# Patient Record
Sex: Male | Born: 1960
Health system: Southern US, Community
[De-identification: ages and names within clinical notes are randomized; demographics above are authoritative.]

## PROBLEM LIST (undated history)

## (undated) DIAGNOSIS — E119 Type 2 diabetes mellitus without complications: Secondary | ICD-10-CM

## (undated) DIAGNOSIS — N189 Chronic kidney disease, unspecified: Secondary | ICD-10-CM

## (undated) DIAGNOSIS — I429 Cardiomyopathy, unspecified: Secondary | ICD-10-CM

## (undated) DIAGNOSIS — A419 Sepsis, unspecified organism: Secondary | ICD-10-CM

## (undated) DIAGNOSIS — W3400XA Accidental discharge from unspecified firearms or gun, initial encounter: Secondary | ICD-10-CM

## (undated) DIAGNOSIS — D649 Anemia, unspecified: Secondary | ICD-10-CM

## (undated) DIAGNOSIS — L97409 Non-pressure chronic ulcer of unspecified heel and midfoot with unspecified severity: Secondary | ICD-10-CM

## (undated) DIAGNOSIS — I10 Essential (primary) hypertension: Secondary | ICD-10-CM

## (undated) DIAGNOSIS — I739 Peripheral vascular disease, unspecified: Secondary | ICD-10-CM

## (undated) DIAGNOSIS — K8020 Calculus of gallbladder without cholecystitis without obstruction: Secondary | ICD-10-CM

## (undated) DIAGNOSIS — M869 Osteomyelitis, unspecified: Secondary | ICD-10-CM

## (undated) DIAGNOSIS — Z89432 Acquired absence of left foot: Secondary | ICD-10-CM

## (undated) DIAGNOSIS — E785 Hyperlipidemia, unspecified: Secondary | ICD-10-CM

## (undated) HISTORY — DX: Hyperlipidemia, unspecified: E78.5

## (undated) HISTORY — PX: OTHER SURGICAL HISTORY: SHX169

## (undated) HISTORY — DX: Sepsis, unspecified organism: A41.9

## (undated) HISTORY — DX: Calculus of gallbladder without cholecystitis without obstruction: K80.20

## (undated) HISTORY — PX: COLOSTOMY CLOSURE: SHX1381

## (undated) HISTORY — DX: Acquired absence of left foot: Z89.432

## (undated) HISTORY — PX: COLOSTOMY: SHX63

---

## 2000-06-03 ENCOUNTER — Inpatient Hospital Stay (HOSPITAL_COMMUNITY): Admission: EM | Admit: 2000-06-03 | Discharge: 2000-06-06 | Payer: Self-pay

## 2000-06-03 ENCOUNTER — Encounter: Payer: Self-pay | Admitting: Emergency Medicine

## 2000-06-04 ENCOUNTER — Encounter: Payer: Self-pay | Admitting: General Surgery

## 2000-06-15 ENCOUNTER — Ambulatory Visit (HOSPITAL_BASED_OUTPATIENT_CLINIC_OR_DEPARTMENT_OTHER): Admission: RE | Admit: 2000-06-15 | Discharge: 2000-06-15 | Payer: Self-pay | Admitting: Specialist

## 2002-01-12 ENCOUNTER — Emergency Department (HOSPITAL_COMMUNITY): Admission: EM | Admit: 2002-01-12 | Discharge: 2002-01-12 | Payer: Self-pay | Admitting: Emergency Medicine

## 2002-01-17 ENCOUNTER — Emergency Department (HOSPITAL_COMMUNITY): Admission: EM | Admit: 2002-01-17 | Discharge: 2002-01-17 | Payer: Self-pay | Admitting: *Deleted

## 2002-01-22 ENCOUNTER — Emergency Department (HOSPITAL_COMMUNITY): Admission: EM | Admit: 2002-01-22 | Discharge: 2002-01-23 | Payer: Self-pay | Admitting: *Deleted

## 2002-01-22 ENCOUNTER — Encounter: Payer: Self-pay | Admitting: *Deleted

## 2002-05-23 ENCOUNTER — Ambulatory Visit (HOSPITAL_COMMUNITY): Admission: RE | Admit: 2002-05-23 | Discharge: 2002-05-23 | Payer: Self-pay | Admitting: Neurology

## 2006-02-17 ENCOUNTER — Emergency Department (HOSPITAL_COMMUNITY): Admission: EM | Admit: 2006-02-17 | Discharge: 2006-02-17 | Payer: Self-pay | Admitting: Emergency Medicine

## 2006-02-21 ENCOUNTER — Emergency Department (HOSPITAL_COMMUNITY): Admission: EM | Admit: 2006-02-21 | Discharge: 2006-02-21 | Payer: Self-pay | Admitting: Emergency Medicine

## 2006-03-02 ENCOUNTER — Inpatient Hospital Stay (HOSPITAL_COMMUNITY): Admission: EM | Admit: 2006-03-02 | Discharge: 2006-03-12 | Payer: Self-pay | Admitting: Emergency Medicine

## 2006-03-02 ENCOUNTER — Ambulatory Visit: Payer: Self-pay | Admitting: Internal Medicine

## 2006-03-17 ENCOUNTER — Ambulatory Visit: Payer: Self-pay | Admitting: Family Medicine

## 2006-04-22 ENCOUNTER — Ambulatory Visit: Payer: Self-pay | Admitting: Sports Medicine

## 2006-06-01 ENCOUNTER — Ambulatory Visit: Payer: Self-pay | Admitting: Sports Medicine

## 2006-07-13 ENCOUNTER — Ambulatory Visit: Payer: Self-pay | Admitting: Internal Medicine

## 2006-07-27 ENCOUNTER — Ambulatory Visit: Payer: Self-pay | Admitting: Internal Medicine

## 2006-08-23 ENCOUNTER — Ambulatory Visit: Payer: Self-pay | Admitting: Internal Medicine

## 2006-08-27 ENCOUNTER — Ambulatory Visit: Payer: Self-pay | Admitting: Internal Medicine

## 2006-09-07 ENCOUNTER — Ambulatory Visit: Payer: Self-pay | Admitting: Internal Medicine

## 2006-09-23 ENCOUNTER — Ambulatory Visit: Payer: Self-pay | Admitting: Internal Medicine

## 2006-11-10 ENCOUNTER — Inpatient Hospital Stay (HOSPITAL_COMMUNITY): Admission: RE | Admit: 2006-11-10 | Discharge: 2006-11-13 | Payer: Self-pay | Admitting: Surgery

## 2006-11-25 DIAGNOSIS — D509 Iron deficiency anemia, unspecified: Secondary | ICD-10-CM

## 2006-11-25 DIAGNOSIS — E1165 Type 2 diabetes mellitus with hyperglycemia: Secondary | ICD-10-CM

## 2006-11-25 DIAGNOSIS — E118 Type 2 diabetes mellitus with unspecified complications: Secondary | ICD-10-CM

## 2012-10-10 ENCOUNTER — Encounter (HOSPITAL_COMMUNITY): Payer: Self-pay

## 2012-10-10 ENCOUNTER — Emergency Department (HOSPITAL_COMMUNITY)
Admission: EM | Admit: 2012-10-10 | Discharge: 2012-10-10 | Disposition: A | Discharge status: Home / Self Care | Payer: BC Managed Care – PPO | Source: Home / Self Care | Attending: Family Medicine | Admitting: Family Medicine

## 2012-10-10 DIAGNOSIS — L97529 Non-pressure chronic ulcer of other part of left foot with unspecified severity: Secondary | ICD-10-CM

## 2012-10-10 DIAGNOSIS — L97509 Non-pressure chronic ulcer of other part of unspecified foot with unspecified severity: Secondary | ICD-10-CM

## 2012-10-10 HISTORY — DX: Accidental discharge from unspecified firearms or gun, initial encounter: W34.00XA

## 2012-10-10 NOTE — ED Notes (Addendum)
Discussed wound care w patient and spouse; provided w basin and betadine for soaks; patient is also to carefully inspect the left shoe for problems, as this may be the source of his foot problem
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
## 2012-10-14 ENCOUNTER — Emergency Department (HOSPITAL_COMMUNITY): Payer: BC Managed Care – PPO

## 2012-10-14 ENCOUNTER — Encounter (HOSPITAL_COMMUNITY): Payer: Self-pay | Admitting: Emergency Medicine

## 2012-10-14 ENCOUNTER — Inpatient Hospital Stay (HOSPITAL_COMMUNITY)
Admission: EM | Admit: 2012-10-14 | Discharge: 2012-10-16 | DRG: 277 | Disposition: A | Discharge status: Home / Self Care | Payer: BC Managed Care – PPO | Attending: Internal Medicine | Admitting: Internal Medicine

## 2012-10-14 DIAGNOSIS — Z23 Encounter for immunization: Secondary | ICD-10-CM

## 2012-10-14 DIAGNOSIS — Z9119 Patient's noncompliance with other medical treatment and regimen: Secondary | ICD-10-CM

## 2012-10-14 DIAGNOSIS — D509 Iron deficiency anemia, unspecified: Secondary | ICD-10-CM | POA: Diagnosis present

## 2012-10-14 DIAGNOSIS — L02619 Cutaneous abscess of unspecified foot: Principal | ICD-10-CM | POA: Diagnosis present

## 2012-10-14 DIAGNOSIS — E11621 Type 2 diabetes mellitus with foot ulcer: Secondary | ICD-10-CM

## 2012-10-14 DIAGNOSIS — A4901 Methicillin susceptible Staphylococcus aureus infection, unspecified site: Secondary | ICD-10-CM | POA: Diagnosis present

## 2012-10-14 DIAGNOSIS — L03119 Cellulitis of unspecified part of limb: Principal | ICD-10-CM | POA: Diagnosis present

## 2012-10-14 DIAGNOSIS — Z91199 Patient's noncompliance with other medical treatment and regimen due to unspecified reason: Secondary | ICD-10-CM

## 2012-10-14 DIAGNOSIS — D638 Anemia in other chronic diseases classified elsewhere: Secondary | ICD-10-CM | POA: Diagnosis present

## 2012-10-14 DIAGNOSIS — E1169 Type 2 diabetes mellitus with other specified complication: Secondary | ICD-10-CM

## 2012-10-14 DIAGNOSIS — IMO0001 Reserved for inherently not codable concepts without codable children: Secondary | ICD-10-CM | POA: Diagnosis present

## 2012-10-14 DIAGNOSIS — Z79899 Other long term (current) drug therapy: Secondary | ICD-10-CM

## 2012-10-14 DIAGNOSIS — E785 Hyperlipidemia, unspecified: Secondary | ICD-10-CM | POA: Diagnosis present

## 2012-10-14 DIAGNOSIS — E119 Type 2 diabetes mellitus without complications: Secondary | ICD-10-CM

## 2012-10-14 DIAGNOSIS — Z9049 Acquired absence of other specified parts of digestive tract: Secondary | ICD-10-CM

## 2012-10-14 DIAGNOSIS — I10 Essential (primary) hypertension: Secondary | ICD-10-CM | POA: Diagnosis present

## 2012-10-14 DIAGNOSIS — L97409 Non-pressure chronic ulcer of unspecified heel and midfoot with unspecified severity: Secondary | ICD-10-CM

## 2012-10-14 HISTORY — DX: Type 2 diabetes mellitus without complications: E11.9

## 2012-10-14 LAB — CBC WITH DIFFERENTIAL/PLATELET
Eosinophils Relative: 1 % (ref 0–5)
HCT: 31.3 % — ABNORMAL LOW (ref 39.0–52.0)
Hemoglobin: 9.9 g/dL — ABNORMAL LOW (ref 13.0–17.0)
Lymphocytes Relative: 26 % (ref 12–46)
Lymphs Abs: 2.3 10*3/uL (ref 0.7–4.0)
MCV: 75.1 fL — ABNORMAL LOW (ref 78.0–100.0)
Monocytes Relative: 8 % (ref 3–12)
Platelets: 364 10*3/uL (ref 150–400)
RBC: 4.17 MIL/uL — ABNORMAL LOW (ref 4.22–5.81)
WBC: 8.9 10*3/uL (ref 4.0–10.5)

## 2012-10-14 LAB — BASIC METABOLIC PANEL
CO2: 29 mEq/L (ref 19–32)
Calcium: 9.6 mg/dL (ref 8.4–10.5)
Glucose, Bld: 306 mg/dL — ABNORMAL HIGH (ref 70–99)
Sodium: 135 mEq/L (ref 135–145)

## 2012-10-14 LAB — GLUCOSE, CAPILLARY: Glucose-Capillary: 370 mg/dL — ABNORMAL HIGH (ref 70–99)

## 2012-10-14 MED ORDER — SODIUM CHLORIDE 0.9 % IV SOLN
Freq: Once | INTRAVENOUS | Status: AC
  Administered 2012-10-14: via INTRAVENOUS

## 2012-10-14 MED ORDER — VANCOMYCIN HCL IN DEXTROSE 1-5 GM/200ML-% IV SOLN
1000.0000 mg | Freq: Once | INTRAVENOUS | Status: AC
  Administered 2012-10-14: 1000 mg via INTRAVENOUS
  Filled 2012-10-14: qty 200
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
## 2012-10-14 NOTE — ED Notes (Signed)
Pt here with ulceration to left heel that is having increasing purulent discharge; pt sent here for eval and CBG in 300's at PCP office

## 2012-10-14 NOTE — ED Notes (Signed)
Pt seated in chair with family beside her. NAD at this time

## 2012-10-15 LAB — LIPID PANEL
Cholesterol: 184 mg/dL (ref 0–200)
LDL Cholesterol: 114 mg/dL — ABNORMAL HIGH (ref 0–99)
Total CHOL/HDL Ratio: 3.3 RATIO
VLDL: 15 mg/dL (ref 0–40)

## 2012-10-15 LAB — BASIC METABOLIC PANEL
BUN: 9 mg/dL (ref 6–23)
Calcium: 9.1 mg/dL (ref 8.4–10.5)
Creatinine, Ser: 0.55 mg/dL (ref 0.50–1.35)
GFR calc Af Amer: >90 mL/min (ref >90)
GFR calc non Af Amer: >90 mL/min (ref >90)

## 2012-10-15 LAB — RAPID URINE DRUG SCREEN, HOSP PERFORMED
Barbiturates: NOT DETECTED
Benzodiazepines: NOT DETECTED
Cocaine: NOT DETECTED
Opiates: NOT DETECTED

## 2012-10-15 LAB — CBC
HCT: 29.8 % — ABNORMAL LOW (ref 39.0–52.0)
MCHC: 31.5 g/dL (ref 30.0–36.0)
Platelets: 312 10*3/uL (ref 150–400)
RDW: 12.8 % (ref 11.5–15.5)
WBC: 6.9 10*3/uL (ref 4.0–10.5)

## 2012-10-15 LAB — URINE MICROSCOPIC-ADD ON

## 2012-10-15 LAB — GLUCOSE, CAPILLARY
Glucose-Capillary: 213 mg/dL — ABNORMAL HIGH (ref 70–99)
Glucose-Capillary: 225 mg/dL — ABNORMAL HIGH (ref 70–99)
Glucose-Capillary: 380 mg/dL — ABNORMAL HIGH (ref 70–99)

## 2012-10-15 LAB — URINALYSIS, ROUTINE W REFLEX MICROSCOPIC
Glucose, UA: >1000 mg/dL — AB
Hgb urine dipstick: NEGATIVE
Protein, ur: NEGATIVE mg/dL
Specific Gravity, Urine: 1.022 (ref 1.005–1.030)

## 2012-10-15 MED ORDER — ONDANSETRON HCL 8 MG PO TABS: 4.0000 mg | ORAL_TABLET | Freq: Four times a day (QID) | ORAL | Status: DC | PRN

## 2012-10-15 MED ORDER — GLIPIZIDE 10 MG PO TABS
10.0000 mg | ORAL_TABLET | Freq: Two times a day (BID) | ORAL | Status: DC
  Administered 2012-10-15 – 2012-10-16 (×3): 10 mg via ORAL
  Filled 2012-10-15 (×4): qty 1

## 2012-10-15 MED ORDER — HYDROCODONE-ACETAMINOPHEN 5-325 MG PO TABS
1.0000 | ORAL_TABLET | ORAL | Status: DC | PRN
  Administered 2012-10-15: 2 via ORAL
  Filled 2012-10-15: qty 2

## 2012-10-15 MED ORDER — INSULIN ASPART 100 UNIT/ML ~~LOC~~ SOLN
0.0000 [IU] | SUBCUTANEOUS | Status: DC
  Administered 2012-10-15: 9 [IU] via SUBCUTANEOUS
  Administered 2012-10-15: 3 [IU] via SUBCUTANEOUS
  Administered 2012-10-15: 5 [IU] via SUBCUTANEOUS
  Administered 2012-10-15: 3 [IU] via SUBCUTANEOUS
  Administered 2012-10-15: 5 [IU] via SUBCUTANEOUS
  Administered 2012-10-16: 1 [IU] via SUBCUTANEOUS
  Filled 2012-10-15: qty 1

## 2012-10-15 MED ORDER — ONDANSETRON HCL 4 MG/2ML IJ SOLN: 4.0000 mg | Freq: Four times a day (QID) | INTRAMUSCULAR | Status: DC | PRN

## 2012-10-15 MED ORDER — SODIUM CHLORIDE 0.9 % IV SOLN: INTRAVENOUS | Status: AC

## 2012-10-15 MED ORDER — LISINOPRIL 10 MG PO TABS
10.0000 mg | ORAL_TABLET | Freq: Every day | ORAL | Status: DC
  Administered 2012-10-15 – 2012-10-16 (×2): 10 mg via ORAL
  Filled 2012-10-15 (×2): qty 1

## 2012-10-15 MED ORDER — INFLUENZA VIRUS VACC SPLIT PF IM SUSP
0.5000 mL | INTRAMUSCULAR | Status: AC
  Administered 2012-10-16: 0.5 mL via INTRAMUSCULAR
  Filled 2012-10-15: qty 0.5

## 2012-10-15 MED ORDER — ONDANSETRON HCL 4 MG PO TABS
4.0000 mg | ORAL_TABLET | Freq: Four times a day (QID) | ORAL | Status: DC | PRN
  Filled 2012-10-15: qty 1

## 2012-10-15 MED ORDER — MORPHINE SULFATE 2 MG/ML IJ SOLN: 2.0000 mg | INTRAMUSCULAR | Status: DC | PRN

## 2012-10-15 MED ORDER — SODIUM CHLORIDE 0.9 % IV SOLN: INTRAVENOUS | Status: DC

## 2012-10-15 MED ORDER — ENOXAPARIN SODIUM 30 MG/0.3ML ~~LOC~~ SOLN
30.0000 mg | SUBCUTANEOUS | Status: DC
  Administered 2012-10-15 – 2012-10-16 (×2): 30 mg via SUBCUTANEOUS
  Filled 2012-10-15 (×2): qty 0.3

## 2012-10-15 MED ORDER — VANCOMYCIN HCL 10 G IV SOLR
1250.0000 mg | Freq: Two times a day (BID) | INTRAVENOUS | Status: DC
  Administered 2012-10-15 – 2012-10-16 (×3): 1250 mg via INTRAVENOUS
  Filled 2012-10-15 (×4): qty 1250
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
## 2012-10-15 NOTE — Progress Notes (Signed)
Pt needs better follow up for DM 2 reports pt. Previously was on metformin.
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
## 2012-10-16 ENCOUNTER — Inpatient Hospital Stay (HOSPITAL_COMMUNITY): Payer: BC Managed Care – PPO

## 2012-10-16 LAB — BASIC METABOLIC PANEL
CO2: 26 mEq/L (ref 19–32)
Chloride: 99 mEq/L (ref 96–112)
Glucose, Bld: 143 mg/dL — ABNORMAL HIGH (ref 70–99)
Sodium: 134 mEq/L — ABNORMAL LOW (ref 135–145)

## 2012-10-16 LAB — GLUCOSE, CAPILLARY
Glucose-Capillary: 146 mg/dL — ABNORMAL HIGH (ref 70–99)
Glucose-Capillary: 338 mg/dL — ABNORMAL HIGH (ref 70–99)
Glucose-Capillary: 222 mg/dL — ABNORMAL HIGH (ref 70–99)

## 2012-10-16 LAB — CBC
Hemoglobin: 9.2 g/dL — ABNORMAL LOW (ref 13.0–17.0)
MCV: 74.8 fL — ABNORMAL LOW (ref 78.0–100.0)
Platelets: 350 10*3/uL (ref 150–400)
RBC: 3.89 MIL/uL — ABNORMAL LOW (ref 4.22–5.81)
WBC: 7.6 10*3/uL (ref 4.0–10.5)

## 2012-10-16 MED ORDER — INSULIN ASPART 100 UNIT/ML ~~LOC~~ SOLN
0.0000 [IU] | Freq: Three times a day (TID) | SUBCUTANEOUS | Status: DC
  Administered 2012-10-16: 2 [IU] via SUBCUTANEOUS
  Administered 2012-10-16: 5 [IU] via SUBCUTANEOUS

## 2012-10-16 MED ORDER — METFORMIN HCL 500 MG PO TABS: 500.0000 mg | ORAL_TABLET | Freq: Two times a day (BID) | ORAL | Status: DC

## 2012-10-16 MED ORDER — DOXYCYCLINE HYCLATE 100 MG PO TABS
100.0000 mg | ORAL_TABLET | Freq: Two times a day (BID) | ORAL | Status: DC
  Administered 2012-10-16: 100 mg via ORAL
  Filled 2012-10-16 (×2): qty 1

## 2012-10-16 MED ORDER — INSULIN ASPART 100 UNIT/ML ~~LOC~~ SOLN: 0.0000 [IU] | Freq: Every day | SUBCUTANEOUS | Status: DC

## 2012-10-16 MED ORDER — METFORMIN HCL 500 MG PO TABS
500.0000 mg | ORAL_TABLET | Freq: Two times a day (BID) | ORAL | Status: DC
  Administered 2012-10-16: 500 mg via ORAL
  Filled 2012-10-16 (×2): qty 1

## 2012-10-16 MED ORDER — DOXYCYCLINE HYCLATE 100 MG PO TABS: 100.0000 mg | ORAL_TABLET | Freq: Two times a day (BID) | ORAL | Status: DC

## 2012-10-16 MED ORDER — LISINOPRIL 10 MG PO TABS: 10.0000 mg | ORAL_TABLET | Freq: Every day | ORAL | Status: DC

## 2012-10-16 MED ORDER — GLIPIZIDE 10 MG PO TABS: 10.0000 mg | ORAL_TABLET | Freq: Two times a day (BID) | ORAL | Status: DC

## 2012-10-16 MED ORDER — HYDROCODONE-ACETAMINOPHEN 5-325 MG PO TABS: 1.0000 | ORAL_TABLET | ORAL | Status: DC | PRN

## 2012-10-16 MED ORDER — GADOBENATE DIMEGLUMINE 529 MG/ML IV SOLN
15.0000 mL | Freq: Once | INTRAVENOUS | Status: AC | PRN
  Administered 2012-10-16: 15 mL via INTRAVENOUS
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
## 2012-10-17 LAB — WOUND CULTURE: Gram Stain: NONE SEEN

## 2012-10-19 NOTE — Progress Notes (Signed)
Utilization review completed. Helem Reesor, RN, BSN. 

## 2012-10-21 ENCOUNTER — Encounter (HOSPITAL_BASED_OUTPATIENT_CLINIC_OR_DEPARTMENT_OTHER): Payer: BC Managed Care – PPO | Attending: General Surgery

## 2012-10-21 DIAGNOSIS — M729 Fibroblastic disorder, unspecified: Secondary | ICD-10-CM | POA: Insufficient documentation

## 2012-10-21 DIAGNOSIS — L97409 Non-pressure chronic ulcer of unspecified heel and midfoot with unspecified severity: Secondary | ICD-10-CM | POA: Insufficient documentation

## 2012-10-21 DIAGNOSIS — Z79899 Other long term (current) drug therapy: Secondary | ICD-10-CM | POA: Insufficient documentation

## 2012-10-21 DIAGNOSIS — E1169 Type 2 diabetes mellitus with other specified complication: Secondary | ICD-10-CM | POA: Insufficient documentation

## 2012-10-24 ENCOUNTER — Other Ambulatory Visit (HOSPITAL_COMMUNITY): Payer: Self-pay | Admitting: General Surgery

## 2012-10-24 DIAGNOSIS — I739 Peripheral vascular disease, unspecified: Secondary | ICD-10-CM

## 2012-10-24 DIAGNOSIS — T148XXA Other injury of unspecified body region, initial encounter: Secondary | ICD-10-CM

## 2012-10-24 NOTE — Progress Notes (Signed)
[facility]  NAME:  Keith Guzman, Keith Guzman                        ACCOUNT NO.:  MEDICAL RECORD NO.:  [id number]      DATE OF BIRTH:  12/12/1960  PHYSICIAN:  Ardath Sax, M.D.           VISIT DATE:                                  OFFICE VISIT   This is a 52 year old male who comes to [place] because of a diabetic foot ulcer on his left heel that is about 1.5 cm in diameter.  He says it has only been present for couple of weeks.  He has type 2 diabetes, and takes glipizide and metformin.  His blood sugar today was 249.  His temperature is 98.6, pulse 100, respirations 18, blood pressure 150/91. This gentleman had an MRI of his left foot which did not show any evidence of osteomyelitis.  He really does has some fasciitis of the heel. I debrided it and we are treating it with Santyl and Darco boot to keep his weight off of this ulcer.  He may even be a candidate later for a EasyCast and maybe even hyperbaric oxygen.  I am classifying this ulcer as a Wagner III, and after I aggressively debrided it, we put on Santyl and I wrote him a prescription for Santyl. I will see him back here in a week.  DIAGNOSES: 1. Diabetic foot ulcer Wagner III, left heel 1.5 cm in diameter. 2. Type 2 diabetes.     Ardath Sax, M.D.     PP/MEDQ  D:  10/21/2012  T:  10/22/2012  Job:  [id number]

## 2012-11-01 ENCOUNTER — Ambulatory Visit (HOSPITAL_COMMUNITY)
Admission: RE | Admit: 2012-11-01 | Discharge: 2012-11-01 | Disposition: A | Discharge status: Home / Self Care | Payer: BC Managed Care – PPO | Source: Ambulatory Visit | Attending: Cardiovascular Disease | Admitting: Cardiovascular Disease

## 2012-11-01 DIAGNOSIS — T148XXA Other injury of unspecified body region, initial encounter: Secondary | ICD-10-CM

## 2012-11-01 DIAGNOSIS — I739 Peripheral vascular disease, unspecified: Secondary | ICD-10-CM

## 2012-11-01 DIAGNOSIS — T07XXXA Unspecified multiple injuries, initial encounter: Secondary | ICD-10-CM | POA: Insufficient documentation

## 2012-11-01 NOTE — Progress Notes (Signed)
Bilateral lower ext. Arterial duplex completed. Keith Guzman

## 2012-11-02 ENCOUNTER — Ambulatory Visit (HOSPITAL_COMMUNITY)
Admission: RE | Admit: 2012-11-02 | Discharge: 2012-11-02 | Disposition: A | Discharge status: Home / Self Care | Payer: BC Managed Care – PPO | Source: Ambulatory Visit | Attending: Cardiovascular Disease | Admitting: Cardiovascular Disease

## 2012-11-02 DIAGNOSIS — I739 Peripheral vascular disease, unspecified: Secondary | ICD-10-CM | POA: Insufficient documentation

## 2012-11-02 DIAGNOSIS — X58XXXA Exposure to other specified factors, initial encounter: Secondary | ICD-10-CM | POA: Insufficient documentation

## 2012-11-02 DIAGNOSIS — T148XXA Other injury of unspecified body region, initial encounter: Secondary | ICD-10-CM

## 2012-11-02 DIAGNOSIS — T07XXXA Unspecified multiple injuries, initial encounter: Secondary | ICD-10-CM | POA: Insufficient documentation

## 2012-11-02 NOTE — Progress Notes (Signed)
Bilateral lower extremity venous duplex and Doppler complete. Keith Guzman

## 2012-11-04 ENCOUNTER — Encounter (HOSPITAL_BASED_OUTPATIENT_CLINIC_OR_DEPARTMENT_OTHER): Payer: BC Managed Care – PPO | Attending: General Surgery

## 2012-11-04 DIAGNOSIS — L97409 Non-pressure chronic ulcer of unspecified heel and midfoot with unspecified severity: Secondary | ICD-10-CM | POA: Insufficient documentation

## 2012-11-04 DIAGNOSIS — E1069 Type 1 diabetes mellitus with other specified complication: Secondary | ICD-10-CM | POA: Insufficient documentation

## 2012-11-16 ENCOUNTER — Other Ambulatory Visit (HOSPITAL_BASED_OUTPATIENT_CLINIC_OR_DEPARTMENT_OTHER): Payer: Self-pay | Admitting: General Surgery

## 2012-11-16 ENCOUNTER — Ambulatory Visit (HOSPITAL_COMMUNITY)
Admission: RE | Admit: 2012-11-16 | Discharge: 2012-11-16 | Disposition: A | Discharge status: Home / Self Care | Payer: BC Managed Care – PPO | Source: Ambulatory Visit | Attending: General Surgery | Admitting: General Surgery

## 2012-11-16 DIAGNOSIS — Z87891 Personal history of nicotine dependence: Secondary | ICD-10-CM | POA: Insufficient documentation

## 2012-11-16 DIAGNOSIS — L97409 Non-pressure chronic ulcer of unspecified heel and midfoot with unspecified severity: Secondary | ICD-10-CM | POA: Insufficient documentation

## 2012-11-16 DIAGNOSIS — I10 Essential (primary) hypertension: Secondary | ICD-10-CM | POA: Insufficient documentation

## 2012-11-16 DIAGNOSIS — E1169 Type 2 diabetes mellitus with other specified complication: Secondary | ICD-10-CM | POA: Insufficient documentation

## 2012-11-17 NOTE — Progress Notes (Signed)
[facility]  NAMETUCK, DULWORTH NO.:  [id number]  MEDICAL RECORD NO.:  [id number]      DATE OF BIRTH:  1961-06-14  PHYSICIAN:  Ardath Sax, M.D.           VISIT DATE:                                  OFFICE VISIT   Mr. Werber is a very nice, very hard working 52 year old gentleman who has juvenile diabetes.  He has had a great problem with a diabetic foot ulcer on his left heel.  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  I have been taking care of him for about a month and a half, and I have been debriding this ulcer, which is about 2 cm in diameter, and treating it with Santyl and a off-loading boot.  He has been complying extremely well and, I believe, I would like to continue doing debridements and treating it with Santyl, and hopefully pretty soon either with a Dermagraft or an Apligraf.  In the meantime, I would like to apply for hyperbaric oxygen treatments as this is a deep diabetic foot ulcer, and I feel that the combination of the antibiotics, debridement, and later on a Dermagraft or Apligraf would help all along with the stimulus of hyperbaric oxygen treatments, so I would like to apply for permission to use this in his treatment.     Ardath Sax, M.D.     PP/MEDQ  D:  11/16/2012  T:  11/17/2012  Job:  [id number]

## 2012-11-18 ENCOUNTER — Encounter (HOSPITAL_COMMUNITY): Payer: Self-pay | Admitting: Pharmacy Technician

## 2012-11-25 ENCOUNTER — Other Ambulatory Visit: Payer: Self-pay | Admitting: *Deleted

## 2012-11-26 DIAGNOSIS — L97409 Non-pressure chronic ulcer of unspecified heel and midfoot with unspecified severity: Secondary | ICD-10-CM

## 2012-11-26 DIAGNOSIS — I739 Peripheral vascular disease, unspecified: Secondary | ICD-10-CM

## 2012-11-26 HISTORY — DX: Non-pressure chronic ulcer of unspecified heel and midfoot with unspecified severity: L97.409

## 2012-11-26 HISTORY — DX: Peripheral vascular disease, unspecified: I73.9

## 2012-11-28 HISTORY — PX: ANGIOPLASTY / STENTING FEMORAL: SUR30

## 2012-11-29 ENCOUNTER — Encounter (HOSPITAL_COMMUNITY): Payer: Self-pay | Admitting: *Deleted

## 2012-11-29 ENCOUNTER — Ambulatory Visit (HOSPITAL_COMMUNITY)
Admission: RE | Admit: 2012-11-29 | Discharge: 2012-11-30 | Disposition: A | Discharge status: Home / Self Care | Payer: BC Managed Care – PPO | Source: Ambulatory Visit | Attending: Cardiovascular Disease | Admitting: Cardiovascular Disease

## 2012-11-29 ENCOUNTER — Other Ambulatory Visit: Payer: Self-pay | Admitting: Physician Assistant

## 2012-11-29 ENCOUNTER — Encounter (HOSPITAL_COMMUNITY)
Admission: RE | Disposition: A | Discharge status: Home / Self Care | Payer: Self-pay | Source: Ambulatory Visit | Attending: Cardiovascular Disease

## 2012-11-29 DIAGNOSIS — Z87891 Personal history of nicotine dependence: Secondary | ICD-10-CM | POA: Insufficient documentation

## 2012-11-29 DIAGNOSIS — E119 Type 2 diabetes mellitus without complications: Secondary | ICD-10-CM | POA: Insufficient documentation

## 2012-11-29 DIAGNOSIS — I739 Peripheral vascular disease, unspecified: Secondary | ICD-10-CM

## 2012-11-29 DIAGNOSIS — L98499 Non-pressure chronic ulcer of skin of other sites with unspecified severity: Secondary | ICD-10-CM | POA: Insufficient documentation

## 2012-11-29 DIAGNOSIS — L97409 Non-pressure chronic ulcer of unspecified heel and midfoot with unspecified severity: Secondary | ICD-10-CM | POA: Insufficient documentation

## 2012-11-29 DIAGNOSIS — I10 Essential (primary) hypertension: Secondary | ICD-10-CM | POA: Insufficient documentation

## 2012-11-29 DIAGNOSIS — D509 Iron deficiency anemia, unspecified: Secondary | ICD-10-CM | POA: Insufficient documentation

## 2012-11-29 DIAGNOSIS — I498 Other specified cardiac arrhythmias: Secondary | ICD-10-CM | POA: Insufficient documentation

## 2012-11-29 HISTORY — DX: Peripheral vascular disease, unspecified: I73.9

## 2012-11-29 HISTORY — PX: LOWER EXTREMITY ANGIOGRAM: SHX5508

## 2012-11-29 HISTORY — DX: Essential (primary) hypertension: I10

## 2012-11-29 HISTORY — DX: Non-pressure chronic ulcer of unspecified heel and midfoot with unspecified severity: L97.409

## 2012-11-29 HISTORY — PX: ATHERECTOMY: SHX5502

## 2012-11-29 LAB — GLUCOSE, CAPILLARY: Glucose-Capillary: 98 mg/dL (ref 70–99)

## 2012-11-29 SURGERY — ATHERECTOMY
Anesthesia: LOCAL

## 2012-11-29 MED ORDER — SODIUM CHLORIDE 0.9 % IJ SOLN: 3.0000 mL | INTRAMUSCULAR | Status: DC | PRN

## 2012-11-29 MED ORDER — INSULIN ASPART 100 UNIT/ML ~~LOC~~ SOLN
0.0000 [IU] | Freq: Every day | SUBCUTANEOUS | Status: DC
  Administered 2012-11-29: 22:00:00 3 [IU] via SUBCUTANEOUS

## 2012-11-29 MED ORDER — DIAZEPAM 5 MG PO TABS
5.0000 mg | ORAL_TABLET | ORAL | Status: AC
  Administered 2012-11-29: 5 mg via ORAL
  Filled 2012-11-29: qty 1

## 2012-11-29 MED ORDER — HEPARIN SODIUM (PORCINE) 1000 UNIT/ML IJ SOLN
INTRAMUSCULAR | Status: AC
  Filled 2012-11-29: qty 1

## 2012-11-29 MED ORDER — LIDOCAINE HCL (PF) 1 % IJ SOLN
INTRAMUSCULAR | Status: AC
  Filled 2012-11-29: qty 30

## 2012-11-29 MED ORDER — LISINOPRIL 10 MG PO TABS
10.0000 mg | ORAL_TABLET | Freq: Every day | ORAL | Status: DC
  Administered 2012-11-29 – 2012-11-30 (×2): 10 mg via ORAL
  Filled 2012-11-29 (×2): qty 1

## 2012-11-29 MED ORDER — GLIPIZIDE 10 MG PO TABS
10.0000 mg | ORAL_TABLET | Freq: Two times a day (BID) | ORAL | Status: DC
  Administered 2012-11-29 – 2012-11-30 (×2): 10 mg via ORAL
  Filled 2012-11-29 (×4): qty 1

## 2012-11-29 MED ORDER — ASPIRIN EC 81 MG PO TBEC
81.0000 mg | DELAYED_RELEASE_TABLET | Freq: Every day | ORAL | Status: DC
  Filled 2012-11-29: qty 1

## 2012-11-29 MED ORDER — GLIPIZIDE 10 MG PO TABS
10.0000 mg | ORAL_TABLET | Freq: Two times a day (BID) | ORAL | Status: DC
  Filled 2012-11-29: qty 1

## 2012-11-29 MED ORDER — ASPIRIN EC 81 MG PO TBEC: 81.0000 mg | DELAYED_RELEASE_TABLET | Freq: Every day | ORAL | Status: DC

## 2012-11-29 MED ORDER — MIDAZOLAM HCL 2 MG/2ML IJ SOLN
INTRAMUSCULAR | Status: AC
  Filled 2012-11-29: qty 2

## 2012-11-29 MED ORDER — ASPIRIN EC 325 MG PO TBEC: 325.0000 mg | DELAYED_RELEASE_TABLET | Freq: Every day | ORAL | Status: DC

## 2012-11-29 MED ORDER — ASPIRIN 81 MG PO CHEW
324.0000 mg | CHEWABLE_TABLET | Freq: Once | ORAL | Status: AC
  Administered 2012-11-29: 324 mg via ORAL

## 2012-11-29 MED ORDER — ACETAMINOPHEN 325 MG PO TABS: 650.0000 mg | ORAL_TABLET | ORAL | Status: DC | PRN

## 2012-11-29 MED ORDER — ASPIRIN 81 MG PO CHEW
CHEWABLE_TABLET | ORAL | Status: AC
  Filled 2012-11-29: qty 4

## 2012-11-29 MED ORDER — ONDANSETRON HCL 4 MG/2ML IJ SOLN: 4.0000 mg | Freq: Four times a day (QID) | INTRAMUSCULAR | Status: DC | PRN

## 2012-11-29 MED ORDER — MORPHINE SULFATE 2 MG/ML IJ SOLN: 2.0000 mg | INTRAMUSCULAR | Status: DC | PRN

## 2012-11-29 MED ORDER — HYDRALAZINE HCL 20 MG/ML IJ SOLN
10.0000 mg | Freq: Once | INTRAMUSCULAR | Status: AC
  Administered 2012-11-29: 10 mg via INTRAVENOUS

## 2012-11-29 MED ORDER — HYDRALAZINE HCL 20 MG/ML IJ SOLN
INTRAMUSCULAR | Status: AC
  Filled 2012-11-29: qty 1

## 2012-11-29 MED ORDER — SODIUM CHLORIDE 0.9 % IV SOLN
INTRAVENOUS | Status: AC
  Administered 2012-11-29: 18:00:00 via INTRAVENOUS

## 2012-11-29 MED ORDER — HYDROCODONE-ACETAMINOPHEN 5-325 MG PO TABS
1.0000 | ORAL_TABLET | ORAL | Status: DC | PRN
  Administered 2012-11-30: 1 via ORAL
  Filled 2012-11-29: qty 1

## 2012-11-29 MED ORDER — FENTANYL CITRATE 0.05 MG/ML IJ SOLN
INTRAMUSCULAR | Status: AC
  Filled 2012-11-29: qty 2

## 2012-11-29 MED ORDER — SODIUM CHLORIDE 0.9 % IV SOLN
INTRAVENOUS | Status: DC
  Administered 2012-11-29: 12:00:00 via INTRAVENOUS

## 2012-11-29 MED ORDER — METOPROLOL TARTRATE 25 MG PO TABS
25.0000 mg | ORAL_TABLET | Freq: Once | ORAL | Status: AC
  Administered 2012-11-29: 21:00:00 25 mg via ORAL
  Filled 2012-11-29: qty 1

## 2012-11-29 MED ORDER — METOPROLOL TARTRATE 1 MG/ML IV SOLN
2.5000 mg | Freq: Once | INTRAVENOUS | Status: AC
  Administered 2012-11-29: 19:00:00 2.5 mg via INTRAVENOUS
  Filled 2012-11-29: qty 5

## 2012-11-29 MED ORDER — INSULIN ASPART 100 UNIT/ML ~~LOC~~ SOLN
0.0000 [IU] | Freq: Three times a day (TID) | SUBCUTANEOUS | Status: DC
  Administered 2012-11-30: 09:00:00 3 [IU] via SUBCUTANEOUS

## 2012-11-29 MED ORDER — DOXYCYCLINE HYCLATE 100 MG PO TABS
100.0000 mg | ORAL_TABLET | Freq: Two times a day (BID) | ORAL | Status: DC
  Administered 2012-11-29 – 2012-11-30 (×2): 100 mg via ORAL
  Filled 2012-11-29 (×3): qty 1

## 2012-11-29 NOTE — Progress Notes (Signed)
  Left lower extremity arterial duplex US order for OP procedure entered.  HAGER, BRYAN 4:17 PM

## 2012-11-29 NOTE — H&P (Signed)
  H & P will be scanned in.  Pt was reexamined and existing H & P reviewed. No changes found.  Runell Gess, MD [facility] 11/29/2012 1:53 PM
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
## 2012-11-30 ENCOUNTER — Encounter (HOSPITAL_COMMUNITY): Payer: Self-pay | Admitting: Cardiology

## 2012-11-30 DIAGNOSIS — R000 Tachycardia, unspecified: Secondary | ICD-10-CM | POA: Diagnosis present

## 2012-11-30 DIAGNOSIS — L97521 Non-pressure chronic ulcer of other part of left foot limited to breakdown of skin: Secondary | ICD-10-CM | POA: Diagnosis present

## 2012-11-30 DIAGNOSIS — I739 Peripheral vascular disease, unspecified: Secondary | ICD-10-CM | POA: Diagnosis present

## 2012-11-30 DIAGNOSIS — Z87891 Personal history of nicotine dependence: Secondary | ICD-10-CM

## 2012-11-30 DIAGNOSIS — I10 Essential (primary) hypertension: Secondary | ICD-10-CM | POA: Diagnosis present

## 2012-11-30 LAB — TSH: TSH: 1.871 u[IU]/mL (ref 0.350–4.500)

## 2012-11-30 LAB — CBC
HCT: 25.7 % — ABNORMAL LOW (ref 39.0–52.0)
MCH: 23.7 pg — ABNORMAL LOW (ref 26.0–34.0)
MCV: 71.6 fL — ABNORMAL LOW (ref 78.0–100.0)
Platelets: 368 10*3/uL (ref 150–400)
RDW: 12.9 % (ref 11.5–15.5)

## 2012-11-30 LAB — IRON AND TIBC
Iron: 24 ug/dL — ABNORMAL LOW (ref 42–135)
Saturation Ratios: 9 % — ABNORMAL LOW (ref 20–55)
TIBC: 260 ug/dL (ref 215–435)
UIBC: 236 ug/dL (ref 125–400)

## 2012-11-30 LAB — FERRITIN: Ferritin: 200 ng/mL (ref 22–322)

## 2012-11-30 LAB — BASIC METABOLIC PANEL
BUN: 11 mg/dL (ref 6–23)
CO2: 27 mEq/L (ref 19–32)
Calcium: 9.2 mg/dL (ref 8.4–10.5)
Chloride: 101 mEq/L (ref 96–112)
Creatinine, Ser: 0.65 mg/dL (ref 0.50–1.35)
Glucose, Bld: 188 mg/dL — ABNORMAL HIGH (ref 70–99)

## 2012-11-30 MED ORDER — ASPIRIN 162 MG PO TBEC: 162.0000 mg | DELAYED_RELEASE_TABLET | Freq: Every day | ORAL | Status: DC

## 2012-11-30 MED ORDER — SIMVASTATIN 20 MG PO TABS: 20.0000 mg | ORAL_TABLET | Freq: Every day | ORAL | Status: DC

## 2012-11-30 MED ORDER — SIMVASTATIN 20 MG PO TABS
20.0000 mg | ORAL_TABLET | Freq: Every day | ORAL | Status: DC
  Filled 2012-11-30: qty 1

## 2012-11-30 MED ORDER — PANTOPRAZOLE SODIUM 40 MG PO TBEC: 40.0000 mg | DELAYED_RELEASE_TABLET | Freq: Every day | ORAL | Status: DC

## 2012-11-30 MED ORDER — METOPROLOL TARTRATE 25 MG PO TABS
25.0000 mg | ORAL_TABLET | Freq: Two times a day (BID) | ORAL | Status: DC
  Administered 2012-11-30: 25 mg via ORAL
  Filled 2012-11-30: qty 1

## 2012-11-30 MED ORDER — METOPROLOL TARTRATE 25 MG PO TABS: 25.0000 mg | ORAL_TABLET | Freq: Two times a day (BID) | ORAL | Status: DC

## 2012-11-30 MED ORDER — ASPIRIN EC 81 MG PO TBEC: 162.0000 mg | DELAYED_RELEASE_TABLET | Freq: Every day | ORAL | Status: DC

## 2012-11-30 MED ORDER — LISINOPRIL 10 MG PO TABS: 20.0000 mg | ORAL_TABLET | Freq: Every day | ORAL | Status: DC

## 2012-11-30 MED ORDER — METFORMIN HCL 500 MG PO TABS: 500.0000 mg | ORAL_TABLET | Freq: Two times a day (BID) | ORAL | Status: DC

## 2012-11-30 MED ORDER — PANTOPRAZOLE SODIUM 40 MG PO TBEC
40.0000 mg | DELAYED_RELEASE_TABLET | Freq: Every day | ORAL | Status: DC
  Administered 2012-11-30: 40 mg via ORAL
  Filled 2012-11-30: qty 1

## 2012-11-30 MED ORDER — LISINOPRIL 20 MG PO TABS: 20.0000 mg | ORAL_TABLET | Freq: Every day | ORAL | Status: DC

## 2012-11-30 NOTE — Consult Note (Signed)
WOC consult Note Reason for Consult:Consult requested for left foot wounds.  Pt is followed by the outpatient wound care center prior to admission and has just received revascularization therapy to promote healing to wounds.  Wound care center has ordered BID Santyl to left foot wounds to provide chemical debridement.  Left posterior great toe with full thickness wound, .5X2X.3cm, bone palpable with swab,100% slough, mod tan drainage, some strong odor.  Wound type: Full thickness to left heel Measurement:2X1X.5cm Wound bed: 50% red, 50% slough Drainage (amount, consistency, odor) Mod tan drainage, some strong odor Periwound: Callous surrounding wound, pt states this is debrided at the outpatient wound care center. Dressing procedure/placement/frequency:  Pt states he will be discharged soon and will apply Santyl as ordered once he is at home, so another tube is not ordered at this time.  Concerned that wounds are becoming too dry since dressing has not been changed since yesterday.  Removed previous dressing, applied hydrogel to provide moisture, and re-applied dressings until pt can change at home. He will resume previous plan of care and follow-up with the outpatient wound care center after discharge. Will not plan to follow further unless re-consulted.  [address], RN, MSN, [employer]  [phone]
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
## 2012-11-30 NOTE — Progress Notes (Signed)
Subjective:  No complaints  Objective:  Vital Signs in the last 24 hours: Temp:  [97.7 F (36.5 C)-99.8 F (37.7 C)] 98.6 F (37 C) (03/05 0755) Pulse Rate:  [105-132] 110 (03/05 0536) Resp:  [20] 20 (03/04 1109) BP: (115-176)/(69-92) 115/69 mmHg (03/05 0536) SpO2:  [98 %-100 %] 100 % (03/05 0536) Weight:  [72.6 kg (160 lb 0.9 oz)-74.844 kg (165 lb)] 72.6 kg (160 lb 0.9 oz) (03/05 0028)  Intake/Output from previous day:  Intake/Output Summary (Last 24 hours) at 11/30/12 0956 Last data filed at 11/30/12 0600  Gross per 24 hour  Intake   1300 ml  Output    300 ml  Net   1000 ml    Physical Exam: General appearance: alert, cooperative and no distress Lungs: clear to auscultation bilaterally Heart: RRR, increased rate RT groin a little echymosis, no hematoma   Rate: 115  Rhythm: sinus tachycardia  Lab Results:  Recent Labs  11/30/12 0455  WBC 7.3  HGB 8.5*  PLT 368    Recent Labs  11/30/12 0455  NA 136  K 3.9  CL 101  CO2 27  GLUCOSE 188*  BUN 11  CREATININE 0.65   No results found for this basename: TROPONINI, CK, MB,  in the last 72 hours Hepatic Function Panel No results found for this basename: PROT, ALBUMIN, AST, ALT, ALKPHOS, BILITOT, BILIDIR, IBILI,  in the last 72 hours No results found for this basename: CHOL,  in the last 72 hours No results found for this basename: INR,  in the last 72 hours  Imaging: Imaging results have been reviewed  Cardiac Studies:  Assessment/Plan:   Principal Problem:   Non-healing ulcer of foot  Active Problems:   PVD, LSFA PTA 11/29/12   Diabetes, Type 2 NIDDM, Hg A1c was 11 in Jan 2014   HTN (hypertension), poor control   Anemia, microcytic. This appears to be chronic but no prior work up. This was noted in Jan 20014. Pt refused rectal exam today. No history of melana or PUD.   History of smoking. quit 2006   Sinus tachycardia, ?  Plan- OP dopplers ordered. Check TSH with what appears to be chronic  tachycardia. Add statin with DM and PVD, (LDL 114 in Jan). Check Iron studies, pt says he will follow up with his primary MD.  The pt was started on Vibra Tabs on admission for an unknown reason, will stop as there is no signs of any infection, Add Beta blocker, increase ACE, follow up as an OP. He will need a stress test at some point.    Corine Shelter PA-C 11/30/2012, 9:56 AM

## 2012-11-30 NOTE — Progress Notes (Signed)
Pt. Seen and examined. Agree with the NP/PA-C note as written.  52 yo male referred to Dr. Allyson Sabal for critical limb salvage with non-healing heal and toe ulcers. He is noted to be tachycardic, anemic - multiple cardiac risk factors without CAD work-up. Agree with aspirin post procedure and starting low dose b-blocker - uptitrate ACE-I.  Will need outpatient stress test and work-up for anemia per his PCP.  Stable for discharge home today, but will need early close follow-up in 5-7 days in our office with MLP and ultimately with Dr. Allyson Sabal.  Chrystie Nose, MD, [facility] Attending Cardiologist [facility]

## 2012-12-07 ENCOUNTER — Inpatient Hospital Stay (HOSPITAL_COMMUNITY): Payer: BC Managed Care – PPO

## 2012-12-07 ENCOUNTER — Emergency Department (HOSPITAL_COMMUNITY): Payer: BC Managed Care – PPO

## 2012-12-07 ENCOUNTER — Encounter (HOSPITAL_BASED_OUTPATIENT_CLINIC_OR_DEPARTMENT_OTHER): Payer: BC Managed Care – PPO | Attending: General Surgery

## 2012-12-07 ENCOUNTER — Inpatient Hospital Stay (HOSPITAL_COMMUNITY)
Admission: EM | Admit: 2012-12-07 | Discharge: 2012-12-18 | DRG: 415 | Disposition: A | Discharge status: Home Health | Payer: BC Managed Care – PPO | Attending: Internal Medicine | Admitting: Internal Medicine

## 2012-12-07 ENCOUNTER — Encounter (HOSPITAL_COMMUNITY): Payer: Self-pay | Admitting: *Deleted

## 2012-12-07 DIAGNOSIS — Z79899 Other long term (current) drug therapy: Secondary | ICD-10-CM

## 2012-12-07 DIAGNOSIS — L03039 Cellulitis of unspecified toe: Secondary | ICD-10-CM | POA: Diagnosis present

## 2012-12-07 DIAGNOSIS — M9080 Osteopathy in diseases classified elsewhere, unspecified site: Secondary | ICD-10-CM | POA: Diagnosis present

## 2012-12-07 DIAGNOSIS — T45515A Adverse effect of anticoagulants, initial encounter: Secondary | ICD-10-CM | POA: Diagnosis not present

## 2012-12-07 DIAGNOSIS — E860 Dehydration: Secondary | ICD-10-CM

## 2012-12-07 DIAGNOSIS — L97521 Non-pressure chronic ulcer of other part of left foot limited to breakdown of skin: Secondary | ICD-10-CM | POA: Diagnosis present

## 2012-12-07 DIAGNOSIS — M869 Osteomyelitis, unspecified: Secondary | ICD-10-CM | POA: Diagnosis present

## 2012-12-07 DIAGNOSIS — S98919A Complete traumatic amputation of unspecified foot, level unspecified, initial encounter: Secondary | ICD-10-CM | POA: Insufficient documentation

## 2012-12-07 DIAGNOSIS — K5900 Constipation, unspecified: Secondary | ICD-10-CM | POA: Diagnosis not present

## 2012-12-07 DIAGNOSIS — R791 Abnormal coagulation profile: Secondary | ICD-10-CM | POA: Diagnosis not present

## 2012-12-07 DIAGNOSIS — Z7982 Long term (current) use of aspirin: Secondary | ICD-10-CM

## 2012-12-07 DIAGNOSIS — I70269 Atherosclerosis of native arteries of extremities with gangrene, unspecified extremity: Secondary | ICD-10-CM | POA: Diagnosis present

## 2012-12-07 DIAGNOSIS — L97509 Non-pressure chronic ulcer of other part of unspecified foot with unspecified severity: Secondary | ICD-10-CM | POA: Insufficient documentation

## 2012-12-07 DIAGNOSIS — E1149 Type 2 diabetes mellitus with other diabetic neurological complication: Secondary | ICD-10-CM | POA: Diagnosis present

## 2012-12-07 DIAGNOSIS — Z9049 Acquired absence of other specified parts of digestive tract: Secondary | ICD-10-CM

## 2012-12-07 DIAGNOSIS — R5381 Other malaise: Secondary | ICD-10-CM | POA: Diagnosis present

## 2012-12-07 DIAGNOSIS — L0390 Cellulitis, unspecified: Secondary | ICD-10-CM

## 2012-12-07 DIAGNOSIS — E11621 Type 2 diabetes mellitus with foot ulcer: Secondary | ICD-10-CM

## 2012-12-07 DIAGNOSIS — R1110 Vomiting, unspecified: Secondary | ICD-10-CM

## 2012-12-07 DIAGNOSIS — A419 Sepsis, unspecified organism: Secondary | ICD-10-CM

## 2012-12-07 DIAGNOSIS — I2510 Atherosclerotic heart disease of native coronary artery without angina pectoris: Secondary | ICD-10-CM | POA: Diagnosis present

## 2012-12-07 DIAGNOSIS — D509 Iron deficiency anemia, unspecified: Secondary | ICD-10-CM | POA: Diagnosis present

## 2012-12-07 DIAGNOSIS — Z87891 Personal history of nicotine dependence: Secondary | ICD-10-CM

## 2012-12-07 DIAGNOSIS — A4189 Other specified sepsis: Principal | ICD-10-CM | POA: Diagnosis present

## 2012-12-07 DIAGNOSIS — L02619 Cutaneous abscess of unspecified foot: Secondary | ICD-10-CM | POA: Diagnosis present

## 2012-12-07 DIAGNOSIS — L97409 Non-pressure chronic ulcer of unspecified heel and midfoot with unspecified severity: Secondary | ICD-10-CM | POA: Insufficient documentation

## 2012-12-07 DIAGNOSIS — L97929 Non-pressure chronic ulcer of unspecified part of left lower leg with unspecified severity: Secondary | ICD-10-CM | POA: Diagnosis present

## 2012-12-07 DIAGNOSIS — I739 Peripheral vascular disease, unspecified: Secondary | ICD-10-CM | POA: Diagnosis present

## 2012-12-07 DIAGNOSIS — E876 Hypokalemia: Secondary | ICD-10-CM | POA: Diagnosis present

## 2012-12-07 DIAGNOSIS — E1159 Type 2 diabetes mellitus with other circulatory complications: Secondary | ICD-10-CM | POA: Diagnosis present

## 2012-12-07 DIAGNOSIS — IMO0002 Reserved for concepts with insufficient information to code with codable children: Secondary | ICD-10-CM | POA: Diagnosis present

## 2012-12-07 DIAGNOSIS — L97522 Non-pressure chronic ulcer of other part of left foot with fat layer exposed: Secondary | ICD-10-CM

## 2012-12-07 DIAGNOSIS — F17200 Nicotine dependence, unspecified, uncomplicated: Secondary | ICD-10-CM | POA: Diagnosis present

## 2012-12-07 DIAGNOSIS — L97519 Non-pressure chronic ulcer of other part of right foot with unspecified severity: Secondary | ICD-10-CM

## 2012-12-07 DIAGNOSIS — I10 Essential (primary) hypertension: Secondary | ICD-10-CM | POA: Diagnosis present

## 2012-12-07 DIAGNOSIS — E1169 Type 2 diabetes mellitus with other specified complication: Secondary | ICD-10-CM | POA: Insufficient documentation

## 2012-12-07 DIAGNOSIS — K3184 Gastroparesis: Secondary | ICD-10-CM | POA: Diagnosis present

## 2012-12-07 DIAGNOSIS — R000 Tachycardia, unspecified: Secondary | ICD-10-CM | POA: Diagnosis present

## 2012-12-07 HISTORY — DX: Osteomyelitis, unspecified: M86.9

## 2012-12-07 HISTORY — DX: Sepsis, unspecified organism: A41.9

## 2012-12-07 LAB — CBC WITH DIFFERENTIAL/PLATELET
Basophils Absolute: 0 10*3/uL (ref 0.0–0.1)
Eosinophils Absolute: 0 10*3/uL (ref 0.0–0.7)
HCT: 23.9 % — ABNORMAL LOW (ref 39.0–52.0)
Hemoglobin: 7.7 g/dL — ABNORMAL LOW (ref 13.0–17.0)
Lymphocytes Relative: 7 % — ABNORMAL LOW (ref 12–46)
Lymphs Abs: 1.2 10*3/uL (ref 0.7–4.0)
Monocytes Absolute: 1.4 10*3/uL — ABNORMAL HIGH (ref 0.1–1.0)
Monocytes Relative: 8 % (ref 3–12)
RBC: 3.31 MIL/uL — ABNORMAL LOW (ref 4.22–5.81)
WBC: 17.6 10*3/uL — ABNORMAL HIGH (ref 4.0–10.5)

## 2012-12-07 LAB — URINALYSIS, ROUTINE W REFLEX MICROSCOPIC
Glucose, UA: 250 mg/dL — AB
Hgb urine dipstick: NEGATIVE
Ketones, ur: >80 mg/dL — AB
Protein, ur: 100 mg/dL — AB

## 2012-12-07 LAB — URINE MICROSCOPIC-ADD ON

## 2012-12-07 LAB — COMPREHENSIVE METABOLIC PANEL
ALT: 14 U/L (ref 0–53)
Calcium: 9 mg/dL (ref 8.4–10.5)
GFR calc Af Amer: >90 mL/min (ref >90)
Glucose, Bld: 226 mg/dL — ABNORMAL HIGH (ref 70–99)
Sodium: 133 mEq/L — ABNORMAL LOW (ref 135–145)
Total Protein: 7.7 g/dL (ref 6.0–8.3)

## 2012-12-07 LAB — LIPASE, BLOOD: Lipase: 13 U/L (ref 11–59)

## 2012-12-07 LAB — GLUCOSE, CAPILLARY
Glucose-Capillary: 184 mg/dL — ABNORMAL HIGH (ref 70–99)
Glucose-Capillary: 155 mg/dL — ABNORMAL HIGH (ref 70–99)

## 2012-12-07 MED ORDER — HYDROCODONE-ACETAMINOPHEN 5-325 MG PO TABS
1.0000 | ORAL_TABLET | ORAL | Status: DC | PRN
  Administered 2012-12-07 – 2012-12-13 (×22): 2 via ORAL
  Filled 2012-12-07 (×5): qty 2
  Filled 2012-12-07: qty 1
  Filled 2012-12-07 (×17): qty 2

## 2012-12-07 MED ORDER — LISINOPRIL 20 MG PO TABS
20.0000 mg | ORAL_TABLET | Freq: Every day | ORAL | Status: DC
  Administered 2012-12-07 – 2012-12-18 (×12): 20 mg via ORAL
  Filled 2012-12-07 (×13): qty 1

## 2012-12-07 MED ORDER — GADOBENATE DIMEGLUMINE 529 MG/ML IV SOLN
15.0000 mL | Freq: Once | INTRAVENOUS | Status: AC | PRN
  Administered 2012-12-07: 15 mL via INTRAVENOUS

## 2012-12-07 MED ORDER — PIPERACILLIN-TAZOBACTAM 3.375 G IVPB
3.3750 g | Freq: Three times a day (TID) | INTRAVENOUS | Status: DC
  Administered 2012-12-07 – 2012-12-12 (×14): 3.375 g via INTRAVENOUS
  Filled 2012-12-07 (×17): qty 50

## 2012-12-07 MED ORDER — ACETAMINOPHEN 650 MG RE SUPP: 650.0000 mg | Freq: Four times a day (QID) | RECTAL | Status: DC | PRN

## 2012-12-07 MED ORDER — PANTOPRAZOLE SODIUM 40 MG IV SOLR
40.0000 mg | Freq: Two times a day (BID) | INTRAVENOUS | Status: DC
  Administered 2012-12-07 – 2012-12-09 (×4): 40 mg via INTRAVENOUS
  Filled 2012-12-07 (×5): qty 40

## 2012-12-07 MED ORDER — DOCUSATE SODIUM 100 MG PO CAPS
100.0000 mg | ORAL_CAPSULE | Freq: Two times a day (BID) | ORAL | Status: DC
  Administered 2012-12-07 – 2012-12-18 (×14): 100 mg via ORAL
  Filled 2012-12-07 (×18): qty 1

## 2012-12-07 MED ORDER — SODIUM CHLORIDE 0.9 % IV BOLUS (SEPSIS)
1250.0000 mL | Freq: Once | INTRAVENOUS | Status: AC
  Administered 2012-12-07: 1250 mL via INTRAVENOUS

## 2012-12-07 MED ORDER — ADULT MULTIVITAMIN W/MINERALS CH
1.0000 | ORAL_TABLET | Freq: Every day | ORAL | Status: DC
  Administered 2012-12-07 – 2012-12-18 (×10): 1 via ORAL
  Filled 2012-12-07 (×12): qty 1

## 2012-12-07 MED ORDER — VANCOMYCIN HCL 10 G IV SOLR
1250.0000 mg | Freq: Two times a day (BID) | INTRAVENOUS | Status: DC
  Administered 2012-12-08 – 2012-12-09 (×4): 1250 mg via INTRAVENOUS
  Filled 2012-12-07 (×6): qty 1250

## 2012-12-07 MED ORDER — SODIUM CHLORIDE 0.9 % IV SOLN
INTRAVENOUS | Status: DC
  Administered 2012-12-07 – 2012-12-12 (×4): via INTRAVENOUS
  Administered 2012-12-13: 1000 mL via INTRAVENOUS
  Administered 2012-12-13 – 2012-12-17 (×3): via INTRAVENOUS

## 2012-12-07 MED ORDER — PIPERACILLIN-TAZOBACTAM 3.375 G IVPB: 3.3750 g | Freq: Three times a day (TID) | INTRAVENOUS | Status: DC

## 2012-12-07 MED ORDER — ENOXAPARIN SODIUM 40 MG/0.4ML ~~LOC~~ SOLN
40.0000 mg | SUBCUTANEOUS | Status: DC
  Administered 2012-12-07 – 2012-12-17 (×10): 40 mg via SUBCUTANEOUS
  Filled 2012-12-07 (×12): qty 0.4

## 2012-12-07 MED ORDER — SODIUM CHLORIDE 0.9 % IV SOLN: Freq: Once | INTRAVENOUS | Status: DC

## 2012-12-07 MED ORDER — SIMVASTATIN 20 MG PO TABS
20.0000 mg | ORAL_TABLET | Freq: Every day | ORAL | Status: DC
  Administered 2012-12-07 – 2012-12-17 (×7): 20 mg via ORAL
  Filled 2012-12-07 (×12): qty 1

## 2012-12-07 MED ORDER — INSULIN ASPART 100 UNIT/ML ~~LOC~~ SOLN
0.0000 [IU] | Freq: Three times a day (TID) | SUBCUTANEOUS | Status: DC
  Administered 2012-12-07 – 2012-12-08 (×4): 3 [IU] via SUBCUTANEOUS
  Administered 2012-12-09: 2 [IU] via SUBCUTANEOUS
  Administered 2012-12-09: 3 [IU] via SUBCUTANEOUS
  Administered 2012-12-09: 8 [IU] via SUBCUTANEOUS
  Administered 2012-12-10 (×2): 3 [IU] via SUBCUTANEOUS
  Administered 2012-12-10: 2 [IU] via SUBCUTANEOUS
  Administered 2012-12-11: 3 [IU] via SUBCUTANEOUS
  Administered 2012-12-11 – 2012-12-12 (×3): 2 [IU] via SUBCUTANEOUS
  Administered 2012-12-12 – 2012-12-13 (×2): 3 [IU] via SUBCUTANEOUS
  Administered 2012-12-13: 2 [IU] via SUBCUTANEOUS
  Administered 2012-12-14 – 2012-12-15 (×5): 3 [IU] via SUBCUTANEOUS
  Administered 2012-12-16: 2 [IU] via SUBCUTANEOUS
  Administered 2012-12-17: 3 [IU] via SUBCUTANEOUS
  Administered 2012-12-17 (×2): 2 [IU] via SUBCUTANEOUS
  Administered 2012-12-18: 5 [IU] via SUBCUTANEOUS

## 2012-12-07 MED ORDER — COLLAGENASE 250 UNIT/GM EX OINT
1.0000 "application " | TOPICAL_OINTMENT | Freq: Two times a day (BID) | CUTANEOUS | Status: DC
  Administered 2012-12-07 – 2012-12-08 (×2): 1 via TOPICAL
  Filled 2012-12-07: qty 30

## 2012-12-07 MED ORDER — SODIUM CHLORIDE 0.9 % IV SOLN
1250.0000 mg | Freq: Once | INTRAVENOUS | Status: AC
  Administered 2012-12-07: 1250 mg via INTRAVENOUS
  Filled 2012-12-07: qty 1250

## 2012-12-07 MED ORDER — PIPERACILLIN-TAZOBACTAM 3.375 G IVPB 30 MIN
3.3750 g | INTRAVENOUS | Status: AC
  Administered 2012-12-07: 3.375 g via INTRAVENOUS
  Filled 2012-12-07: qty 50

## 2012-12-07 MED ORDER — METOPROLOL TARTRATE 25 MG PO TABS
25.0000 mg | ORAL_TABLET | Freq: Two times a day (BID) | ORAL | Status: DC
  Administered 2012-12-07 – 2012-12-13 (×11): 25 mg via ORAL
  Filled 2012-12-07 (×16): qty 1

## 2012-12-07 MED ORDER — ONDANSETRON HCL 4 MG/2ML IJ SOLN
4.0000 mg | INTRAMUSCULAR | Status: AC
  Administered 2012-12-07: 4 mg via INTRAVENOUS
  Filled 2012-12-07: qty 2

## 2012-12-07 MED ORDER — ONDANSETRON HCL 4 MG/2ML IJ SOLN
4.0000 mg | Freq: Four times a day (QID) | INTRAMUSCULAR | Status: DC | PRN
  Administered 2012-12-10 – 2012-12-12 (×3): 4 mg via INTRAVENOUS
  Filled 2012-12-07 (×3): qty 2

## 2012-12-07 MED ORDER — ASPIRIN EC 81 MG PO TBEC
162.0000 mg | DELAYED_RELEASE_TABLET | Freq: Every day | ORAL | Status: DC
  Administered 2012-12-07 – 2012-12-18 (×11): 162 mg via ORAL
  Filled 2012-12-07 (×12): qty 2

## 2012-12-07 MED ORDER — ACETAMINOPHEN 325 MG PO TABS
650.0000 mg | ORAL_TABLET | Freq: Four times a day (QID) | ORAL | Status: DC | PRN
  Filled 2012-12-07: qty 2

## 2012-12-07 MED ORDER — SODIUM CHLORIDE 0.9 % IJ SOLN
3.0000 mL | Freq: Two times a day (BID) | INTRAMUSCULAR | Status: DC
  Administered 2012-12-07 – 2012-12-12 (×4): 3 mL via INTRAVENOUS
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
## 2012-12-07 NOTE — ED Notes (Signed)
Patient transported to MRI 
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
## 2012-12-07 NOTE — ED Notes (Signed)
Pt sent over from wound center by Dr. Jimmey Ralph. Had a popliteal stent placed last week. On Monday began having fever, increasing pain, abd pain, nausea. Pt sent for eval for debridement, IV abx and possible amputation.
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
## 2012-12-08 LAB — CBC WITH DIFFERENTIAL/PLATELET
Basophils Absolute: 0 10*3/uL (ref 0.0–0.1)
Basophils Relative: 0 % (ref 0–1)
Eosinophils Absolute: 0 10*3/uL (ref 0.0–0.7)
HCT: 20.8 % — ABNORMAL LOW (ref 39.0–52.0)
Hemoglobin: 6.7 g/dL — CL (ref 13.0–17.0)
Lymphocytes Relative: 8 % — ABNORMAL LOW (ref 12–46)
MCHC: 32.2 g/dL (ref 30.0–36.0)
Monocytes Relative: 10 % (ref 3–12)
Neutrophils Relative %: 82 % — ABNORMAL HIGH (ref 43–77)
WBC: 16.2 10*3/uL — ABNORMAL HIGH (ref 4.0–10.5)

## 2012-12-08 LAB — URINE CULTURE
Colony Count: NO GROWTH
Culture: NO GROWTH

## 2012-12-08 LAB — BASIC METABOLIC PANEL
CO2: 25 mEq/L (ref 19–32)
Calcium: 7.8 mg/dL — ABNORMAL LOW (ref 8.4–10.5)
GFR calc non Af Amer: >90 mL/min (ref >90)
Potassium: 3.6 mEq/L (ref 3.5–5.1)
Sodium: 131 mEq/L — ABNORMAL LOW (ref 135–145)

## 2012-12-08 LAB — RETICULOCYTES
RBC.: 3.1 MIL/uL — ABNORMAL LOW (ref 4.22–5.81)
Retic Count, Absolute: 37.2 10*3/uL (ref 19.0–186.0)
Retic Ct Pct: 1.2 % (ref 0.4–3.1)

## 2012-12-08 LAB — GLUCOSE, CAPILLARY
Glucose-Capillary: 153 mg/dL — ABNORMAL HIGH (ref 70–99)
Glucose-Capillary: 178 mg/dL — ABNORMAL HIGH (ref 70–99)

## 2012-12-08 LAB — VITAMIN B12: Vitamin B-12: 1846 pg/mL — ABNORMAL HIGH (ref 211–911)

## 2012-12-08 LAB — FOLATE: Folate: 14.7 ng/mL

## 2012-12-08 LAB — IRON AND TIBC: Saturation Ratios: 7 % — ABNORMAL LOW (ref 20–55)

## 2012-12-08 LAB — PREPARE RBC (CROSSMATCH)

## 2012-12-08 LAB — MRSA PCR SCREENING: MRSA by PCR: NEGATIVE

## 2012-12-08 MED ORDER — JUVEN PO PACK
1.0000 | PACK | Freq: Two times a day (BID) | ORAL | Status: DC
  Administered 2012-12-09 – 2012-12-12 (×5): 1 via ORAL
  Filled 2012-12-08 (×16): qty 1

## 2012-12-08 MED ORDER — FUROSEMIDE 10 MG/ML IJ SOLN
40.0000 mg | Freq: Once | INTRAMUSCULAR | Status: AC
  Administered 2012-12-08: 40 mg via INTRAVENOUS
  Filled 2012-12-08: qty 4

## 2012-12-08 MED ORDER — GLUCERNA SHAKE PO LIQD
237.0000 mL | Freq: Two times a day (BID) | ORAL | Status: DC
  Administered 2012-12-09 – 2012-12-12 (×5): 237 mL via ORAL
  Filled 2012-12-08 (×16): qty 237

## 2012-12-08 MED ORDER — COLLAGENASE 250 UNIT/GM EX OINT
1.0000 "application " | TOPICAL_OINTMENT | Freq: Every morning | CUTANEOUS | Status: DC
  Administered 2012-12-08 – 2012-12-16 (×8): 1 via TOPICAL
  Filled 2012-12-08: qty 30

## 2012-12-08 NOTE — Progress Notes (Signed)
PT Cancellation Note  Patient Details Name: Keith Guzman MRN: [id number] DOB: [birth year]   Cancelled Treatment:    Reason Eval/Treat Not Completed: Medical issues which prohibited therapy;Other (comment) incr. HR, vascular evaluation in progress. Check back 3/14   Rada Hay 12/08/2012, 12:29 PM

## 2012-12-08 NOTE — Progress Notes (Addendum)
[facility] PROGRESS NOTE  Keith Guzman UXL:[id number] DOB: 1961-05-26 DOA: 12/07/2012 PCP: Laurell Josephs, MD  Assessment/Plan: Sepsis : resolved - Source is likely necrotic ulcer at base of great toe, +/_ Osteomyelitis - continue IV vancomycin and Zosyn in the ED.  - FU Blood cultures  - MRI without foot abscess but some lateral calcaneal tuberosity osteomyelitis  L great toe: ischemia with necrotic ulcer: PVD, LSFA PTA 11/29/12  - continue IV Abx as above, CArds Dr.Berry to see today - Ortho following  Nausea and vomiting  Possibly related to underlying sepsis. Resolved, tolerating regular diet   Uncontrolled Type 2 diabetes mellitus  Hemoglobin A1c close to 12,  2 months back..  Holding oral hypoglycemics. Continue sliding scale insulin.  diabetic coordinator consult.   Iron deficiency anemia  Recent iron panel suggest time deficiency.. No workup done in the past. Baseline hemoglobin around 9. Now with 6.7 with hemodilution today, transfuse 2 units PRBC FU anemia panel,  Needs GI workup as outpatient   Hypertension  Currently stable. Continue metoprolol and lisinopril   TX to floor  Code Status: Full  Family Communication: none at bedside Disposition Plan: home when improved   Consultants:  Dr.Thompson, Ortho  Dr.Berry, [facility]    Antibiotics:  Vanc 3/12  Zosyn 3/12  HPI/Subjective: Feels better, no n/v  Objective: Filed Vitals:   12/07/12 2000 12/07/12 2336 12/08/12 0000 12/08/12 0400  BP: 141/74 133/72 150/85 154/73  Pulse: 108 112 112 121  Temp: 100.1 F (37.8 C)  99.7 F (37.6 C) 101.6 F (38.7 C)  TempSrc: Oral  Oral Oral  Resp: 23  12 16   Height:      Weight:    68.1 kg (150 lb 2.1 oz)  SpO2: 98%  95% 92%    Intake/Output Summary (Last 24 hours) at 12/08/12 0759 Last data filed at 12/08/12 0510  Gross per 24 hour  Intake    803 ml  Output    150 ml  Net    653 ml   Filed Weights   12/07/12 1538 12/07/12 1713 12/08/12 0400   Weight: 72.576 kg (160 lb) 67.5 kg (148 lb 13 oz) 68.1 kg (150 lb 2.1 oz)    Exam: GEn: AAOx3, no distress HEENT: PERRLA, Mouth: no erythema or exudates, dry oral mucosa.  Cardiovascular: S1/S2/RRR, no m/r/g Pulmonary/Chest: CTAB, no wheezes, rales, or rhonchi  Abdominal: Soft. NT, non-distended, bowel sounds are normal, no masses, organomegaly, or guarding present.  GU: no CVA tenderness Musculoskeletal: No joint deformities, erythema, or stiffness, ROM full and no nontender Ext:  4x 3 cm ulcer over left heel, with scaly skin at margins .  Great toe is dark and cold,  With full thickness ulcer at base, foul-smelling discharge noted.  Data Reviewed: Basic Metabolic Panel:  Recent Labs Lab 12/07/12 1025  NA 133*  K 4.0  CL 92*  CO2 22  GLUCOSE 226*  BUN 13  CREATININE 0.65  CALCIUM 9.0   Liver Function Tests:  Recent Labs Lab 12/07/12 1025  AST 16  ALT 14  ALKPHOS 83  BILITOT 0.7  PROT 7.7  ALBUMIN 2.6*    Recent Labs Lab 12/07/12 1025  LIPASE 13   No results found for this basename: AMMONIA,  in the last 168 hours CBC:  Recent Labs Lab 12/07/12 1050  WBC 17.6*  NEUTROABS 15.0*  HGB 7.7*  HCT 23.9*  MCV 72.2*  PLT 456*   Cardiac Enzymes: No results found for this basename: CKTOTAL, CKMB,  CKMBINDEX, TROPONINI,  in the last 168 hours BNP (last 3 results) No results found for this basename: PROBNP,  in the last 8760 hours CBG:  Recent Labs Lab 12/07/12 1725 12/07/12 1924 12/08/12 0728  GLUCAP 184* 155* 153*    Recent Results (from the past 240 hour(s))  MRSA PCR SCREENING     Status: None   Collection Time    12/08/12  4:33 AM      Result Value Range Status   MRSA by PCR NEGATIVE  NEGATIVE Final   Comment:            The GeneXpert MRSA Assay (FDA     approved for NASAL specimens     only), is one component of a     comprehensive MRSA colonization     surveillance program. It is not     intended to diagnose MRSA     infection nor to  guide or     monitor treatment for     MRSA infections.     Studies: Dg Chest 2 View  12/07/2012  *RADIOLOGY REPORT*  Clinical Data: Vomiting, nausea and abdominal pain.  CHEST - 2 VIEW  Comparison: 11/16/2012.  Findings: Trachea is midline.  Heart size is accentuated by somewhat low lung volumes and semi upright technique.  Probable scarring in the left suprahilar region, superimposed with osseous bridging between the left first and second anterior ribs.  Lungs are otherwise clear.  No pleural fluid.  IMPRESSION: No acute findings.   Original Report Authenticated By: Leanna Battles, M.D.    Dg Abd 1 View  12/07/2012  *RADIOLOGY REPORT*  Clinical Data: Mid abdominal pain.  Constipation.  ABDOMEN - 1 VIEW  Comparison: None.  Findings: No free intraperitoneal air is identified.  Bowel gas pattern is normal.  No abnormal abdominal calcification or focal bony abnormality.  There is some right hip degenerative disease.  IMPRESSION: No acute finding.   Original Report Authenticated By: Holley Dexter, M.D.    Mr Foot Left W Wo Contrast  12/07/2012  *RADIOLOGY REPORT*  Clinical Data: Osteomyelitis.  Fever.  Sepsis.  Diabetic foot. Foot ulceration.  MRI OF THE LEFT FOREFOOT WITHOUT AND WITH CONTRAST,MRI OF THE LEFT ANKLE WITHOUT AND WITH CONTRAST  Technique:  Multiplanar, multisequence MR imaging was performed both before and after administration of intravenous contrast.  Contrast: 15mL MULTIHANCE GADOBENATE DIMEGLUMINE 529 MG/ML IV SOLN  Comparison: None.  Findings: Motion artifact is present on the examination.  Diabetic myositis is present in the plantar foot musculature.  Diffuse subcutaneous edema and enhancement compatible with cellulitis.  There is no soft tissue abscess identified.  There is a dorsal forefoot ulceration overlying the proximal phalanx of the great toe however this is not extend to the bony surface.  There is a blister along the lateral aspect of the great toe.  No soft tissue abscess  is present.  Small effusion of the first MTP joint.  Marginal erosions are present in the first MTP, suggesting gout arthritis over pressure erosion.  Correlation with serum uric acid is recommended.  IMPRESSION: Negative for osteomyelitis of the forefoot.  Dorsal forefoot ulceration overlying the proximal phalanx of the great toe and blistering along the lateral aspect of the great toe.  No soft tissue abscess.  MRI LEFT ANKLE WITH AND WITHOUT CONTRAST  Technique:  Multiplanar, multisequence MR imaging of the left ankle was performed before and after the administration of intravenous contrast.  Contrast: 15mL MULTIHANCE GADOBENATE DIMEGLUMINE 529 MG/ML IV SOLN  Comparison:  10/16/2012.  Findings: Diffuse subcutaneous edema is present which may represent dependent edema or cellulitis in the appropriate clinical setting.  Diabetic myopathy is present with some fatty atrophy of the plantar foot musculature.  There is a new ulcer along the lateral aspects of the left calcaneal tuberosity with phlegmon tracking to the cortical surface and osteomyelitis of the lateral calcaneal tuberosity (image number 20 series 17).  These marrow changes are new compared to the prior exam of 10/16/2012 and the ulcer appears new as well.  Midfoot appears within normal limits without evidence of erosions.  The flexor and extensor tendons appear within normal limits.  Talus appears normal.  Medial and lateral malleolus and the tibial plafond are within normal limits.  Small ankle and subtalar effusions are likely reactive.  IMPRESSION: New small area of osteomyelitis of the lateral calcaneal tuberosity with posterior lateral heel ulcer tracking to the focus of osteomyelitis.  No abscess.   Original Report Authenticated By: Andreas Newport, M.D.    Mr Ankle Left W Wo Contrast  12/07/2012  *RADIOLOGY REPORT*  Clinical Data: Osteomyelitis.  Fever.  Sepsis.  Diabetic foot. Foot ulceration.  MRI OF THE LEFT FOREFOOT WITHOUT AND WITH  CONTRAST,MRI OF THE LEFT ANKLE WITHOUT AND WITH CONTRAST  Technique:  Multiplanar, multisequence MR imaging was performed both before and after administration of intravenous contrast.  Contrast: 15mL MULTIHANCE GADOBENATE DIMEGLUMINE 529 MG/ML IV SOLN  Comparison: None.  Findings: Motion artifact is present on the examination.  Diabetic myositis is present in the plantar foot musculature.  Diffuse subcutaneous edema and enhancement compatible with cellulitis.  There is no soft tissue abscess identified.  There is a dorsal forefoot ulceration overlying the proximal phalanx of the great toe however this is not extend to the bony surface.  There is a blister along the lateral aspect of the great toe.  No soft tissue abscess is present.  Small effusion of the first MTP joint.  Marginal erosions are present in the first MTP, suggesting gout arthritis over pressure erosion.  Correlation with serum uric acid is recommended.  IMPRESSION: Negative for osteomyelitis of the forefoot.  Dorsal forefoot ulceration overlying the proximal phalanx of the great toe and blistering along the lateral aspect of the great toe.  No soft tissue abscess.  MRI LEFT ANKLE WITH AND WITHOUT CONTRAST  Technique:  Multiplanar, multisequence MR imaging of the left ankle was performed before and after the administration of intravenous contrast.  Contrast: 15mL MULTIHANCE GADOBENATE DIMEGLUMINE 529 MG/ML IV SOLN  Comparison:  10/16/2012.  Findings: Diffuse subcutaneous edema is present which may represent dependent edema or cellulitis in the appropriate clinical setting.  Diabetic myopathy is present with some fatty atrophy of the plantar foot musculature.  There is a new ulcer along the lateral aspects of the left calcaneal tuberosity with phlegmon tracking to the cortical surface and osteomyelitis of the lateral calcaneal tuberosity (image number 20 series 17).  These marrow changes are new compared to the prior exam of 10/16/2012 and the ulcer  appears new as well.  Midfoot appears within normal limits without evidence of erosions.  The flexor and extensor tendons appear within normal limits.  Talus appears normal.  Medial and lateral malleolus and the tibial plafond are within normal limits.  Small ankle and subtalar effusions are likely reactive.  IMPRESSION: New small area of osteomyelitis of the lateral calcaneal tuberosity with posterior lateral heel ulcer tracking to the focus of osteomyelitis.  No abscess.   Original Report  Authenticated By: Andreas Newport, M.D.     Scheduled Meds: . sodium chloride   Intravenous Once  . aspirin EC  162 mg Oral Daily  . collagenase  1 application Topical BID  . docusate sodium  100 mg Oral BID  . enoxaparin (LOVENOX) injection  40 mg Subcutaneous Q24H  . insulin aspart  0-15 Units Subcutaneous TID WC  . lisinopril  20 mg Oral Daily  . metoprolol tartrate  25 mg Oral BID  . multivitamin with minerals  1 tablet Oral Daily  . pantoprazole (PROTONIX) IV  40 mg Intravenous Q12H  . piperacillin-tazobactam (ZOSYN)  IV  3.375 g Intravenous Q8H  . simvastatin  20 mg Oral q1800  . sodium chloride  3 mL Intravenous Q12H  . vancomycin  1,250 mg Intravenous Q12H   Continuous Infusions: . sodium chloride 125 mL/hr at 12/07/12 2059    Principal Problem:   Sepsis Active Problems:   DIABETES MELLITUS II, UNCOMPLICATED   ANEMIA, IRON DEFICIENCY, UNSPEC.   HYPERTENSION, BENIGN SYSTEMIC   PVD, LSFA PTA 11/29/12   Non-healing ulcer of foot   History of smoking. quit 2005   Sinus tachycardia   Skin ulcer of left lower leg   Osteomyelitis of ankle, left foot and ankle    Time spent:2min    [facility]  [facility] Pager [phone]. If 7PM-7AM, please contact night-coverage at [email or web address], password [facility] 12/08/2012, 7:59 AM  LOS: 1 day

## 2012-12-08 NOTE — Progress Notes (Signed)
[id number]/VHQION [name], RN, BSN, CCM:  CHART REVIEWED AND UPDATED.  Next chart review due on [id number]. NO DISCHARGE NEEDS PRESENT AT THIS TIME. CASE MANAGEMENT [phone]

## 2012-12-08 NOTE — Consult Note (Signed)
WOC consult Note Reason for Consult:L heel and circumferential wound at base of Left great toe.  Patient is followed at the outpatient wound care center Wound type:arterial insufficiency (likely), vs. neuropathy vs venous insufficiency Pressure Ulcer POA: No Measurement:left great toe:  7cm x 1cm x undetermined depth due to the presence of slough in the wound bed.  Left heel measures 2.5cm x 2c, x .2cm Wound JXB:JYNW of toe wound as described above.  Left heel is clean, pink. Drainage (amount, consistency, odor) yellow exudate from wound at vbase of toe, scant serous drainage from heel Periwound: Dressing procedure/placement/frequency:I will continue collagenase/Santyl to heel and base of toe wounds for the time being.  ABI's are being conducted this am and vascular team (VVS) may be needed to assist in management.  A Profore boot is provided to keep foot elevated/heel floated. I will not follow, but will remain available as needed.  Please re-consult if needed. Thanks, Ladona Mow, MSN, RN, [facility], CWOCN [phone])

## 2012-12-08 NOTE — Progress Notes (Signed)
VASCULAR LAB PRELIMINARY  ARTERIAL  ABI completed:    RIGHT    LEFT    PRESSURE WAVEFORM  PRESSURE WAVEFORM  BRACHIAL 157 Triphasic BRACHIAL 166 Triphasic  DP 144 Biphasic DP 125  Monophasic to Biphasic         PT 143 Biphasic PT 121 Bi[hasic                  RIGHT LEFT  ABI 0.87 0.75   ABIs indicate a mild reduction in arterial flow bilaterally with the right being greater than the left bilaterally at rest.  SLAUGHTER, [place], RVS 12/08/2012, 9:02 AM

## 2012-12-08 NOTE — Progress Notes (Signed)
Inpatient Diabetes Program Recommendations  AACE/ADA: New Consensus Statement on Inpatient Glycemic Control (2013)  Target Ranges:  Prepandial:   less than 140 mg/dL      Peak postprandial:   less than 180 mg/dL (1-2 hours)      Critically ill patients:  140 - 180 mg/dL   Reason for Visit: Elevated HgbA1C 2 months ago  Pt states he stopped taking his metformin and glipizide in past couple of mo when he lost his insurance.  Checks his blood sugars once/day at home.  Restarted OHAs 2 mo ago. Good control here in hospital.   Inpatient Diabetes Program Recommendations HgbA1C: Please order HgbA1C to assess glycemic control for past 2 -3 months (Last one 11.9% on 10/15/2012)  Note: May benefit from OP Diabetes Education if HgbA1C continues to be elevated.  Will follow. Thank you. Ailene Ards, RD, LDN, CDE Inpatient Diabetes Coordinator [phone]
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
## 2012-12-09 ENCOUNTER — Encounter (HOSPITAL_BASED_OUTPATIENT_CLINIC_OR_DEPARTMENT_OTHER): Payer: BC Managed Care – PPO

## 2012-12-09 DIAGNOSIS — L97509 Non-pressure chronic ulcer of other part of unspecified foot with unspecified severity: Secondary | ICD-10-CM

## 2012-12-09 DIAGNOSIS — L98499 Non-pressure chronic ulcer of skin of other sites with unspecified severity: Secondary | ICD-10-CM

## 2012-12-09 DIAGNOSIS — I739 Peripheral vascular disease, unspecified: Secondary | ICD-10-CM

## 2012-12-09 DIAGNOSIS — E1169 Type 2 diabetes mellitus with other specified complication: Secondary | ICD-10-CM

## 2012-12-09 LAB — TYPE AND SCREEN
ABO/RH(D): O POS
Antibody Screen: NEGATIVE
Unit division: 0

## 2012-12-09 LAB — CBC
HCT: 26.1 % — ABNORMAL LOW (ref 39.0–52.0)
Hemoglobin: 8.5 g/dL — ABNORMAL LOW (ref 13.0–17.0)
MCH: 24.1 pg — ABNORMAL LOW (ref 26.0–34.0)
MCHC: 32.6 g/dL (ref 30.0–36.0)
MCV: 73.9 fL — ABNORMAL LOW (ref 78.0–100.0)
RBC: 3.53 MIL/uL — ABNORMAL LOW (ref 4.22–5.81)

## 2012-12-09 LAB — BASIC METABOLIC PANEL
BUN: 8 mg/dL (ref 6–23)
CO2: 28 mEq/L (ref 19–32)
Calcium: 7.6 mg/dL — ABNORMAL LOW (ref 8.4–10.5)
GFR calc non Af Amer: >90 mL/min (ref >90)
Glucose, Bld: 192 mg/dL — ABNORMAL HIGH (ref 70–99)
Sodium: 133 mEq/L — ABNORMAL LOW (ref 135–145)

## 2012-12-09 LAB — LIPASE, BLOOD: Lipase: 13 U/L (ref 11–59)

## 2012-12-09 LAB — AMYLASE: Amylase: 30 U/L (ref 0–105)

## 2012-12-09 LAB — VANCOMYCIN, TROUGH: Vancomycin Tr: 10.4 ug/mL (ref 10.0–20.0)

## 2012-12-09 LAB — GLUCOSE, CAPILLARY: Glucose-Capillary: 255 mg/dL — ABNORMAL HIGH (ref 70–99)

## 2012-12-09 MED ORDER — PANTOPRAZOLE SODIUM 40 MG PO TBEC
40.0000 mg | DELAYED_RELEASE_TABLET | Freq: Two times a day (BID) | ORAL | Status: DC
  Administered 2012-12-10 (×2): 40 mg via ORAL
  Filled 2012-12-09 (×3): qty 1

## 2012-12-09 MED ORDER — POLYETHYLENE GLYCOL 3350 17 G PO PACK
17.0000 g | PACK | Freq: Every day | ORAL | Status: DC | PRN
  Administered 2012-12-09: 17 g via ORAL

## 2012-12-09 MED ORDER — VANCOMYCIN HCL 1000 MG IV SOLR
750.0000 mg | Freq: Three times a day (TID) | INTRAVENOUS | Status: DC
  Administered 2012-12-09 – 2012-12-11 (×6): 750 mg via INTRAVENOUS
  Filled 2012-12-09 (×8): qty 750

## 2012-12-09 MED ORDER — GLIPIZIDE 10 MG PO TABS
10.0000 mg | ORAL_TABLET | Freq: Two times a day (BID) | ORAL | Status: DC
  Administered 2012-12-09 – 2012-12-14 (×9): 10 mg via ORAL
  Filled 2012-12-09 (×12): qty 1

## 2012-12-09 NOTE — Progress Notes (Signed)
Subjective:  No chest pain. Still febrile  Objective:  Vital Signs in the last 24 hours: Temp:  [100 F (37.8 C)-102.6 F (39.2 C)] 102.3 F (39.1 C) (03/14 0531) Pulse Rate:  [108-128] 112 (03/14 1153) Resp:  [11-18] 14 (03/14 0531) BP: (108-157)/(64-88) 157/88 mmHg (03/14 1153) SpO2:  [85 %-97 %] 94 % (03/14 0531)  Intake/Output from previous day:  Intake/Output Summary (Last 24 hours) at 12/09/12 1215 Last data filed at 12/09/12 0736  Gross per 24 hour  Intake 3322.08 ml  Output    825 ml  Net 2497.08 ml    Physical Exam: General appearance: alert, cooperative, cachectic and no distress Lungs: clear to auscultation bilaterally Heart: regular rate and rhythm   Rate: 110  Rhythm: sinus tachycardia  Lab Results:  Recent Labs  12/08/12 0750 12/09/12 0418  WBC 16.2* 21.7*  HGB 6.7* 8.5*  PLT 415* 445*    Recent Labs  12/08/12 0750 12/09/12 0418  NA 131* 133*  K 3.6 3.3*  CL 94* 96  CO2 25 28  GLUCOSE 161* 192*  BUN 8 8  CREATININE 0.75 0.87   No results found for this basename: TROPONINI, CK, MB,  in the last 72 hours Hepatic Function Panel  Recent Labs  12/07/12 1025  PROT 7.7  ALBUMIN 2.6*  AST 16  ALT 14  ALKPHOS 83  BILITOT 0.7   No results found for this basename: CHOL,  in the last 72 hours No results found for this basename: INR,  in the last 72 hours  Imaging: Imaging results have been reviewed  Cardiac Studies:  Assessment/Plan:   Principal Problem:   Sepsis Active Problems:   PVD, LSFA PTA 11/29/12   Type II or unspecified type diabetes mellitus with unspecified complication, uncontrolled   ANEMIA, microcytic- Hgb down to 6.7 12/07/12- transfused   Non-healing ulcer of foot   HTN (hypertension), poor control   Skin ulcer of left lower leg   Osteomyelitis of ankle, left foot and ankle- MRI 12/07/12   History of smoking. quit 2005   Sinus tachycardia  Plan- He remains febrile. WBC up to 21K.  His Hgb is up to 8.5 from 6.7  after transfusion. We have no cardiac test on this pt- he has never had an echo or Myoview. Consider an echo for LVF and Myoview at a later date. MD to see- we will be available this weekend for any PV or CV issues.    Corine Shelter PA-C 12/09/2012, 12:15 PM   Agree with note written by Corine Shelter PAC  Remains on IV ATBX. WBC still elevated. ID seeing. No change in therapy. His foot doesn't hurt. The jury is still out regarding amputation. Will see again Monday unless there are problems over the weekend. Needs a picc line.   Runell Gess 12/09/2012 6:02 PM
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
## 2012-12-09 NOTE — Progress Notes (Signed)
Dr. Hazle Coca input/recommendations noted.  No current active orthopaedic surgical indications.  Will not continue to follow.   Please have vascular colleagues re-consult ortho if any orthopaedic issues arise.

## 2012-12-09 NOTE — Progress Notes (Signed)
  Pharmacy Note: IV --> PO Conversion  This patient is receiving IV Protonix. Based on criteria approved by the Pharmacy and Therapeutics Committee, this medication is being converted to the equivalent oral dose form. These criteria include:   . The patient is eating (either orally or per tube) and/or has been taking other orally administered medications for at least 24 hours.  . This patient has no evidence of active gastrointestinal bleeding or impaired GI absorption (gastrectomy, short bowel, patient on TNA or NPO).   If you have questions about this conversion, please contact the pharmacy department.  Currently on BID protonix. Does this still need to be given BID, or can it be decreased to once daily (as per home regimen)?  Darrol Angel, PharmD Pager: [phone] 12/09/2012 12:58 PM

## 2012-12-09 NOTE — Progress Notes (Signed)
[facility] PROGRESS NOTE  Keith Guzman ZOX:[id number] DOB: [birth year] DOA: 12/07/2012 PCP: Laurell Josephs, MD  Assessment/Plan: Sepsis : resolved - Source is likely necrotic ulcer at base of great toe, +/_ Osteomyelitis - continue IV vancomycin and Zosyn  - FU Blood cultures -NGTD - MRI without foot abscess but some lateral calcaneal tuberosity osteomyelitis  L great toe: ischemia with necrotic ulcer: PVD, and possible osteomyelitis in lateral calcaneous tuberosity - LSFA PTA 11/29/12 per Dr.Berry, followed by 2 vessel run off - continue IV Abx as above, d/w Dr.Berry, he recommends abx therapy at this point - Ortho Dr.Thompson signed off - will request ID consult  Nausea and vomiting  Possibly related to underlying sepsis. Resolved, tolerating regular diet   Uncontrolled Type 2 diabetes mellitus  Hemoglobin A1c close to 12,  2 months back..  Resume glipizide diabetic coordinator consult.   Iron deficiency anemia  Anemia panel suggestive of iron deficiency.. No workup done in the past. Baseline hemoglobin around 9., s/p 2 units PRBC 3/13 Needs GI workup as outpatient, start Fe at DC   Hypertension  Currently stable. Continue metoprolol and lisinopril   Possible CAD: stress test per cards as outpatient  TX to floor  Code Status: Full  Family Communication: none at bedside Disposition Plan: home when improved   Consultants:  Dr.Thompson, Ortho  Dr.Berry, [facility]    Antibiotics:  Vanc 3/12  Zosyn 3/12  HPI/Subjective: Feels better, some nausea earlier today, febrile early this am  Objective: Filed Vitals:   12/08/12 2050 12/08/12 2145 12/09/12 0531 12/09/12 1153  BP: 135/88 149/83 146/74 157/88  Pulse: 112 118 115 112  Temp: 100 F (37.8 C) 101.7 F (38.7 C) 102.3 F (39.1 C)   TempSrc:  Oral Oral   Resp: 14 16 14    Height:      Weight:      SpO2: 97%  94%     Intake/Output Summary (Last 24 hours) at 12/09/12 1228 Last data filed at 12/09/12  0736  Gross per 24 hour  Intake 3322.08 ml  Output    825 ml  Net 2497.08 ml   Filed Weights   12/07/12 1538 12/07/12 1713 12/08/12 0400  Weight: 72.576 kg (160 lb) 67.5 kg (148 lb 13 oz) 68.1 kg (150 lb 2.1 oz)    Exam: GEn: AAOx3, no distress HEENT: PERRLA, Mouth: no erythema or exudates, dry oral mucosa.  Cardiovascular: S1/S2/RRR, no m/r/g Pulmonary/Chest: CTAB, no wheezes, rales, or rhonchi  Abdominal: Soft. NT, non-distended, bowel sounds are normal, no masses, organomegaly, or guarding present.  GU: no CVA tenderness Musculoskeletal: No joint deformities, erythema, or stiffness, ROM full and no nontender Ext:  4x 3 cm ulcer over left heel, with scaly skin at margins .  Great toe is dark and cold,  With full thickness ulcer at base, foul-smelling discharge noted.  Data Reviewed: Basic Metabolic Panel:  Recent Labs Lab 12/07/12 1025 12/08/12 0750 12/09/12 0418  NA 133* 131* 133*  K 4.0 3.6 3.3*  CL 92* 94* 96  CO2 22 25 28   GLUCOSE 226* 161* 192*  BUN 13 8 8   CREATININE 0.65 0.75 0.87  CALCIUM 9.0 7.8* 7.6*   Liver Function Tests:  Recent Labs Lab 12/07/12 1025  AST 16  ALT 14  ALKPHOS 83  BILITOT 0.7  PROT 7.7  ALBUMIN 2.6*    Recent Labs Lab 12/07/12 1025  LIPASE 13   No results found for this basename: AMMONIA,  in the last 168 hours CBC:  Recent Labs Lab 12/07/12 1050 12/08/12 0750 12/09/12 0418  WBC 17.6* 16.2* 21.7*  NEUTROABS 15.0* 13.3*  --   HGB 7.7* 6.7* 8.5*  HCT 23.9* 20.8* 26.1*  MCV 72.2* 72.7* 73.9*  PLT 456* 415* 445*   Cardiac Enzymes: No results found for this basename: CKTOTAL, CKMB, CKMBINDEX, TROPONINI,  in the last 168 hours BNP (last 3 results) No results found for this basename: PROBNP,  in the last 8760 hours CBG:  Recent Labs Lab 12/08/12 0728 12/08/12 1136 12/08/12 1530 12/08/12 2157 12/09/12 0718  GLUCAP 153* 178* 124* 169* 255*    Recent Results (from the past 240 hour(s))  CULTURE, BLOOD  (ROUTINE X 2)     Status: None   Collection Time    12/07/12 10:00 AM      Result Value Range Status   Specimen Description BLOOD LEFT FOREARM   Final   Special Requests BOTTLES DRAWN AEROBIC ONLY   Final   Culture  Setup Time 12/07/2012 13:45   Final   Culture     Final   Value:        BLOOD CULTURE RECEIVED NO GROWTH TO DATE CULTURE WILL BE HELD FOR 5 DAYS BEFORE ISSUING A FINAL NEGATIVE REPORT   Report Status PENDING   Incomplete  URINE CULTURE     Status: None   Collection Time    12/07/12 10:40 AM      Result Value Range Status   Specimen Description URINE, CLEAN CATCH   Final   Special Requests NONE   Final   Culture  Setup Time 12/07/2012 13:48   Final   Colony Count NO GROWTH   Final   Culture NO GROWTH   Final   Report Status 12/08/2012 FINAL   Final  CULTURE, BLOOD (ROUTINE X 2)     Status: None   Collection Time    12/07/12 10:50 AM      Result Value Range Status   Specimen Description BLOOD EFT FOREARM   Final   Special Requests BOTTLES DRAWN AEROBIC ONLY 5CC   Final   Culture  Setup Time 12/07/2012 13:45   Final   Culture     Final   Value:        BLOOD CULTURE RECEIVED NO GROWTH TO DATE CULTURE WILL BE HELD FOR 5 DAYS BEFORE ISSUING A FINAL NEGATIVE REPORT   Report Status PENDING   Incomplete  MRSA PCR SCREENING     Status: None   Collection Time    12/08/12  4:33 AM      Result Value Range Status   MRSA by PCR NEGATIVE  NEGATIVE Final   Comment:            The GeneXpert MRSA Assay (FDA     approved for NASAL specimens     only), is one component of a     comprehensive MRSA colonization     surveillance program. It is not     intended to diagnose MRSA     infection nor to guide or     monitor treatment for     MRSA infections.     Studies: Dg Chest 2 View  12/07/2012  *RADIOLOGY REPORT*  Clinical Data: Vomiting, nausea and abdominal pain.  CHEST - 2 VIEW  Comparison: 11/16/2012.  Findings: Trachea is midline.  Heart size is accentuated by somewhat  low lung volumes and semi upright technique.  Probable scarring in the left suprahilar region, superimposed with osseous bridging between the  left first and second anterior ribs.  Lungs are otherwise clear.  No pleural fluid.  IMPRESSION: No acute findings.   Original Report Authenticated By: Leanna Battles, M.D.    Mr Foot Left W Wo Contrast  12/07/2012  *RADIOLOGY REPORT*  Clinical Data: Osteomyelitis.  Fever.  Sepsis.  Diabetic foot. Foot ulceration.  MRI OF THE LEFT FOREFOOT WITHOUT AND WITH CONTRAST,MRI OF THE LEFT ANKLE WITHOUT AND WITH CONTRAST  Technique:  Multiplanar, multisequence MR imaging was performed both before and after administration of intravenous contrast.  Contrast: 15mL MULTIHANCE GADOBENATE DIMEGLUMINE 529 MG/ML IV SOLN  Comparison: None.  Findings: Motion artifact is present on the examination.  Diabetic myositis is present in the plantar foot musculature.  Diffuse subcutaneous edema and enhancement compatible with cellulitis.  There is no soft tissue abscess identified.  There is a dorsal forefoot ulceration overlying the proximal phalanx of the great toe however this is not extend to the bony surface.  There is a blister along the lateral aspect of the great toe.  No soft tissue abscess is present.  Small effusion of the first MTP joint.  Marginal erosions are present in the first MTP, suggesting gout arthritis over pressure erosion.  Correlation with serum uric acid is recommended.  IMPRESSION: Negative for osteomyelitis of the forefoot.  Dorsal forefoot ulceration overlying the proximal phalanx of the great toe and blistering along the lateral aspect of the great toe.  No soft tissue abscess.  MRI LEFT ANKLE WITH AND WITHOUT CONTRAST  Technique:  Multiplanar, multisequence MR imaging of the left ankle was performed before and after the administration of intravenous contrast.  Contrast: 15mL MULTIHANCE GADOBENATE DIMEGLUMINE 529 MG/ML IV SOLN  Comparison:  10/16/2012.  Findings: Diffuse  subcutaneous edema is present which may represent dependent edema or cellulitis in the appropriate clinical setting.  Diabetic myopathy is present with some fatty atrophy of the plantar foot musculature.  There is a new ulcer along the lateral aspects of the left calcaneal tuberosity with phlegmon tracking to the cortical surface and osteomyelitis of the lateral calcaneal tuberosity (image number 20 series 17).  These marrow changes are new compared to the prior exam of 10/16/2012 and the ulcer appears new as well.  Midfoot appears within normal limits without evidence of erosions.  The flexor and extensor tendons appear within normal limits.  Talus appears normal.  Medial and lateral malleolus and the tibial plafond are within normal limits.  Small ankle and subtalar effusions are likely reactive.  IMPRESSION: New small area of osteomyelitis of the lateral calcaneal tuberosity with posterior lateral heel ulcer tracking to the focus of osteomyelitis.  No abscess.   Original Report Authenticated By: Andreas Newport, M.D.    Mr Ankle Left W Wo Contrast  12/07/2012  *RADIOLOGY REPORT*  Clinical Data: Osteomyelitis.  Fever.  Sepsis.  Diabetic foot. Foot ulceration.  MRI OF THE LEFT FOREFOOT WITHOUT AND WITH CONTRAST,MRI OF THE LEFT ANKLE WITHOUT AND WITH CONTRAST  Technique:  Multiplanar, multisequence MR imaging was performed both before and after administration of intravenous contrast.  Contrast: 15mL MULTIHANCE GADOBENATE DIMEGLUMINE 529 MG/ML IV SOLN  Comparison: None.  Findings: Motion artifact is present on the examination.  Diabetic myositis is present in the plantar foot musculature.  Diffuse subcutaneous edema and enhancement compatible with cellulitis.  There is no soft tissue abscess identified.  There is a dorsal forefoot ulceration overlying the proximal phalanx of the great toe however this is not extend to the bony surface.  There is  a blister along the lateral aspect of the great toe.  No soft tissue  abscess is present.  Small effusion of the first MTP joint.  Marginal erosions are present in the first MTP, suggesting gout arthritis over pressure erosion.  Correlation with serum uric acid is recommended.  IMPRESSION: Negative for osteomyelitis of the forefoot.  Dorsal forefoot ulceration overlying the proximal phalanx of the great toe and blistering along the lateral aspect of the great toe.  No soft tissue abscess.  MRI LEFT ANKLE WITH AND WITHOUT CONTRAST  Technique:  Multiplanar, multisequence MR imaging of the left ankle was performed before and after the administration of intravenous contrast.  Contrast: 15mL MULTIHANCE GADOBENATE DIMEGLUMINE 529 MG/ML IV SOLN  Comparison:  10/16/2012.  Findings: Diffuse subcutaneous edema is present which may represent dependent edema or cellulitis in the appropriate clinical setting.  Diabetic myopathy is present with some fatty atrophy of the plantar foot musculature.  There is a new ulcer along the lateral aspects of the left calcaneal tuberosity with phlegmon tracking to the cortical surface and osteomyelitis of the lateral calcaneal tuberosity (image number 20 series 17).  These marrow changes are new compared to the prior exam of 10/16/2012 and the ulcer appears new as well.  Midfoot appears within normal limits without evidence of erosions.  The flexor and extensor tendons appear within normal limits.  Talus appears normal.  Medial and lateral malleolus and the tibial plafond are within normal limits.  Small ankle and subtalar effusions are likely reactive.  IMPRESSION: New small area of osteomyelitis of the lateral calcaneal tuberosity with posterior lateral heel ulcer tracking to the focus of osteomyelitis.  No abscess.   Original Report Authenticated By: Andreas Newport, M.D.     Scheduled Meds: . aspirin EC  162 mg Oral Daily  . collagenase  1 application Topical q morning - 10a  . docusate sodium  100 mg Oral BID  . enoxaparin (LOVENOX) injection  40 mg  Subcutaneous Q24H  . feeding supplement  237 mL Oral BID BM  . insulin aspart  0-15 Units Subcutaneous TID WC  . lisinopril  20 mg Oral Daily  . metoprolol tartrate  25 mg Oral BID  . multivitamin with minerals  1 tablet Oral Daily  . nutrition supplement  1 packet Oral BID BM  . pantoprazole (PROTONIX) IV  40 mg Intravenous Q12H  . piperacillin-tazobactam (ZOSYN)  IV  3.375 g Intravenous Q8H  . simvastatin  20 mg Oral q1800  . sodium chloride  3 mL Intravenous Q12H  . vancomycin  1,250 mg Intravenous Q12H   Continuous Infusions: . sodium chloride 125 mL/hr at 12/09/12 0443    Principal Problem:   Sepsis Active Problems:   Type II or unspecified type diabetes mellitus with unspecified complication, uncontrolled   ANEMIA, microcytic- Hgb down to 6.7 12/07/12- transfused   PVD, LSFA PTA 11/29/12   Non-healing ulcer of foot   HTN (hypertension), poor control   History of smoking. quit 2005   Sinus tachycardia   Skin ulcer of left lower leg   Osteomyelitis of ankle, left foot and ankle- MRI 12/07/12    Time spent:29min    [facility]  [facility] Pager [phone]. If 7PM-7AM, please contact night-coverage at [email or web address], password [facility] 12/09/2012, 12:28 PM  LOS: 2 days
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
## 2012-12-09 NOTE — Evaluation (Signed)
Physical Therapy Evaluation Patient Details Name: Keith Guzman MRN: [id number] DOB: 18-Aug-1961 Today's Date: 12/09/2012 Time: 9811-9147 PT Time Calculation (min): 11 min  PT Assessment / Plan / Recommendation Clinical Impression  52 yo male admitted with sepsis. Has hx of DM, osteomyelitis of L foot/ankle. Pt c/o moderate-severe abdominal pain. On eval, pt required Min-Min guard assist for intermittent instability. Recommend HHPT vs no follow-up , depending on progress.     PT Assessment  Patient needs continued PT services    Follow Up Recommendations  Home health PT;No PT follow up (depending on progress)    Does the patient have the potential to tolerate intense rehabilitation      Barriers to Discharge        Equipment Recommendations   (to be further determined)    Recommendations for Other Services     Frequency Min 3X/week    Precautions / Restrictions Precautions Precautions: Fall Required Braces or Orthoses: Other Brace/Splint Other Brace/Splint: orthopedic shoe L foot for ambulation Restrictions Weight Bearing Restrictions: No   Pertinent Vitals/Pain 9/10 abdominal pain      Mobility  Bed Mobility Bed Mobility: Supine to Sit Supine to Sit: HOB elevated;With rails;6: Modified independent (Device/Increase time) Details for Bed Mobility Assistance: Increased time.  Transfers Transfers: Sit to Stand;Stand to Sit Sit to Stand: 4: Min guard;From bed Stand to Sit: 4: Min guard;To chair/3-in-1 Details for Transfer Assistance: VCs safety, technique, hand placement.  Ambulation/Gait Ambulation Distance (Feet): 60 Feet Assistive device: Rolling walker Ambulation/Gait Assistance Details: VCs safety. Used RW for stability-pt c/o lightheadedness.  Gait Pattern: Step-through pattern;Decreased stride length    Exercises     PT Diagnosis: Generalized weakness;Acute pain;Difficulty walking  PT Problem List: Pain;Decreased activity tolerance;Decreased  mobility;Decreased balance PT Treatment Interventions: DME instruction;Gait training;Functional mobility training;Therapeutic activities;Therapeutic exercise;Patient/family education   PT Goals Acute Rehab PT Goals PT Goal Formulation: With patient/family Time For Goal Achievement: 12/16/12 Potential to Achieve Goals: Good Pt will go Sit to Stand: with supervision PT Goal: Sit to Stand - Progress: Goal set today Pt will Ambulate: 51 - 150 feet;with supervision;with least restrictive assistive device PT Goal: Ambulate - Progress: Goal set today  Visit Information  Last PT Received On: 12/09/12 Assistance Needed: +1    Subjective Data  Subjective: I would be okay if my stomach didnt hurt Patient Stated Goal: home. less pain   Prior Functioning  Home Living Lives With: Spouse Available Help at Discharge: Family Type of Home: House Home Access: Level entry Home Layout: One level Bathroom Shower/Tub: Network engineer: None Prior Function Level of Independence: Independent Able to Take Stairs?: No Communication Communication: No difficulties    Cognition  Cognition Overall Cognitive Status: Appears within functional limits for tasks assessed/performed Arousal/Alertness: Awake/alert Orientation Level: Appears intact for tasks assessed Behavior During Session: [facility] for tasks performed    Extremity/Trunk Assessment Right Lower Extremity Assessment RLE ROM/Strength/Tone: Deficits RLE ROM/Strength/Tone Deficits: strength at least 3+/5 with functional activity Left Lower Extremity Assessment LLE ROM/Strength/Tone: Deficits LLE ROM/Strength/Tone Deficits: strength at least 3+/5 with functional activity   Balance    End of Session PT - End of Session Equipment Utilized During Treatment: Gait belt Activity Tolerance: Patient limited by fatigue;Patient limited by pain Patient left: in chair;with call bell/phone within reach  GP      Rebeca Alert, MPT Pager: [phone]

## 2012-12-10 DIAGNOSIS — E1169 Type 2 diabetes mellitus with other specified complication: Secondary | ICD-10-CM

## 2012-12-10 DIAGNOSIS — L97509 Non-pressure chronic ulcer of other part of unspecified foot with unspecified severity: Secondary | ICD-10-CM

## 2012-12-10 DIAGNOSIS — M869 Osteomyelitis, unspecified: Secondary | ICD-10-CM

## 2012-12-10 DIAGNOSIS — M9080 Osteopathy in diseases classified elsewhere, unspecified site: Secondary | ICD-10-CM

## 2012-12-10 LAB — CBC
HCT: 26.4 % — ABNORMAL LOW (ref 39.0–52.0)
MCHC: 31.8 g/dL (ref 30.0–36.0)
MCV: 74.6 fL — ABNORMAL LOW (ref 78.0–100.0)
RDW: 14.3 % (ref 11.5–15.5)

## 2012-12-10 LAB — BASIC METABOLIC PANEL
BUN: 14 mg/dL (ref 6–23)
Creatinine, Ser: 1.24 mg/dL (ref 0.50–1.35)
GFR calc Af Amer: 75 mL/min — ABNORMAL LOW (ref >90)
GFR calc non Af Amer: 65 mL/min — ABNORMAL LOW (ref >90)

## 2012-12-10 LAB — GLUCOSE, CAPILLARY
Glucose-Capillary: 175 mg/dL — ABNORMAL HIGH (ref 70–99)
Glucose-Capillary: 147 mg/dL — ABNORMAL HIGH (ref 70–99)
Glucose-Capillary: 141 mg/dL — ABNORMAL HIGH (ref 70–99)

## 2012-12-10 LAB — HIV ANTIBODY (ROUTINE TESTING W REFLEX): HIV: NONREACTIVE

## 2012-12-10 LAB — HEMOGLOBIN A1C: Mean Plasma Glucose: 148 mg/dL — ABNORMAL HIGH (ref <117)

## 2012-12-10 MED ORDER — BISACODYL 5 MG PO TBEC
10.0000 mg | DELAYED_RELEASE_TABLET | Freq: Two times a day (BID) | ORAL | Status: DC
  Administered 2012-12-10 – 2012-12-13 (×6): 10 mg via ORAL
  Filled 2012-12-10 (×2): qty 2
  Filled 2012-12-10: qty 1
  Filled 2012-12-10 (×4): qty 2
  Filled 2012-12-10: qty 1
  Filled 2012-12-10: qty 2

## 2012-12-10 MED ORDER — PANTOPRAZOLE SODIUM 40 MG PO TBEC
40.0000 mg | DELAYED_RELEASE_TABLET | Freq: Every day | ORAL | Status: DC
  Administered 2012-12-11 – 2012-12-18 (×6): 40 mg via ORAL
  Filled 2012-12-10 (×8): qty 1

## 2012-12-10 MED ORDER — POLYETHYLENE GLYCOL 3350 17 G PO PACK
17.0000 g | PACK | Freq: Two times a day (BID) | ORAL | Status: DC
  Administered 2012-12-10 – 2012-12-16 (×6): 17 g via ORAL
  Filled 2012-12-10 (×15): qty 1

## 2012-12-10 NOTE — Progress Notes (Signed)
Patient ID: Keith Guzman, male   DOB: 1961/05/28, 52 y.o.   MRN: [id number]         [facility]    Date of Admission:  12/07/2012           Day 4 vancomycin        Day 4 piperacillin tazobactam  Principal Problem:   Sepsis Active Problems:   Type II or unspecified type diabetes mellitus with unspecified complication, uncontrolled   ANEMIA, microcytic- Hgb down to 6.7 12/07/12- transfused   PVD, LSFA PTA 11/29/12   Non-healing ulcer of foot   HTN (hypertension), poor control   History of smoking. quit 2005   Sinus tachycardia   Skin ulcer of left lower leg   Osteomyelitis of ankle, left foot and ankle- MRI 12/07/12   . aspirin EC  162 mg Oral Daily  . bisacodyl  10 mg Oral q12n4p  . collagenase  1 application Topical q morning - 10a  . docusate sodium  100 mg Oral BID  . enoxaparin (LOVENOX) injection  40 mg Subcutaneous Q24H  . feeding supplement  237 mL Oral BID BM  . glipiZIDE  10 mg Oral BID AC  . insulin aspart  0-15 Units Subcutaneous TID WC  . lisinopril  20 mg Oral Daily  . metoprolol tartrate  25 mg Oral BID  . multivitamin with minerals  1 tablet Oral Daily  . nutrition supplement  1 packet Oral BID BM  . pantoprazole  40 mg Oral BID  . piperacillin-tazobactam (ZOSYN)  IV  3.375 g Intravenous Q8H  . polyethylene glycol  17 g Oral BID  . simvastatin  20 mg Oral q1800  . sodium chloride  3 mL Intravenous Q12H  . vancomycin  750 mg Intravenous Q8H   Objective: Temp:  [99.4 F (37.4 C)-100.9 F (38.3 C)] 99.4 F (37.4 C) (03/15 0547) Pulse Rate:  [104-111] 105 (03/15 1038) Resp:  [14-16] 14 (03/15 0547) BP: (126-146)/(71-85) 146/85 mmHg (03/15 1038) SpO2:  [96 %-97 %] 97 % (03/15 0547)  Lab Results Lab Results  Component Value Date   WBC 24.2* 12/10/2012   HGB 8.4* 12/10/2012   HCT 26.4* 12/10/2012   MCV 74.6* 12/10/2012   PLT 483* 12/10/2012    Lab Results  Component Value Date   CREATININE 1.24 12/10/2012   BUN 14 12/10/2012   NA  131* 12/10/2012   K 3.6 12/10/2012   CL 94* 12/10/2012   CO2 27 12/10/2012    Lab Results  Component Value Date   ALT 14 12/07/2012   AST 16 12/07/2012   ALKPHOS 83 12/07/2012   BILITOT 0.7 12/07/2012      Microbiology: Recent Results (from the past 240 hour(s))  CULTURE, BLOOD (ROUTINE X 2)     Status: None   Collection Time    12/07/12 10:00 AM      Result Value Range Status   Specimen Description BLOOD LEFT FOREARM   Final   Special Requests BOTTLES DRAWN AEROBIC ONLY   Final   Culture  Setup Time 12/07/2012 13:45   Final   Culture     Final   Value:        BLOOD CULTURE RECEIVED NO GROWTH TO DATE CULTURE WILL BE HELD FOR 5 DAYS BEFORE ISSUING A FINAL NEGATIVE REPORT   Report Status PENDING   Incomplete  URINE CULTURE     Status: None   Collection Time    12/07/12 10:40 AM  Result Value Range Status   Specimen Description URINE, CLEAN CATCH   Final   Special Requests NONE   Final   Culture  Setup Time 12/07/2012 13:48   Final   Colony Count NO GROWTH   Final   Culture NO GROWTH   Final   Report Status 12/08/2012 FINAL   Final  CULTURE, BLOOD (ROUTINE X 2)     Status: None   Collection Time    12/07/12 10:50 AM      Result Value Range Status   Specimen Description BLOOD EFT FOREARM   Final   Special Requests BOTTLES DRAWN AEROBIC ONLY 5CC   Final   Culture  Setup Time 12/07/2012 13:45   Final   Culture     Final   Value:        BLOOD CULTURE RECEIVED NO GROWTH TO DATE CULTURE WILL BE HELD FOR 5 DAYS BEFORE ISSUING A FINAL NEGATIVE REPORT   Report Status PENDING   Incomplete  MRSA PCR SCREENING     Status: None   Collection Time    12/08/12  4:33 AM      Result Value Range Status   MRSA by PCR NEGATIVE  NEGATIVE Final   Comment:            The GeneXpert MRSA Assay (FDA     approved for NASAL specimens     only), is one component of a     comprehensive MRSA colonization     surveillance program. It is not     intended to diagnose MRSA     infection nor to  guide or     monitor treatment for     MRSA infections.  WOUND CULTURE     Status: None   Collection Time    12/09/12  4:31 PM      Result Value Range Status   Specimen Description FOOT   Final   Special Requests Normal   Final   Gram Stain     Final   Value: NO WBC SEEN     RARE SQUAMOUS EPITHELIAL CELLS PRESENT     RARE GRAM POSITIVE COCCI IN PAIRS   Culture NO GROWTH   Final   Report Status PENDING   Incomplete   Assessment: His foot wound Gram stain showed gram-positive cocci in pairs blood cultures are negative at 24 hours. I will continue his current broad empiric antibiotic therapy.  Plan: 1. Continue current antibiotic therapy pending final culture results  Cliffton Asters, MD [facility] for Infectious Disease [facility] [phone] pager   [phone] cell 12/10/2012, 3:27 PM

## 2012-12-10 NOTE — Progress Notes (Signed)
Came in insert PICC for home antibiotics.   Expressed concern whether their insurance would cover PICC and cost of antibiotic at home.   Pt. Requested to wait until Monday when social worker is suppose to let them know something about what insurance will and will not cover in regards to this.  I explained the PICC procedure to them and they are agreeable to having the PICC if things work out with insurance.   Assisted them with watching the educational video regarding PICC insertion.  Answered their questions and informed them we would check back with them on Monday.     Primary RN also made aware.

## 2012-12-10 NOTE — Progress Notes (Signed)
[facility] PROGRESS NOTE  MAYJOR AGER HQI:[id number] DOB: 1961-01-08 DOA: 12/07/2012 PCP: Laurell Josephs, MD  Assessment/Plan: Sepsis : resolved - Source is likely necrotic ulcer at base of great toe, +/_ Osteomyelitis - continue IV vancomycin and Zosyn  - FU Blood cultures -NGTD - MRI without foot abscess but some lateral calcaneal tuberosity osteomyelitis - fever curve down, but WBC still UP - ID Dr.Hatcher following  L great toe: ischemia with necrotic ulcer: PVD, and possible osteomyelitis in lateral calcaneous tuberosity - LSFA PTA 11/29/12 per Dr.Berry, followed by 2 vessel run off - continue IV Abx as above, d/w Dr.Berry, he recommends abx therapy at this point - Ortho Dr.Thompson signed off - appreciate ID consult - PICC to be placed for long term IV abx  Nausea and vomiting  Possibly related to underlying sepsis. improved, tolerating regular diet   Constipation: worsened by narcotics -  increase miralax to BID, add dulcolax and senokot  Uncontrolled Type 2 diabetes mellitus  Hemoglobin A1c close to 12,  2 months back, repeat HBaic  Resumed glipizide, CBGs somewhat better diabetic coordinator consult.   Iron deficiency anemia  Anemia panel suggestive of iron deficiency.. No workup done in the past. Baseline hemoglobin around 9., s/p 2 units PRBC 3/13 Needs GI workup as outpatient, start Fe at DC   Hypertension  Currently stable. Continue metoprolol and lisinopril   Possible CAD: stress test per cards as outpatient  TX to floor  Code Status: Full  Family Communication: none at bedside Disposition Plan: home when improved   Consultants:  Dr.Thompson, Ortho  Dr.Berry, [facility]  ID: Dr.Hatcher    Antibiotics:  Vanc 3/12  Zosyn 3/12  HPI/Subjective: C/o constipation and nausea, fevers down, no abdominal pain  Objective: Filed Vitals:   12/09/12 1400 12/09/12 2123 12/10/12 0547 12/10/12 1038  BP: 116/64 126/71 144/78 146/85  Pulse: 100 111  104 105  Temp: 99.8 F (37.7 C) 100.9 F (38.3 C) 99.4 F (37.4 C)   TempSrc: Oral Oral Oral   Resp: 14 16 14    Height:      Weight:      SpO2: 95% 96% 97%     Intake/Output Summary (Last 24 hours) at 12/10/12 1131 Last data filed at 12/10/12 1111  Gross per 24 hour  Intake 2642.5 ml  Output   1625 ml  Net 1017.5 ml   Filed Weights   12/07/12 1538 12/07/12 1713 12/08/12 0400  Weight: 72.576 kg (160 lb) 67.5 kg (148 lb 13 oz) 68.1 kg (150 lb 2.1 oz)    Exam: GEn: AAOx3, no distress HEENT: PERRLA, Mouth: no erythema or exudates, dry oral mucosa.  Cardiovascular: S1/S2/RRR, no m/r/g Pulmonary/Chest: CTAB, no wheezes, rales, or rhonchi  Abdominal: Soft. NT, distended, bowel sounds are normal, no masses, organomegaly, or guarding present.  GU: no CVA tenderness Musculoskeletal: No joint deformities, erythema, or stiffness, ROM full and no nontender Ext:  4x 3 cm ulcer over left heel, with scaly skin at margins .  Great toe is dark and cold,  With full thickness ulcer at base, foul-smelling discharge noted.  Data Reviewed: Basic Metabolic Panel:  Recent Labs Lab 12/07/12 1025 12/08/12 0750 12/09/12 0418 12/10/12 0530  NA 133* 131* 133* 131*  K 4.0 3.6 3.3* 3.6  CL 92* 94* 96 94*  CO2 22 25 28 27   GLUCOSE 226* 161* 192* 159*  BUN 13 8 8 14   CREATININE 0.65 0.75 0.87 1.24  CALCIUM 9.0 7.8* 7.6* 7.8*   Liver Function  Tests:  Recent Labs Lab 12/07/12 1025  AST 16  ALT 14  ALKPHOS 83  BILITOT 0.7  PROT 7.7  ALBUMIN 2.6*    Recent Labs Lab 12/07/12 1025 12/09/12 1711  LIPASE 13 13  AMYLASE  --  30   No results found for this basename: AMMONIA,  in the last 168 hours CBC:  Recent Labs Lab 12/07/12 1050 12/08/12 0750 12/09/12 0418 12/10/12 0530  WBC 17.6* 16.2* 21.7* 24.2*  NEUTROABS 15.0* 13.3*  --   --   HGB 7.7* 6.7* 8.5* 8.4*  HCT 23.9* 20.8* 26.1* 26.4*  MCV 72.2* 72.7* 73.9* 74.6*  PLT 456* 415* 445* 483*   Cardiac Enzymes: No results  found for this basename: CKTOTAL, CKMB, CKMBINDEX, TROPONINI,  in the last 168 hours BNP (last 3 results) No results found for this basename: PROBNP,  in the last 8760 hours CBG:  Recent Labs Lab 12/09/12 0718 12/09/12 1233 12/09/12 1707 12/09/12 2127 12/10/12 0726  GLUCAP 255* 126* 180* 168* 151*    Recent Results (from the past 240 hour(s))  CULTURE, BLOOD (ROUTINE X 2)     Status: None   Collection Time    12/07/12 10:00 AM      Result Value Range Status   Specimen Description BLOOD LEFT FOREARM   Final   Special Requests BOTTLES DRAWN AEROBIC ONLY   Final   Culture  Setup Time 12/07/2012 13:45   Final   Culture     Final   Value:        BLOOD CULTURE RECEIVED NO GROWTH TO DATE CULTURE WILL BE HELD FOR 5 DAYS BEFORE ISSUING A FINAL NEGATIVE REPORT   Report Status PENDING   Incomplete  URINE CULTURE     Status: None   Collection Time    12/07/12 10:40 AM      Result Value Range Status   Specimen Description URINE, CLEAN CATCH   Final   Special Requests NONE   Final   Culture  Setup Time 12/07/2012 13:48   Final   Colony Count NO GROWTH   Final   Culture NO GROWTH   Final   Report Status 12/08/2012 FINAL   Final  CULTURE, BLOOD (ROUTINE X 2)     Status: None   Collection Time    12/07/12 10:50 AM      Result Value Range Status   Specimen Description BLOOD EFT FOREARM   Final   Special Requests BOTTLES DRAWN AEROBIC ONLY 5CC   Final   Culture  Setup Time 12/07/2012 13:45   Final   Culture     Final   Value:        BLOOD CULTURE RECEIVED NO GROWTH TO DATE CULTURE WILL BE HELD FOR 5 DAYS BEFORE ISSUING A FINAL NEGATIVE REPORT   Report Status PENDING   Incomplete  MRSA PCR SCREENING     Status: None   Collection Time    12/08/12  4:33 AM      Result Value Range Status   MRSA by PCR NEGATIVE  NEGATIVE Final   Comment:            The GeneXpert MRSA Assay (FDA     approved for NASAL specimens     only), is one component of a     comprehensive MRSA colonization      surveillance program. It is not     intended to diagnose MRSA     infection nor to guide or  monitor treatment for     MRSA infections.  WOUND CULTURE     Status: None   Collection Time    12/09/12  4:31 PM      Result Value Range Status   Specimen Description FOOT   Final   Special Requests Normal   Final   Gram Stain     Final   Value: NO WBC SEEN     RARE SQUAMOUS EPITHELIAL CELLS PRESENT     RARE GRAM POSITIVE COCCI IN PAIRS   Culture NO GROWTH   Final   Report Status PENDING   Incomplete     Studies: No results found.  Scheduled Meds: . aspirin EC  162 mg Oral Daily  . collagenase  1 application Topical q morning - 10a  . docusate sodium  100 mg Oral BID  . enoxaparin (LOVENOX) injection  40 mg Subcutaneous Q24H  . feeding supplement  237 mL Oral BID BM  . glipiZIDE  10 mg Oral BID AC  . insulin aspart  0-15 Units Subcutaneous TID WC  . lisinopril  20 mg Oral Daily  . metoprolol tartrate  25 mg Oral BID  . multivitamin with minerals  1 tablet Oral Daily  . nutrition supplement  1 packet Oral BID BM  . pantoprazole  40 mg Oral BID  . piperacillin-tazobactam (ZOSYN)  IV  3.375 g Intravenous Q8H  . simvastatin  20 mg Oral q1800  . sodium chloride  3 mL Intravenous Q12H  . vancomycin  750 mg Intravenous Q8H   Continuous Infusions: . sodium chloride 50 mL/hr at 12/09/12 1500    Principal Problem:   Sepsis Active Problems:   Type II or unspecified type diabetes mellitus with unspecified complication, uncontrolled   ANEMIA, microcytic- Hgb down to 6.7 12/07/12- transfused   PVD, LSFA PTA 11/29/12   Non-healing ulcer of foot   HTN (hypertension), poor control   History of smoking. quit 2005   Sinus tachycardia   Skin ulcer of left lower leg   Osteomyelitis of ankle, left foot and ankle- MRI 12/07/12    Time spent:75min    [name]  [facility] Pager [phone]. If 7PM-7AM, please contact night-coverage at [email or web address], password  [facility] 12/10/2012, 11:31 AM  LOS: 3 days

## 2012-12-10 NOTE — Plan of Care (Signed)
Problem: Phase II Progression Outcomes Goal: IV changed to normal saline lock Outcome: Not Applicable Date Met:  12/10/12 Pt for PICC placement for home IV antibiotics.

## 2012-12-10 NOTE — Care Management (Signed)
Cm spoke with patient with spouse Corrie Dandy present at bedside. Per MD CM consult pt will require HHRN for home IV ABX. PT eval suggest HHPT upon discharge. Pt offered list for [facility]. No choice made at this time. Benefits check submitted for choice of HH agencies within patient's insurance network. Unit CM to follow up during the week. Pt request RW as suggested by PT eval.   [name] [phone]

## 2012-12-11 ENCOUNTER — Inpatient Hospital Stay (HOSPITAL_COMMUNITY): Payer: BC Managed Care – PPO

## 2012-12-11 LAB — GLUCOSE, CAPILLARY
Glucose-Capillary: 184 mg/dL — ABNORMAL HIGH (ref 70–99)
Glucose-Capillary: 151 mg/dL — ABNORMAL HIGH (ref 70–99)

## 2012-12-11 LAB — CBC
Platelets: 479 10*3/uL — ABNORMAL HIGH (ref 150–400)
RBC: 3.42 MIL/uL — ABNORMAL LOW (ref 4.22–5.81)
WBC: 17.1 10*3/uL — ABNORMAL HIGH (ref 4.0–10.5)

## 2012-12-11 LAB — VANCOMYCIN, TROUGH: Vancomycin Tr: 25.7 ug/mL (ref 10.0–20.0)

## 2012-12-11 LAB — BASIC METABOLIC PANEL
CO2: 30 mEq/L (ref 19–32)
Calcium: 7.9 mg/dL — ABNORMAL LOW (ref 8.4–10.5)
Chloride: 96 mEq/L (ref 96–112)
Sodium: 133 mEq/L — ABNORMAL LOW (ref 135–145)

## 2012-12-11 MED ORDER — FLEET ENEMA 7-19 GM/118ML RE ENEM: 1.0000 | ENEMA | Freq: Once | RECTAL | Status: DC

## 2012-12-11 MED ORDER — VANCOMYCIN HCL 1000 MG IV SOLR
750.0000 mg | Freq: Two times a day (BID) | INTRAVENOUS | Status: DC
  Administered 2012-12-12: 750 mg via INTRAVENOUS
  Filled 2012-12-11 (×2): qty 750

## 2012-12-11 NOTE — Progress Notes (Signed)
Paged Dr. Jomarie Longs that patient had emesis episode x1 this am after meds and was given zofran, also KUB results available.

## 2012-12-11 NOTE — Progress Notes (Signed)
Pt reported to this nurse that he was waiting to see if "PICC" would be covered by insurance before he has it inserted????No sign of this in the chart. Pt clearly misunderstands and needs clarification.

## 2012-12-11 NOTE — Progress Notes (Signed)
Vancomycin and Zosyn Consult  Please see progress notes by Perlie Gold earlier today for more details.  Vancomycin trough returns as 25.7 (supratherapeutic) on Vancomycin 750 mg IV q8h.  Vancomycin trough was low at 10.4 on 3/14 when patient was on Vancomycin 1250 mg IV q12h.  Scr increased from 0.87 on 3/14 to 1.24 on 3/15.    Plan:  Hold vancomycin for now.  Resume Vancomycin tomorrow at 0600 with 750 mg IV q12h.  Please monitor vancomycin trough outpatient in ~3 days and check Scr weekly.    Thanks  Geoffry Paradise, PharmD, BCPS Pager: [phone] 8:20 PM Pharmacy #: [phone]

## 2012-12-11 NOTE — Progress Notes (Signed)
ANTIBIOTIC CONSULT NOTE - FOLLOW UP  Pharmacy Consult for Vancomycin, Zosyn Indication: LLE infection with osteomyelitis  No Known Allergies  Patient Measurements: Height: 5' 11.5" (181.6 cm) Weight: 150 lb 2.1 oz (68.1 kg) IBW/kg (Calculated) : 76.45  Vital Signs: Temp: 98.2 F (36.8 C) (03/16 0600) Temp src: Oral (03/16 0600) BP: 152/96 mmHg (03/16 1027) Pulse Rate: 105 (03/16 1027) Intake/Output from previous day: 03/15 0701 - 03/16 0700 In: 1369.2 [P.O.:240; I.V.:579.2; IV Piggyback:550] Out: 850 [Urine:850] Intake/Output from this shift: Total I/O In: 1073.3 [P.O.:240; I.V.:833.3] Out: 600 [Urine:600]  Labs:  Recent Labs  12/09/12 0418 12/10/12 0530 12/11/12 0416  WBC 21.7* 24.2* 17.1*  HGB 8.5* 8.4* 8.0*  PLT 445* 483* 479*  CREATININE 0.87 1.24 1.21   Estimated Creatinine Clearance: 68 ml/min (by C-G formula based on Cr of 1.21).  Recent Labs  12/09/12 1136  VANCOTROUGH 10.4     Microbiology: Recent Results (from the past 720 hour(s))  CULTURE, BLOOD (ROUTINE X 2)     Status: None   Collection Time    12/07/12 10:00 AM      Result Value Range Status   Specimen Description BLOOD LEFT FOREARM   Final   Special Requests BOTTLES DRAWN AEROBIC ONLY   Final   Culture  Setup Time 12/07/2012 13:45   Final   Culture     Final   Value:        BLOOD CULTURE RECEIVED NO GROWTH TO DATE CULTURE WILL BE HELD FOR 5 DAYS BEFORE ISSUING A FINAL NEGATIVE REPORT   Report Status PENDING   Incomplete  URINE CULTURE     Status: None   Collection Time    12/07/12 10:40 AM      Result Value Range Status   Specimen Description URINE, CLEAN CATCH   Final   Special Requests NONE   Final   Culture  Setup Time 12/07/2012 13:48   Final   Colony Count NO GROWTH   Final   Culture NO GROWTH   Final   Report Status 12/08/2012 FINAL   Final  CULTURE, BLOOD (ROUTINE X 2)     Status: None   Collection Time    12/07/12 10:50 AM      Result Value Range Status   Specimen  Description BLOOD EFT FOREARM   Final   Special Requests BOTTLES DRAWN AEROBIC ONLY 5CC   Final   Culture  Setup Time 12/07/2012 13:45   Final   Culture     Final   Value:        BLOOD CULTURE RECEIVED NO GROWTH TO DATE CULTURE WILL BE HELD FOR 5 DAYS BEFORE ISSUING A FINAL NEGATIVE REPORT   Report Status PENDING   Incomplete  MRSA PCR SCREENING     Status: None   Collection Time    12/08/12  4:33 AM      Result Value Range Status   MRSA by PCR NEGATIVE  NEGATIVE Final   Comment:            The GeneXpert MRSA Assay (FDA     approved for NASAL specimens     only), is one component of a     comprehensive MRSA colonization     surveillance program. It is not     intended to diagnose MRSA     infection nor to guide or     monitor treatment for     MRSA infections.  WOUND CULTURE     Status: None  Collection Time    12/09/12  4:31 PM      Result Value Range Status   Specimen Description FOOT   Final   Special Requests Normal   Final   Gram Stain     Final   Value: NO WBC SEEN     RARE SQUAMOUS EPITHELIAL CELLS PRESENT     RARE GRAM POSITIVE COCCI IN PAIRS   Culture NO GROWTH   Final   Report Status PENDING   Incomplete    Anti-infectives   Start     Dose/Rate Route Frequency Ordered Stop   12/09/12 2000  vancomycin (VANCOCIN) 750 mg in sodium chloride 0.9 % 150 mL IVPB     750 mg 150 mL/hr over 60 Minutes Intravenous Every 8 hours 12/09/12 1252     12/08/12 0000  vancomycin (VANCOCIN) 1,250 mg in sodium chloride 0.9 % 250 mL IVPB  Status:  Discontinued     1,250 mg 166.7 mL/hr over 90 Minutes Intravenous Every 12 hours 12/07/12 1027 12/09/12 1251   12/07/12 2000  piperacillin-tazobactam (ZOSYN) IVPB 3.375 g     3.375 g 12.5 mL/hr over 240 Minutes Intravenous 3 times per day 12/07/12 1027     12/07/12 1800  piperacillin-tazobactam (ZOSYN) IVPB 3.375 g  Status:  Discontinued     3.375 g 12.5 mL/hr over 240 Minutes Intravenous 3 times per day 12/07/12 1602 12/07/12 1615    12/07/12 1130  vancomycin (VANCOCIN) 1,250 mg in sodium chloride 0.9 % 250 mL IVPB     1,250 mg 166.7 mL/hr over 90 Minutes Intravenous  Once 12/07/12 1026 12/07/12 1354   12/07/12 1100  piperacillin-tazobactam (ZOSYN) IVPB 3.375 g     3.375 g 100 mL/hr over 30 Minutes Intravenous STAT 12/07/12 1026 12/07/12 1224     Assessment: 51 yoM with L heel ulcer admitted from wound center 3/12 for wound debridement, IV abx, vascular evaluation. Angiography 3/4 with atherectomy of distal L SFA, pt d/c home 3/5. Vancomycin and Zosyn per Rx starting 3/12.  Osteomyelitis per MRI, no abscess  Day 4 Vancomycin, dose increased to 750mg  q8h after subtherapeutic trough 3/14  PICC was to be placed 3/15, postponed to 3/17; plan home IV abx  Goal of Therapy:  Vancomycin trough level 15-20 mcg/ml  Plan:  No change Vancomycin 750mg  q8 No change Zosyn Vancomycin trough tonight @ 1900; assess level prior to discharge to guide dosing. (Avoiding a 3am level 3/17 am)  Otho Bellows PharmD Pager [phone] 12/11/2012, 11:45 AM

## 2012-12-11 NOTE — Progress Notes (Signed)
[facility] PROGRESS NOTE  Keith Guzman:[id number] DOB: [birth year] DOA: 12/07/2012 PCP: Laurell Josephs, MD  Assessment/Plan: Sepsis : resolved - Source is likely necrotic ulcer at base of great toe, +/_ Osteomyelitis - continue IV vancomycin and Zosyn  - FU Blood cultures -NGTD, wound Cx :few GPC in pairs - MRI without foot abscess but some lateral calcaneal tuberosity osteomyelitis - fever curve down, WBC finally starting to trend down - ID Dr.Hatcher following  L great toe: ischemia with necrotic ulcer: PVD, and possible osteomyelitis in lateral calcaneous tuberosity - LSFA PTA 11/29/12 per Dr.Berry, followed by 2 vessel run off - continue IV Abx as above, d/w Dr.Berry, he recommends abx therapy at this point - Ortho Dr.Thompson signed off - appreciate ID consult - PICC to be placed for long term IV abx  Nausea and vomiting  -Possibly related to underlying sepsis. -improved, then worsened due to constipation , see below  Constipation: worsened by narcotics -  increase miralax to BID, continue dulcolax and senokot - check KUB today  Uncontrolled Type 2 diabetes mellitus  -Hemoglobin A1c close to 12,  2 months back, repeat HBaic 6.8 -Resumed glipizide, CBGs somewhat better -diabetic coordinator consulted  Iron deficiency anemia  -Anemia panel suggestive of iron deficiency.. No workup done in the past. -Baseline hemoglobin around 9., s/p 2 units PRBC 3/13 -Needs GI workup as outpatient, start Fe at DC   Hypertension  -Currently stable. Continue metoprolol and lisinopril   Possible CAD:  -stress test per cards as outpatient  TX to floor  Code Status: Full  Family Communication: d/w pt and wife at bedside Disposition Plan: home when improved   Consultants:  Dr.Thompson, Kandyce Rud, [facility]  ID: Dr.Hatcher    Antibiotics:  Vanc 3/12  Zosyn 3/12  HPI/Subjective: Still constipated despite miralax and dulcolax,  abdominal pain No further  fevers  Objective: Filed Vitals:   12/10/12 1603 12/10/12 2115 12/11/12 0600 12/11/12 1027  BP: 147/88 148/67 147/85 152/96  Pulse: 103 112 102 105  Temp: 99.6 F (37.6 C) 100.5 F (38.1 C) 98.2 F (36.8 C)   TempSrc: Oral Oral Oral   Resp: 14 20 12    Height:      Weight:      SpO2: 96% 98% 98%     Intake/Output Summary (Last 24 hours) at 12/11/12 1328 Last data filed at 12/11/12 1054  Gross per 24 hour  Intake 1974.17 ml  Output   1250 ml  Net 724.17 ml   Filed Weights   12/07/12 1538 12/07/12 1713 12/08/12 0400  Weight: 72.576 kg (160 lb) 67.5 kg (148 lb 13 oz) 68.1 kg (150 lb 2.1 oz)    Exam: GEn: AAOx3, no distress HEENT: PERRLA, Mouth: no erythema or exudates, dry oral mucosa.  Cardiovascular: S1/S2/RRR, no m/r/g Pulmonary/Chest: CTAB, no wheezes, rales, or rhonchi  Abdominal: Soft. NT, distended, bowel sounds are normal, no masses, organomegaly, or guarding present.  GU: no CVA tenderness Musculoskeletal: No joint deformities, erythema, or stiffness, ROM full and no nontender Ext:  4x 3 cm ulcer over left heel, with scaly skin at margins .  Great toe is dark and cold,  With full thickness ulcer at base, foul-smelling discharge noted.  Data Reviewed: Basic Metabolic Panel:  Recent Labs Lab 12/07/12 1025 12/08/12 0750 12/09/12 0418 12/10/12 0530 12/11/12 0416  NA 133* 131* 133* 131* 133*  K 4.0 3.6 3.3* 3.6 3.4*  CL 92* 94* 96 94* 96  CO2 22 25 28 27  30  GLUCOSE 226* 161* 192* 159* 194*  BUN 13 8 8 14 14   CREATININE 0.65 0.75 0.87 1.24 1.21  CALCIUM 9.0 7.8* 7.6* 7.8* 7.9*   Liver Function Tests:  Recent Labs Lab 12/07/12 1025  AST 16  ALT 14  ALKPHOS 83  BILITOT 0.7  PROT 7.7  ALBUMIN 2.6*    Recent Labs Lab 12/07/12 1025 12/09/12 1711  LIPASE 13 13  AMYLASE  --  30   No results found for this basename: AMMONIA,  in the last 168 hours CBC:  Recent Labs Lab 12/07/12 1050 12/08/12 0750 12/09/12 0418 12/10/12 0530  12/11/12 0416  WBC 17.6* 16.2* 21.7* 24.2* 17.1*  NEUTROABS 15.0* 13.3*  --   --   --   HGB 7.7* 6.7* 8.5* 8.4* 8.0*  HCT 23.9* 20.8* 26.1* 26.4* 25.4*  MCV 72.2* 72.7* 73.9* 74.6* 74.3*  PLT 456* 415* 445* 483* 479*   Cardiac Enzymes: No results found for this basename: CKTOTAL, CKMB, CKMBINDEX, TROPONINI,  in the last 168 hours BNP (last 3 results) No results found for this basename: PROBNP,  in the last 8760 hours CBG:  Recent Labs Lab 12/10/12 1214 12/10/12 1633 12/10/12 2158 12/11/12 0737 12/11/12 1208  GLUCAP 175* 147* 141* 184* 145*    Recent Results (from the past 240 hour(s))  CULTURE, BLOOD (ROUTINE X 2)     Status: None   Collection Time    12/07/12 10:00 AM      Result Value Range Status   Specimen Description BLOOD LEFT FOREARM   Final   Special Requests BOTTLES DRAWN AEROBIC ONLY   Final   Culture  Setup Time 12/07/2012 13:45   Final   Culture     Final   Value:        BLOOD CULTURE RECEIVED NO GROWTH TO DATE CULTURE WILL BE HELD FOR 5 DAYS BEFORE ISSUING A FINAL NEGATIVE REPORT   Report Status PENDING   Incomplete  URINE CULTURE     Status: None   Collection Time    12/07/12 10:40 AM      Result Value Range Status   Specimen Description URINE, CLEAN CATCH   Final   Special Requests NONE   Final   Culture  Setup Time 12/07/2012 13:48   Final   Colony Count NO GROWTH   Final   Culture NO GROWTH   Final   Report Status 12/08/2012 FINAL   Final  CULTURE, BLOOD (ROUTINE X 2)     Status: None   Collection Time    12/07/12 10:50 AM      Result Value Range Status   Specimen Description BLOOD EFT FOREARM   Final   Special Requests BOTTLES DRAWN AEROBIC ONLY 5CC   Final   Culture  Setup Time 12/07/2012 13:45   Final   Culture     Final   Value:        BLOOD CULTURE RECEIVED NO GROWTH TO DATE CULTURE WILL BE HELD FOR 5 DAYS BEFORE ISSUING A FINAL NEGATIVE REPORT   Report Status PENDING   Incomplete  MRSA PCR SCREENING     Status: None   Collection Time     12/08/12  4:33 AM      Result Value Range Status   MRSA by PCR NEGATIVE  NEGATIVE Final   Comment:            The GeneXpert MRSA Assay (FDA     approved for NASAL specimens     only), is  one component of a     comprehensive MRSA colonization     surveillance program. It is not     intended to diagnose MRSA     infection nor to guide or     monitor treatment for     MRSA infections.  WOUND CULTURE     Status: None   Collection Time    12/09/12  4:31 PM      Result Value Range Status   Specimen Description FOOT   Final   Special Requests Normal   Final   Gram Stain     Final   Value: NO WBC SEEN     RARE SQUAMOUS EPITHELIAL CELLS PRESENT     RARE GRAM POSITIVE COCCI IN PAIRS   Culture NO GROWTH   Final   Report Status PENDING   Incomplete     Studies: Dg Abd 1 View  12/11/2012  *RADIOLOGY REPORT*  Clinical Data: Abdominal pain.  Abdominal distention. Constipation.  ABDOMEN - 1 VIEW  Comparison: 12/07/2012  Findings: Gas pattern is normal without evidence of ileus, obstruction or apparent free air.  There is a normal amount of fecal matter in the colon.  No abnormal calcifications or acute bony findings.  IMPRESSION: Gas pattern and fecal burden within normal limits.   Original Report Authenticated By: Paulina Fusi, M.D.     Scheduled Meds: . aspirin EC  162 mg Oral Daily  . bisacodyl  10 mg Oral q12n4p  . collagenase  1 application Topical q morning - 10a  . docusate sodium  100 mg Oral BID  . enoxaparin (LOVENOX) injection  40 mg Subcutaneous Q24H  . feeding supplement  237 mL Oral BID BM  . glipiZIDE  10 mg Oral BID AC  . insulin aspart  0-15 Units Subcutaneous TID WC  . lisinopril  20 mg Oral Daily  . metoprolol tartrate  25 mg Oral BID  . multivitamin with minerals  1 tablet Oral Daily  . nutrition supplement  1 packet Oral BID BM  . pantoprazole  40 mg Oral Daily  . piperacillin-tazobactam (ZOSYN)  IV  3.375 g Intravenous Q8H  . polyethylene glycol  17 g Oral BID   . simvastatin  20 mg Oral q1800  . sodium chloride  3 mL Intravenous Q12H  . sodium phosphate  1 enema Rectal Once  . vancomycin  750 mg Intravenous Q8H   Continuous Infusions: . sodium chloride 50 mL/hr at 12/10/12 2051    Principal Problem:   Sepsis Active Problems:   Type II or unspecified type diabetes mellitus with unspecified complication, uncontrolled   ANEMIA, microcytic- Hgb down to 6.7 12/07/12- transfused   PVD, LSFA PTA 11/29/12   Non-healing ulcer of foot   HTN (hypertension), poor control   History of smoking. quit 2005   Sinus tachycardia   Skin ulcer of left lower leg   Osteomyelitis of ankle, left foot and ankle- MRI 12/07/12    Time spent:35min    [facility]  [facility] Pager [phone]. If 7PM-7AM, please contact night-coverage at [email or web address], password [facility] 12/11/2012, 1:28 PM  LOS: 4 days

## 2012-12-11 NOTE — Progress Notes (Signed)
Patient ID: Keith Guzman, male   DOB: September 15, 1961, 52 y.o.   MRN: [id number]         [facility]    Date of Admission:  12/07/2012           Day 5 vancomycin        Day 5 piperacillin tazobactam Microbiology: Recent Results (from the past 240 hour(s))  CULTURE, BLOOD (ROUTINE X 2)     Status: None   Collection Time    12/07/12 10:00 AM      Result Value Range Status   Specimen Description BLOOD LEFT FOREARM   Final   Special Requests BOTTLES DRAWN AEROBIC ONLY   Final   Culture  Setup Time 12/07/2012 13:45   Final   Culture     Final   Value:        BLOOD CULTURE RECEIVED NO GROWTH TO DATE CULTURE WILL BE HELD FOR 5 DAYS BEFORE ISSUING A FINAL NEGATIVE REPORT   Report Status PENDING   Incomplete  URINE CULTURE     Status: None   Collection Time    12/07/12 10:40 AM      Result Value Range Status   Specimen Description URINE, CLEAN CATCH   Final   Special Requests NONE   Final   Culture  Setup Time 12/07/2012 13:48   Final   Colony Count NO GROWTH   Final   Culture NO GROWTH   Final   Report Status 12/08/2012 FINAL   Final  CULTURE, BLOOD (ROUTINE X 2)     Status: None   Collection Time    12/07/12 10:50 AM      Result Value Range Status   Specimen Description BLOOD EFT FOREARM   Final   Special Requests BOTTLES DRAWN AEROBIC ONLY 5CC   Final   Culture  Setup Time 12/07/2012 13:45   Final   Culture     Final   Value:        BLOOD CULTURE RECEIVED NO GROWTH TO DATE CULTURE WILL BE HELD FOR 5 DAYS BEFORE ISSUING A FINAL NEGATIVE REPORT   Report Status PENDING   Incomplete  MRSA PCR SCREENING     Status: None   Collection Time    12/08/12  4:33 AM      Result Value Range Status   MRSA by PCR NEGATIVE  NEGATIVE Final   Comment:            The GeneXpert MRSA Assay (FDA     approved for NASAL specimens     only), is one component of a     comprehensive MRSA colonization     surveillance program. It is not     intended to diagnose MRSA   infection nor to guide or     monitor treatment for     MRSA infections.  WOUND CULTURE     Status: None   Collection Time    12/09/12  4:31 PM      Result Value Range Status   Specimen Description FOOT   Final   Special Requests Normal   Final   Gram Stain     Final   Value: NO WBC SEEN     RARE SQUAMOUS EPITHELIAL CELLS PRESENT     RARE GRAM POSITIVE COCCI IN PAIRS   Culture NO GROWTH   Final   Report Status PENDING   Incomplete   Plan: 1. Continue current broad empiric antibiotic therapy pending final wound  culture  Cliffton Asters, MD [facility] [phone] pager   [phone] cell 12/11/2012, 11:28 AM

## 2012-12-12 LAB — GLUCOSE, CAPILLARY
Glucose-Capillary: 148 mg/dL — ABNORMAL HIGH (ref 70–99)
Glucose-Capillary: 115 mg/dL — ABNORMAL HIGH (ref 70–99)

## 2012-12-12 LAB — WOUND CULTURE: Special Requests: NORMAL

## 2012-12-12 LAB — BASIC METABOLIC PANEL
BUN: 11 mg/dL (ref 6–23)
CO2: 28 mEq/L (ref 19–32)
Calcium: 8.2 mg/dL — ABNORMAL LOW (ref 8.4–10.5)
Creatinine, Ser: 1.17 mg/dL (ref 0.50–1.35)
Glucose, Bld: 145 mg/dL — ABNORMAL HIGH (ref 70–99)

## 2012-12-12 LAB — CBC
MCH: 24.2 pg — ABNORMAL LOW (ref 26.0–34.0)
MCV: 75 fL — ABNORMAL LOW (ref 78.0–100.0)
Platelets: 570 10*3/uL — ABNORMAL HIGH (ref 150–400)
RBC: 3.56 MIL/uL — ABNORMAL LOW (ref 4.22–5.81)

## 2012-12-12 MED ORDER — MINERAL OIL RE ENEM
1.0000 | ENEMA | Freq: Once | RECTAL | Status: AC
  Administered 2012-12-12: 1 via RECTAL
  Filled 2012-12-12: qty 1

## 2012-12-12 MED ORDER — SULFAMETHOXAZOLE-TRIMETHOPRIM 400-80 MG/5ML IV SOLN
320.0000 mg | Freq: Two times a day (BID) | INTRAVENOUS | Status: DC
  Administered 2012-12-12 – 2012-12-18 (×12): 320 mg via INTRAVENOUS
  Filled 2012-12-12 (×18): qty 20

## 2012-12-12 NOTE — Progress Notes (Addendum)
[facility] PROGRESS NOTE  Keith Guzman ZOX:[id number] DOB: July 29, 1961 DOA: 12/07/2012 PCP: Laurell Josephs, MD  Assessment/Plan: Sepsis :  - Ischemic necrotic ulcer at base of great toe, + Osteomyelitis of calcaneous - continue IV vancomycin and Zosyn, Day 5 today - Blood cultures -NGTD, wound Cx :stenotrophomonas maltophilia - MRI without foot abscess but some lateral calcaneal tuberosity osteomyelitis - fever curve down, WBC finally starting to trend down - ID Dr.Hatcher following  L great toe: ischemia with necrotic ulcer: PVD, and possible osteomyelitis in lateral calcaneous tuberosity - LSFA PTA 11/29/12 per Dr.Berry, followed by 2 vessel run off - continue IV Abx as above, d/w Dr.Berry recommended abx therapy - given progression of the necrotic ulcer on IV abx, it appears that amputation may be inevitable, will d/w Dr.Berry again - Ortho Dr.Thompson signed off - appreciate ID consult - PICC to be placed for long term IV abx  Nausea and vomiting  -Possibly related to underlying sepsis. -improved, then worsened due to constipation , see below  Constipation: worsened by narcotics -  increase miralax to BID, continue dulcolax and senokot -  KUB unremarkable, repeat enema today - clears for now  Uncontrolled Type 2 diabetes mellitus  -Hemoglobin A1c close to 12,  2 months back, repeat HBaic 6.8 -Resumed glipizide, CBGs somewhat better -diabetic coordinator consulted  Iron deficiency anemia  -Anemia panel suggestive of iron deficiency.. No workup done in the past. -Baseline hemoglobin around 9., s/p 2 units PRBC 3/13 -Needs GI workup as outpatient, start Fe at DC   Hypertension  -Currently stable. Continue metoprolol and lisinopril   Possible CAD:  -stress test per cards as outpatient  Code Status: Full  Family Communication: d/w pt and wife at bedside Disposition Plan: home when improved   Consultants:  Dr.Thompson, Kandyce Rud, [facility]  ID:  Dr.Hatcher    Antibiotics:  Vanc 3/12  Zosyn 3/12  HPI/Subjective: Still constipated despite miralax and dulcolax,  abdominal pain No further fevers R toe ulcer extending to foot  Objective: Filed Vitals:   12/11/12 1434 12/11/12 1502 12/11/12 2115 12/12/12 0530  BP: 141/110 153/84 134/76 147/77  Pulse: 117  105 97  Temp: 99.5 F (37.5 C)  98.5 F (36.9 C) 99.2 F (37.3 C)  TempSrc: Oral  Oral Oral  Resp: 16  20 18   Height:      Weight:      SpO2: 97%  95% 97%    Intake/Output Summary (Last 24 hours) at 12/12/12 1013 Last data filed at 12/12/12 0700  Gross per 24 hour  Intake 1749.17 ml  Output    900 ml  Net 849.17 ml   Filed Weights   12/07/12 1538 12/07/12 1713 12/08/12 0400  Weight: 72.576 kg (160 lb) 67.5 kg (148 lb 13 oz) 68.1 kg (150 lb 2.1 oz)    Exam: GEn: AAOx3, no distress HEENT: PERRLA, Mouth: no erythema or exudates, dry oral mucosa.  Cardiovascular: S1/S2/RRR, no m/r/g Pulmonary/Chest: CTAB, no wheezes, rales, or rhonchi  Abdominal: Soft. NT, distended, bowel sounds are normal, no masses, organomegaly, or guarding present.  GU: no CVA tenderness Musculoskeletal: No joint deformities, erythema, or stiffness, ROM full and no nontender Ext:  4x 3 cm ulcer over left heel, with scaly skin at margins .  Great toe is dark and cold,  With full thickness ulcer at base, extending to forefoot.  Data Reviewed: Basic Metabolic Panel:  Recent Labs Lab 12/08/12 0750 12/09/12 0418 12/10/12 0530 12/11/12 0416 12/12/12 0408  NA  131* 133* 131* 133* 136  K 3.6 3.3* 3.6 3.4* 3.2*  CL 94* 96 94* 96 97  CO2 25 28 27 30 28   GLUCOSE 161* 192* 159* 194* 145*  BUN 8 8 14 14 11   CREATININE 0.75 0.87 1.24 1.21 1.17  CALCIUM 7.8* 7.6* 7.8* 7.9* 8.2*   Liver Function Tests:  Recent Labs Lab 12/07/12 1025  AST 16  ALT 14  ALKPHOS 83  BILITOT 0.7  PROT 7.7  ALBUMIN 2.6*    Recent Labs Lab 12/07/12 1025 12/09/12 1711  LIPASE 13 13  AMYLASE  --   30   No results found for this basename: AMMONIA,  in the last 168 hours CBC:  Recent Labs Lab 12/07/12 1050 12/08/12 0750 12/09/12 0418 12/10/12 0530 12/11/12 0416 12/12/12 0408  WBC 17.6* 16.2* 21.7* 24.2* 17.1* 16.3*  NEUTROABS 15.0* 13.3*  --   --   --   --   HGB 7.7* 6.7* 8.5* 8.4* 8.0* 8.6*  HCT 23.9* 20.8* 26.1* 26.4* 25.4* 26.7*  MCV 72.2* 72.7* 73.9* 74.6* 74.3* 75.0*  PLT 456* 415* 445* 483* 479* 570*   Cardiac Enzymes: No results found for this basename: CKTOTAL, CKMB, CKMBINDEX, TROPONINI,  in the last 168 hours BNP (last 3 results) No results found for this basename: PROBNP,  in the last 8760 hours CBG:  Recent Labs Lab 12/11/12 0737 12/11/12 1208 12/11/12 1634 12/11/12 2117 12/12/12 0729  GLUCAP 184* 145* 143* 151* 166*    Recent Results (from the past 240 hour(s))  CULTURE, BLOOD (ROUTINE X 2)     Status: None   Collection Time    12/07/12 10:00 AM      Result Value Range Status   Specimen Description BLOOD LEFT FOREARM   Final   Special Requests BOTTLES DRAWN AEROBIC ONLY   Final   Culture  Setup Time 12/07/2012 13:45   Final   Culture     Final   Value:        BLOOD CULTURE RECEIVED NO GROWTH TO DATE CULTURE WILL BE HELD FOR 5 DAYS BEFORE ISSUING A FINAL NEGATIVE REPORT   Report Status PENDING   Incomplete  URINE CULTURE     Status: None   Collection Time    12/07/12 10:40 AM      Result Value Range Status   Specimen Description URINE, CLEAN CATCH   Final   Special Requests NONE   Final   Culture  Setup Time 12/07/2012 13:48   Final   Colony Count NO GROWTH   Final   Culture NO GROWTH   Final   Report Status 12/08/2012 FINAL   Final  CULTURE, BLOOD (ROUTINE X 2)     Status: None   Collection Time    12/07/12 10:50 AM      Result Value Range Status   Specimen Description BLOOD EFT FOREARM   Final   Special Requests BOTTLES DRAWN AEROBIC ONLY 5CC   Final   Culture  Setup Time 12/07/2012 13:45   Final   Culture     Final   Value:         BLOOD CULTURE RECEIVED NO GROWTH TO DATE CULTURE WILL BE HELD FOR 5 DAYS BEFORE ISSUING A FINAL NEGATIVE REPORT   Report Status PENDING   Incomplete  MRSA PCR SCREENING     Status: None   Collection Time    12/08/12  4:33 AM      Result Value Range Status   MRSA by PCR  NEGATIVE  NEGATIVE Final   Comment:            The GeneXpert MRSA Assay (FDA     approved for NASAL specimens     only), is one component of a     comprehensive MRSA colonization     surveillance program. It is not     intended to diagnose MRSA     infection nor to guide or     monitor treatment for     MRSA infections.  WOUND CULTURE     Status: None   Collection Time    12/09/12  4:31 PM      Result Value Range Status   Specimen Description FOOT   Final   Special Requests Normal   Final   Gram Stain     Final   Value: NO WBC SEEN     RARE SQUAMOUS EPITHELIAL CELLS PRESENT     RARE GRAM POSITIVE COCCI IN PAIRS   Culture MODERATE STENOTROPHOMONAS MALTOPHILIA   Final   Report Status 12/12/2012 FINAL   Final   Organism ID, Bacteria STENOTROPHOMONAS MALTOPHILIA   Final     Studies: Dg Abd 1 View  12/11/2012  *RADIOLOGY REPORT*  Clinical Data: Abdominal pain.  Abdominal distention. Constipation.  ABDOMEN - 1 VIEW  Comparison: 12/07/2012  Findings: Gas pattern is normal without evidence of ileus, obstruction or apparent free air.  There is a normal amount of fecal matter in the colon.  No abnormal calcifications or acute bony findings.  IMPRESSION: Gas pattern and fecal burden within normal limits.   Original Report Authenticated By: Paulina Fusi, M.D.     Scheduled Meds: . aspirin EC  162 mg Oral Daily  . bisacodyl  10 mg Oral q12n4p  . collagenase  1 application Topical q morning - 10a  . docusate sodium  100 mg Oral BID  . enoxaparin (LOVENOX) injection  40 mg Subcutaneous Q24H  . feeding supplement  237 mL Oral BID BM  . glipiZIDE  10 mg Oral BID AC  . insulin aspart  0-15 Units Subcutaneous TID WC  .  lisinopril  20 mg Oral Daily  . metoprolol tartrate  25 mg Oral BID  . multivitamin with minerals  1 tablet Oral Daily  . nutrition supplement  1 packet Oral BID BM  . pantoprazole  40 mg Oral Daily  . piperacillin-tazobactam (ZOSYN)  IV  3.375 g Intravenous Q8H  . polyethylene glycol  17 g Oral BID  . simvastatin  20 mg Oral q1800  . sodium chloride  3 mL Intravenous Q12H  . sodium phosphate  1 enema Rectal Once  . vancomycin  750 mg Intravenous Q12H   Continuous Infusions: . sodium chloride 50 mL/hr at 12/12/12 9604    Principal Problem:   Sepsis Active Problems:   Type II or unspecified type diabetes mellitus with unspecified complication, uncontrolled   ANEMIA, microcytic- Hgb down to 6.7 12/07/12- transfused   PVD, LSFA PTA 11/29/12   Non-healing ulcer of foot   HTN (hypertension), poor control   History of smoking. quit 2005   Sinus tachycardia   Skin ulcer of left lower leg   Osteomyelitis of ankle, left foot and ankle- MRI 12/07/12   Constipation    Time spent:44min    [facility]  [facility] Pager [phone]. If 7PM-7AM, please contact night-coverage at [email or web address], password [facility] 12/12/2012, 10:13 AM  LOS: 5 days

## 2012-12-12 NOTE — Care Management (Signed)
[facility] has accepted this pt . Mr Pola antibiodics will be arranged with [facility]. Awaiting script-will need  to be faxed to [facility] @877 [phone]. Phone # [phone].   Tish Frederickson will provide nursing services- I will be on vacation beginning 3/18- Ayesha Rumpf will be covering- [phone],  Thank You- Venia Minks [phone].
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
## 2012-12-12 NOTE — Progress Notes (Signed)
PT Cancellation Note  Patient Details Name: Keith Guzman MRN: [id number] DOB: [birth year]   Cancelled Treatment:    Reason Eval/Treat Not Completed: Medical issues which prohibited therapy;Pain limiting ability to participate   Rada Hay 12/12/2012, 3:29 PM [phone]

## 2012-12-12 NOTE — Progress Notes (Addendum)
INFECTIOUS DISEASE PROGRESS NOTE  ID: Keith Guzman is a 52 y.o. male with   Principal Problem:   Sepsis Active Problems:   Type II or unspecified type diabetes mellitus with unspecified complication, uncontrolled   ANEMIA, microcytic- Hgb down to 6.7 12/07/12- transfused   PVD, LSFA PTA 11/29/12   Non-healing ulcer of foot   HTN (hypertension), poor control   History of smoking. quit 2005   Sinus tachycardia   Skin ulcer of left lower leg   Osteomyelitis of ankle, left foot and ankle- MRI 12/07/12   Constipation  Subjective: Without complaints  Abtx:  Anti-infectives   Start     Dose/Rate Route Frequency Ordered Stop   12/12/12 0600  vancomycin (VANCOCIN) 750 mg in sodium chloride 0.9 % 150 mL IVPB     750 mg 150 mL/hr over 60 Minutes Intravenous Every 12 hours 12/11/12 2021     12/09/12 2000  vancomycin (VANCOCIN) 750 mg in sodium chloride 0.9 % 150 mL IVPB  Status:  Discontinued     750 mg 150 mL/hr over 60 Minutes Intravenous Every 8 hours 12/09/12 1252 12/11/12 2011   12/08/12 0000  vancomycin (VANCOCIN) 1,250 mg in sodium chloride 0.9 % 250 mL IVPB  Status:  Discontinued     1,250 mg 166.7 mL/hr over 90 Minutes Intravenous Every 12 hours 12/07/12 1027 12/09/12 1251   12/07/12 2000  piperacillin-tazobactam (ZOSYN) IVPB 3.375 g     3.375 g 12.5 mL/hr over 240 Minutes Intravenous 3 times per day 12/07/12 1027     12/07/12 1800  piperacillin-tazobactam (ZOSYN) IVPB 3.375 g  Status:  Discontinued     3.375 g 12.5 mL/hr over 240 Minutes Intravenous 3 times per day 12/07/12 1602 12/07/12 1615   12/07/12 1130  vancomycin (VANCOCIN) 1,250 mg in sodium chloride 0.9 % 250 mL IVPB     1,250 mg 166.7 mL/hr over 90 Minutes Intravenous  Once 12/07/12 1026 12/07/12 1354   12/07/12 1100  piperacillin-tazobactam (ZOSYN) IVPB 3.375 g     3.375 g 100 mL/hr over 30 Minutes Intravenous STAT 12/07/12 1026 12/07/12 1224      Medications:  Scheduled: . aspirin EC  162 mg Oral Daily  .  bisacodyl  10 mg Oral q12n4p  . collagenase  1 application Topical q morning - 10a  . docusate sodium  100 mg Oral BID  . enoxaparin (LOVENOX) injection  40 mg Subcutaneous Q24H  . feeding supplement  237 mL Oral BID BM  . glipiZIDE  10 mg Oral BID AC  . insulin aspart  0-15 Units Subcutaneous TID WC  . lisinopril  20 mg Oral Daily  . metoprolol tartrate  25 mg Oral BID  . multivitamin with minerals  1 tablet Oral Daily  . nutrition supplement  1 packet Oral BID BM  . pantoprazole  40 mg Oral Daily  . piperacillin-tazobactam (ZOSYN)  IV  3.375 g Intravenous Q8H  . polyethylene glycol  17 g Oral BID  . simvastatin  20 mg Oral q1800  . sodium chloride  3 mL Intravenous Q12H  . sodium phosphate  1 enema Rectal Once  . vancomycin  750 mg Intravenous Q12H    Objective: Vital signs in last 24 hours: Temp:  [98.5 F (36.9 C)-99.5 F (37.5 C)] 99.2 F (37.3 C) (03/17 0530) Pulse Rate:  [97-117] 97 (03/17 0530) Resp:  [16-20] 18 (03/17 0530) BP: (134-153)/(76-110) 147/77 mmHg (03/17 0530) SpO2:  [95 %-97 %] 97 % (03/17 0530)   General  appearance: alert, cooperative and no distress Extremities: L heel wound is clean. L great toe is purplish/discolored. the eliptical wound has purulent d/c that has a foul odor. pt believes that htis has gotten larger  Lab Results  Recent Labs  12/11/12 0416 12/12/12 0408  WBC 17.1* 16.3*  HGB 8.0* 8.6*  HCT 25.4* 26.7*  NA 133* 136  K 3.4* 3.2*  CL 96 97  CO2 30 28  BUN 14 11  CREATININE 1.21 1.17   Liver Panel No results found for this basename: PROT, ALBUMIN, AST, ALT, ALKPHOS, BILITOT, BILIDIR, IBILI,  in the last 72 hours Sedimentation Rate No results found for this basename: ESRSEDRATE,  in the last 72 hours C-Reactive Protein No results found for this basename: CRP,  in the last 72 hours  Microbiology: Recent Results (from the past 240 hour(s))  CULTURE, BLOOD (ROUTINE X 2)     Status: None   Collection Time    12/07/12 10:00  AM      Result Value Range Status   Specimen Description BLOOD LEFT FOREARM   Final   Special Requests BOTTLES DRAWN AEROBIC ONLY   Final   Culture  Setup Time 12/07/2012 13:45   Final   Culture     Final   Value:        BLOOD CULTURE RECEIVED NO GROWTH TO DATE CULTURE WILL BE HELD FOR 5 DAYS BEFORE ISSUING A FINAL NEGATIVE REPORT   Report Status PENDING   Incomplete  URINE CULTURE     Status: None   Collection Time    12/07/12 10:40 AM      Result Value Range Status   Specimen Description URINE, CLEAN CATCH   Final   Special Requests NONE   Final   Culture  Setup Time 12/07/2012 13:48   Final   Colony Count NO GROWTH   Final   Culture NO GROWTH   Final   Report Status 12/08/2012 FINAL   Final  CULTURE, BLOOD (ROUTINE X 2)     Status: None   Collection Time    12/07/12 10:50 AM      Result Value Range Status   Specimen Description BLOOD EFT FOREARM   Final   Special Requests BOTTLES DRAWN AEROBIC ONLY 5CC   Final   Culture  Setup Time 12/07/2012 13:45   Final   Culture     Final   Value:        BLOOD CULTURE RECEIVED NO GROWTH TO DATE CULTURE WILL BE HELD FOR 5 DAYS BEFORE ISSUING A FINAL NEGATIVE REPORT   Report Status PENDING   Incomplete  MRSA PCR SCREENING     Status: None   Collection Time    12/08/12  4:33 AM      Result Value Range Status   MRSA by PCR NEGATIVE  NEGATIVE Final   Comment:            The GeneXpert MRSA Assay (FDA     approved for NASAL specimens     only), is one component of a     comprehensive MRSA colonization     surveillance program. It is not     intended to diagnose MRSA     infection nor to guide or     monitor treatment for     MRSA infections.  WOUND CULTURE     Status: None   Collection Time    12/09/12  4:31 PM      Result Value Range Status  Specimen Description FOOT   Final   Special Requests Normal   Final   Gram Stain     Final   Value: NO WBC SEEN     RARE SQUAMOUS EPITHELIAL CELLS PRESENT     RARE GRAM POSITIVE COCCI IN  PAIRS   Culture MODERATE STENOTROPHOMONAS MALTOPHILIA   Final   Report Status 12/12/2012 FINAL   Final   Organism ID, Bacteria STENOTROPHOMONAS MALTOPHILIA   Final    Studies/Results: Dg Abd 1 View  12/11/2012  *RADIOLOGY REPORT*  Clinical Data: Abdominal pain.  Abdominal distention. Constipation.  ABDOMEN - 1 VIEW  Comparison: 12/07/2012  Findings: Gas pattern is normal without evidence of ileus, obstruction or apparent free air.  There is a normal amount of fecal matter in the colon.  No abnormal calcifications or acute bony findings.  IMPRESSION: Gas pattern and fecal burden within normal limits.   Original Report Authenticated By: Paulina Fusi, M.D.      Assessment/Plan: Poorly Controlled DM Diabetic Foot wound Anemia  I would NOT send this pt home with this wound.  I spoke with pt and his wife and strongly suggested that he needs amputation of this toe.  Dr Jomarie Longs to discuss with Dr Allyson Sabal.  Consider asking Dr Janee Morn to re-eval  Will start pt on bactrim for stenotrophomonas in his wound cx.  Total days of antibiotics: 6 (vanco/zosyn)        Keith Guzman Infectious Diseases [phone] 12/12/2012, 2:07 PM   LOS: 5 days

## 2012-12-12 NOTE — Progress Notes (Signed)
[facility]  Subjective: No LLE pain.  Objective: Vital signs in last 24 hours: Temp:  [98.5 F (36.9 C)-99.4 F (37.4 C)] 99.4 F (37.4 C) (03/17 1400) Pulse Rate:  [94-105] 94 (03/17 1400) Resp:  [18-20] 20 (03/17 1400) BP: (134-156)/(76-89) 156/89 mmHg (03/17 1400) SpO2:  [95 %-100 %] 100 % (03/17 1400) Last BM Date: 12/04/12  Intake/Output from previous day: 03/16 0701 - 03/17 0700 In: 2632.5 [P.O.:600; I.V.:1832.5; IV Piggyback:200] Out: 900 [Urine:900] Intake/Output this shift: Total I/O In: 443.8 [P.O.:240; I.V.:203.8] Out: 250 [Urine:250]  Medications Current Facility-Administered Medications  Medication Dose Route Frequency Provider Last Rate Last Dose  . 0.9 %  sodium chloride infusion   Intravenous Continuous Zannie Cove, MD 50 mL/hr at 12/12/12 2956    . acetaminophen (TYLENOL) tablet 650 mg  650 mg Oral Q6H PRN Nishant Dhungel, MD       Or  . acetaminophen (TYLENOL) suppository 650 mg  650 mg Rectal Q6H PRN Nishant Dhungel, MD      . aspirin EC tablet 162 mg  162 mg Oral Daily Nishant Dhungel, MD   162 mg at 12/12/12 1100  . bisacodyl (DULCOLAX) EC tablet 10 mg  10 mg Oral q12n4p Zannie Cove, MD   10 mg at 12/12/12 1236  . collagenase (SANTYL) ointment 1 application  1 application Topical q morning - 10a Zannie Cove, MD   1 application at 12/12/12 1100  . docusate sodium (COLACE) capsule 100 mg  100 mg Oral BID Nishant Dhungel, MD   100 mg at 12/12/12 1100  . enoxaparin (LOVENOX) injection 40 mg  40 mg Subcutaneous Q24H Nishant Dhungel, MD   40 mg at 12/11/12 1709  . feeding supplement (GLUCERNA SHAKE) liquid 237 mL  237 mL Oral BID BM Lorraine Lax, RD   237 mL at 12/12/12 1100  . glipiZIDE (GLUCOTROL) tablet 10 mg  10 mg Oral BID AC Zannie Cove, MD   10 mg at 12/12/12 0758  . HYDROcodone-acetaminophen (NORCO/VICODIN) 5-325 MG per tablet 1-2 tablet  1-2 tablet Oral Q4H PRN Eddie North, MD   2 tablet at 12/12/12  1148  . insulin aspart (novoLOG) injection 0-15 Units  0-15 Units Subcutaneous TID WC Nishant Dhungel, MD   2 Units at 12/12/12 1237  . lisinopril (PRINIVIL,ZESTRIL) tablet 20 mg  20 mg Oral Daily Nishant Dhungel, MD   20 mg at 12/12/12 1100  . metoprolol tartrate (LOPRESSOR) tablet 25 mg  25 mg Oral BID Nishant Dhungel, MD   25 mg at 12/12/12 1100  . multivitamin with minerals tablet 1 tablet  1 tablet Oral Daily Nishant Dhungel, MD   1 tablet at 12/12/12 1100  . nutrition supplement (JUVEN) powder packet 1 packet  1 packet Oral BID BM Lorraine Lax, RD   1 packet at 12/12/12 1100  . ondansetron (ZOFRAN) injection 4 mg  4 mg Intravenous Q6H PRN Nishant Dhungel, MD   4 mg at 12/12/12 0321  . pantoprazole (PROTONIX) EC tablet 40 mg  40 mg Oral Daily Zannie Cove, MD   40 mg at 12/12/12 1100  . polyethylene glycol (MIRALAX / GLYCOLAX) packet 17 g  17 g Oral BID Zannie Cove, MD   17 g at 12/12/12 1100  . simvastatin (ZOCOR) tablet 20 mg  20 mg Oral q1800 Nishant Dhungel, MD   20 mg at 12/11/12 1709  . sodium chloride 0.9 % injection 3 mL  3 mL Intravenous Q12H Nishant Dhungel, MD   3 mL  at 12/12/12 1000  . sodium phosphate (FLEET) 7-19 GM/118ML enema 1 enema  1 enema Rectal Once Zannie Cove, MD      . sulfamethoxazole-trimethoprim (BACTRIM) 320 mg in dextrose 5 % 500 mL IVPB  320 mg Intravenous Q12H Randall K Absher, RPH        PE: General appearance: alert, cooperative and no distress Lungs: clear to auscultation bilaterally Heart: regular rate and rhythm Extremities: no Avetisyan Pulses: 2+ radials, 2+ DP on the right. 2+ Popliteal pulse on the left. Skin: warm and dry Neurologic: Grossly normal  Lab Results:   Recent Labs  12/10/12 0530 12/11/12 0416 12/12/12 0408  WBC 24.2* 17.1* 16.3*  HGB 8.4* 8.0* 8.6*  HCT 26.4* 25.4* 26.7*  PLT 483* 479* 570*   BMET  Recent Labs  12/10/12 0530 12/11/12 0416 12/12/12 0408  NA 131* 133* 136  K 3.6 3.4* 3.2*  CL 94* 96 97  CO2 27  30 28   GLUCOSE 159* 194* 145*  BUN 14 14 11   CREATININE 1.24 1.21 1.17  CALCIUM 7.8* 7.9* 8.2*    Studies/Results: Wound Culture   Value   Specimen Description FOOT    Special Requests Normal    Gram Stain NO WBC SEEN RARE SQUAMOUS EPITHELIAL CELLS PRESENT RARE GRAM POSITIVE COCCI IN PAIRS   Culture MODERATE STENOTROPHOMONAS MALTOPHILIA   Report Status 12/12/2012 FINAL    Organism ID, Bacteria STENOTROPHOMONAS MALTOPHILIA    Resulting Agency [facility]    Assessment/Plan  Principal Problem:   Sepsis Active Problems:   Type II or unspecified type diabetes mellitus with unspecified complication, uncontrolled   ANEMIA, microcytic- Hgb down to 6.7 12/07/12- transfused   PVD, LSFA PTA 11/29/12   Non-healing ulcer of foot   HTN (hypertension), poor control   History of smoking. quit 2005   Sinus tachycardia   Skin ulcer of left lower leg   Osteomyelitis of ankle, left foot and ankle- MRI 12/07/12   Constipation  Plan:  Wound culture is positive for stenotrophomonas. He is now on IV Bactrim. He is afebrile. WBC is 16.3, down from 24.2 and 17.1. However, it appears that there has been progression of the necrotic ulcer, even on IV antibiotics.  Amputation of the great toe is perhaps the most appropriate treatment at this point. Dr. Allyson Sabal to evaluated later today.  He will provide recommendation.     LOS: 5 days    Brittainy M. Delmer Islam 12/12/2012 2:52 PM

## 2012-12-13 DIAGNOSIS — M869 Osteomyelitis, unspecified: Secondary | ICD-10-CM

## 2012-12-13 DIAGNOSIS — A419 Sepsis, unspecified organism: Secondary | ICD-10-CM

## 2012-12-13 LAB — CULTURE, BLOOD (ROUTINE X 2)

## 2012-12-13 LAB — CBC
HCT: 28.2 % — ABNORMAL LOW (ref 39.0–52.0)
MCHC: 31.2 g/dL (ref 30.0–36.0)
MCV: 75 fL — ABNORMAL LOW (ref 78.0–100.0)
Platelets: 565 10*3/uL — ABNORMAL HIGH (ref 150–400)
RDW: 14.9 % (ref 11.5–15.5)
WBC: 13.9 10*3/uL — ABNORMAL HIGH (ref 4.0–10.5)

## 2012-12-13 LAB — BASIC METABOLIC PANEL
BUN: 9 mg/dL (ref 6–23)
Chloride: 100 mEq/L (ref 96–112)
Creatinine, Ser: 1.11 mg/dL (ref 0.50–1.35)
GFR calc Af Amer: 86 mL/min — ABNORMAL LOW (ref >90)
GFR calc non Af Amer: 74 mL/min — ABNORMAL LOW (ref >90)

## 2012-12-13 LAB — GLUCOSE, CAPILLARY: Glucose-Capillary: 138 mg/dL — ABNORMAL HIGH (ref 70–99)

## 2012-12-13 MED ORDER — SODIUM CHLORIDE 0.9 % IJ SOLN
10.0000 mL | INTRAMUSCULAR | Status: DC | PRN
  Administered 2012-12-16 – 2012-12-18 (×3): 10 mL

## 2012-12-13 MED ORDER — AMLODIPINE BESYLATE 10 MG PO TABS
10.0000 mg | ORAL_TABLET | Freq: Every day | ORAL | Status: DC
  Administered 2012-12-13 – 2012-12-18 (×6): 10 mg via ORAL
  Filled 2012-12-13 (×7): qty 1
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
## 2012-12-13 NOTE — Progress Notes (Signed)
Patient refused BP medications last night BP now 180/95 and HR 102 now patient wants to take his BP medication none scheduled at this time next BP medication is scheduled at 1000. Paged physician on call with information situation. Spoke with K. Schorr advised to give patient 10am BP medication early at this time. Lurena Joiner, RN

## 2012-12-13 NOTE — Progress Notes (Signed)
Peripherally Inserted Central Catheter/Midline Placement  The IV Nurse has discussed with the patient and/or persons authorized to consent for the patient, the purpose of this procedure and the potential benefits and risks involved with this procedure.  The benefits include less needle sticks, lab draws from the catheter and patient may be discharged home with the catheter.  Risks include, but not limited to, infection, bleeding, blood clot (thrombus formation), and puncture of an artery; nerve damage and irregular heat beat.  Alternatives to this procedure were also discussed.  PICC/Midline Placement Documentation        Lisabeth Devoid 12/13/2012, 8:36 AM Consent obtained by Merleen Milliner, RN, CRNI yesterday.

## 2012-12-13 NOTE — Progress Notes (Signed)
Subjective:  Left great toe gangrenous and painful. Left heal ulcer improved  Objective:  Temp:  [98.2 F (36.8 C)-99.4 F (37.4 C)] 98.7 F (37.1 C) (03/18 1128) Pulse Rate:  [92-102] 98 (03/18 1128) Resp:  [20] 20 (03/18 1128) BP: (156-180)/(82-95) 170/85 mmHg (03/18 1128) SpO2:  [96 %-100 %] 98 % (03/18 1128) Weight change:   Intake/Output from previous day: 03/17 0701 - 03/18 0700 In: 3010.5 [P.O.:1320; I.V.:1170.5; IV Piggyback:520] Out: 2050 [Urine:2050]  Intake/Output from this shift: Total I/O In: -  Out: 200 [Urine:200]  Physical Exam: General appearance: alert, cooperative and mild distress Neck: no adenopathy, no carotid bruit, no JVD, supple, symmetrical, trachea midline and thyroid not enlarged, symmetric, no tenderness/mass/nodules Lungs: clear to auscultation bilaterally Heart: regular rate and rhythm, S1, S2 normal, no murmur, click, rub or gallop Extremities: extremities normal, atraumatic, no cyanosis or edema and Left great toe gangrenous with open non healing ulcer. Heal wound appears improved.  Lab Results: Results for orders placed during the hospital encounter of 12/07/12 (from the past 48 hour(s))  GLUCOSE, CAPILLARY     Status: Abnormal   Collection Time    12/11/12  4:34 PM      Result Value Range   Glucose-Capillary 143 (*) 70 - 99 mg/dL  VANCOMYCIN, TROUGH     Status: Abnormal   Collection Time    12/11/12  7:10 PM      Result Value Range   Vancomycin Tr 25.7 (*) 10.0 - 20.0 ug/mL   Comment: CRITICAL RESULT CALLED TO, READ BACK BY AND VERIFIED WITH:     LEMONS,A AT 2010 ON [id number] BY HOOKER,B  GLUCOSE, CAPILLARY     Status: Abnormal   Collection Time    12/11/12  9:17 PM      Result Value Range   Glucose-Capillary 151 (*) 70 - 99 mg/dL   Comment 1 Documented in Chart     Comment 2 Notify RN    CBC     Status: Abnormal   Collection Time    12/12/12  4:08 AM      Result Value Range   WBC 16.3 (*) 4.0 - 10.5 K/uL   RBC 3.56 (*) 4.22 -  5.81 MIL/uL   Hemoglobin 8.6 (*) 13.0 - 17.0 g/dL   HCT 91.4 (*) 78.2 - 95.6 %   MCV 75.0 (*) 78.0 - 100.0 fL   MCH 24.2 (*) 26.0 - 34.0 pg   MCHC 32.2  30.0 - 36.0 g/dL   RDW 21.3  08.6 - 57.8 %   Platelets 570 (*) 150 - 400 K/uL  BASIC METABOLIC PANEL     Status: Abnormal   Collection Time    12/12/12  4:08 AM      Result Value Range   Sodium 136  135 - 145 mEq/L   Potassium 3.2 (*) 3.5 - 5.1 mEq/L   Chloride 97  96 - 112 mEq/L   CO2 28  19 - 32 mEq/L   Glucose, Bld 145 (*) 70 - 99 mg/dL   BUN 11  6 - 23 mg/dL   Creatinine, Ser 4.69  0.50 - 1.35 mg/dL   Calcium 8.2 (*) 8.4 - 10.5 mg/dL   GFR calc non Af Amer 70 (*) >90 mL/min   GFR calc Af Amer 81 (*) >90 mL/min   Comment:            The eGFR has been calculated     using the CKD EPI equation.     This  calculation has not been     validated in all clinical     situations.     eGFR's persistently     <90 mL/min signify     possible Chronic Kidney Disease.  GLUCOSE, CAPILLARY     Status: Abnormal   Collection Time    12/12/12  7:29 AM      Result Value Range   Glucose-Capillary 166 (*) 70 - 99 mg/dL  GLUCOSE, CAPILLARY     Status: Abnormal   Collection Time    12/12/12 11:25 AM      Result Value Range   Glucose-Capillary 148 (*) 70 - 99 mg/dL  GLUCOSE, CAPILLARY     Status: Abnormal   Collection Time    12/12/12  3:51 PM      Result Value Range   Glucose-Capillary 115 (*) 70 - 99 mg/dL   Comment 1 Notify RN     Comment 2 Documented in Chart    GLUCOSE, CAPILLARY     Status: Abnormal   Collection Time    12/12/12  9:54 PM      Result Value Range   Glucose-Capillary 150 (*) 70 - 99 mg/dL  CBC     Status: Abnormal   Collection Time    12/13/12  4:15 AM      Result Value Range   WBC 13.9 (*) 4.0 - 10.5 K/uL   RBC 3.76 (*) 4.22 - 5.81 MIL/uL   Hemoglobin 8.8 (*) 13.0 - 17.0 g/dL   HCT 29.5 (*) 62.1 - 30.8 %   MCV 75.0 (*) 78.0 - 100.0 fL   MCH 23.4 (*) 26.0 - 34.0 pg   MCHC 31.2  30.0 - 36.0 g/dL   RDW 65.7   84.6 - 96.2 %   Platelets 565 (*) 150 - 400 K/uL  BASIC METABOLIC PANEL     Status: Abnormal   Collection Time    12/13/12  4:15 AM      Result Value Range   Sodium 137  135 - 145 mEq/L   Potassium 3.1 (*) 3.5 - 5.1 mEq/L   Chloride 100  96 - 112 mEq/L   CO2 28  19 - 32 mEq/L   Glucose, Bld 125 (*) 70 - 99 mg/dL   BUN 9  6 - 23 mg/dL   Creatinine, Ser 9.52  0.50 - 1.35 mg/dL   Calcium 8.1 (*) 8.4 - 10.5 mg/dL   GFR calc non Af Amer 74 (*) >90 mL/min   GFR calc Af Amer 86 (*) >90 mL/min   Comment:            The eGFR has been calculated     using the CKD EPI equation.     This calculation has not been     validated in all clinical     situations.     eGFR's persistently     <90 mL/min signify     possible Chronic Kidney Disease.  GLUCOSE, CAPILLARY     Status: Abnormal   Collection Time    12/13/12  7:33 AM      Result Value Range   Glucose-Capillary 150 (*) 70 - 99 mg/dL    Imaging: Imaging results have been reviewed  Assessment/Plan:   1. Principal Problem: 2.   Sepsis 3. Active Problems: 4.   Type II or unspecified type diabetes mellitus with unspecified complication, uncontrolled 5.   ANEMIA, microcytic- Hgb down to 6.7 12/07/12- transfused 6.   PVD, LSFA PTA 11/29/12 7.  Non-healing ulcer of foot 8.   HTN (hypertension), poor control 9.   History of smoking. quit 2005 10.   Sinus tachycardia 11.   Skin ulcer of left lower leg 12.   Osteomyelitis of ankle, left foot and ankle- MRI 12/07/12 13.   Constipation 14.   Time Spent Directly with Patient:  20 minutes  Length of Stay:  LOS: 6 days   I spoke with Dr. Jomarie Longs today. The heal ulcer appears improved however the Left greatr toe ischemic/infected ulcer has progressed despite systemic ATBX X 5 days. PICC line in place. ID seeing. Fever curve and WBCs better. I agree that pt has declared himself and will require amputation Left great toe. Dr. Lajoyce Corners consulted. We will follow along. The pts ABI has improved as a  result of percutaneous revascularization from .57 to .75.  Keith Guzman 12/13/2012, 12:31 PM

## 2012-12-13 NOTE — Progress Notes (Signed)
Patient refused all medication over night stated he had enough medication for the day. Patient did request pain medication once but did not want BP medication even though bp was elevated. Patient stated too much medication with no food is making his stomach upset. Lurena Joiner, RN

## 2012-12-13 NOTE — Progress Notes (Addendum)
[facility] PROGRESS NOTE  Keith Guzman YNW:[id number] DOB: 16-Nov-1960 DOA: 12/07/2012 PCP: Laurell Josephs, MD  Brief history: 52 year old male with history of uncontrolled type 2 diabetes mellitus, nonhealing ulcer on the left heel for which she follows at [facility], underwent  recent PTCA for limb salvage of critical ischemia of the left foot by Dr.Berry 3/4 presented to the wound care center for subjective fevers, chills, nausea and vomiting for past 2 days. Patient informs noticing an ulcer over the dorsum of the foot underneath the great toe 3 days back with foul smelling discharge. He also noted some bleeding from the chronic left heel wound.  Patient went to the wound care center and a packing was done around the new left dorsum ulcer as it was foul smelling as well. Patient was then sent to the ED. He was septic on admission and was started on broad spectrum abx and was seen by Dr.David Janee Morn, ORtho and Dr.Berry. Subsequently his wound has worsened despite abx and amputation of toe/TMT is being considered, Dr.Duda to see today    Assessment/Plan: Sepsis :  - Ischemic necrotic ulcer at base of great toe, + Osteomyelitis of calcaneous - completed 5 days of IV vancomycin and Zosyn, then transitioned to IV bactrim per ID - Blood cultures -NGTD, wound Cx :stenotrophomonas maltophilia - MRI without foot abscess but some lateral calcaneal tuberosity osteomyelitis - fever curve down, WBC finally starting to trend down - ID Dr.Hatcher following  L great toe: ischemia with necrotic ulcer: PVD, and possible osteomyelitis in lateral calcaneous tuberosity - LSFA PTA 11/29/12 per Dr.Berry, followed by 2 vessel run off - continue IV Abx as above,  - given progression of the necrotic ulcer on IV abx, it appears that amputation may be inevitable,  - i called and d/w Dr.Berry again, and called d/w Dr.Thompson Ortho today (3/18) who had seen the pt on admission,  - I discussed case with  D/w Dr.Duda, he will see pt in consult today - appreciate ID consult - PICC placed 3/17 for long term IV abx  Nausea and vomiting  -Possibly related to underlying sepsis. -improved, then worsened due to constipation , see below  Constipation: worsened by narcotics -  increase miralax to BID, continue dulcolax and senokot -  KUB unremarkable, given enema 3/17 without significant BM - continue clears for now, gradually advance as tolerated  Uncontrolled Type 2 diabetes mellitus  -Hemoglobin A1c close to 12,  2 months back, repeat HBaic 6.8 -Resumed glipizide, CBGs somewhat better -diabetic coordinator consulted  Iron deficiency anemia  -Anemia panel suggestive of iron deficiency.. No workup done in the past. -Baseline hemoglobin around 9., s/p 2 units PRBC 3/13 -Needs GI workup as outpatient, start Fe at DC   Hypertension  -Currently UP, Continue metoprolol, lisinopril add norvasc  Possible CAD:  -stress test per cards as outpatient  Code Status: Full  Family Communication: d/w pt at bedside Disposition Plan: home when improved   Consultants:   Dr.Duda, Ortho 3/18  Dr.Thompson, Ortho  Dr.Berry, [facility]  ID: Dr.Hatcher    Antibiotics:  Vanc 3/12  Zosyn 3/12  HPI/Subjective: Enema yesterday, no further BMs No nausea or vomiting, tolerating clears R toe ulcer painful  Objective: Filed Vitals:   12/12/12 1400 12/12/12 2200 12/13/12 0600 12/13/12 1050  BP: 156/89 163/91 180/95 156/82  Pulse: 94 92 102 95  Temp: 99.4 F (37.4 C) 98.7 F (37.1 C) 98.2 F (36.8 C)   TempSrc: Oral Oral Oral  Resp: 20 20 20    Height:      Weight:      SpO2: 100% 96% 96%     Intake/Output Summary (Last 24 hours) at 12/13/12 1104 Last data filed at 12/13/12 0953  Gross per 24 hour  Intake 2806.67 ml  Output   2000 ml  Net 806.67 ml   Filed Weights   12/07/12 1538 12/07/12 1713 12/08/12 0400  Weight: 72.576 kg (160 lb) 67.5 kg (148 lb 13 oz) 68.1 kg (150 lb 2.1 oz)     Exam: GEn: AAOx3, no distress HEENT: PERRLA, Mouth: no erythema or exudates, dry oral mucosa.  Cardiovascular: S1/S2/RRR, no m/r/g Pulmonary/Chest: CTAB, no wheezes, rales, or rhonchi  Abdominal: Soft. NT, distended, bowel sounds are normal, no masses, organomegaly, or guarding present.  GU: no CVA tenderness Musculoskeletal: No joint deformities, erythema, or stiffness, ROM full and no nontender Ext:  4x 3 cm ulcer over left heel, with scaly skin at margins .  Great toe is dark and cold,  With full thickness ulcer at base, extending to forefoot.  Data Reviewed: Basic Metabolic Panel:  Recent Labs Lab 12/09/12 0418 12/10/12 0530 12/11/12 0416 12/12/12 0408 12/13/12 0415  NA 133* 131* 133* 136 137  K 3.3* 3.6 3.4* 3.2* 3.1*  CL 96 94* 96 97 100  CO2 28 27 30 28 28   GLUCOSE 192* 159* 194* 145* 125*  BUN 8 14 14 11 9   CREATININE 0.87 1.24 1.21 1.17 1.11  CALCIUM 7.6* 7.8* 7.9* 8.2* 8.1*   Liver Function Tests:  Recent Labs Lab 12/07/12 1025  AST 16  ALT 14  ALKPHOS 83  BILITOT 0.7  PROT 7.7  ALBUMIN 2.6*    Recent Labs Lab 12/07/12 1025 12/09/12 1711  LIPASE 13 13  AMYLASE  --  30   No results found for this basename: AMMONIA,  in the last 168 hours CBC:  Recent Labs Lab 12/07/12 1050 12/08/12 0750 12/09/12 0418 12/10/12 0530 12/11/12 0416 12/12/12 0408 12/13/12 0415  WBC 17.6* 16.2* 21.7* 24.2* 17.1* 16.3* 13.9*  NEUTROABS 15.0* 13.3*  --   --   --   --   --   HGB 7.7* 6.7* 8.5* 8.4* 8.0* 8.6* 8.8*  HCT 23.9* 20.8* 26.1* 26.4* 25.4* 26.7* 28.2*  MCV 72.2* 72.7* 73.9* 74.6* 74.3* 75.0* 75.0*  PLT 456* 415* 445* 483* 479* 570* 565*   Cardiac Enzymes: No results found for this basename: CKTOTAL, CKMB, CKMBINDEX, TROPONINI,  in the last 168 hours BNP (last 3 results) No results found for this basename: PROBNP,  in the last 8760 hours CBG:  Recent Labs Lab 12/12/12 0729 12/12/12 1125 12/12/12 1551 12/12/12 2154 12/13/12 0733  GLUCAP  166* 148* 115* 150* 150*    Recent Results (from the past 240 hour(s))  CULTURE, BLOOD (ROUTINE X 2)     Status: None   Collection Time    12/07/12 10:00 AM      Result Value Range Status   Specimen Description BLOOD LEFT FOREARM   Final   Special Requests BOTTLES DRAWN AEROBIC ONLY   Final   Culture  Setup Time 12/07/2012 13:45   Final   Culture NO GROWTH 5 DAYS   Final   Report Status 12/13/2012 FINAL   Final  URINE CULTURE     Status: None   Collection Time    12/07/12 10:40 AM      Result Value Range Status   Specimen Description URINE, CLEAN CATCH   Final  Special Requests NONE   Final   Culture  Setup Time 12/07/2012 13:48   Final   Colony Count NO GROWTH   Final   Culture NO GROWTH   Final   Report Status 12/08/2012 FINAL   Final  CULTURE, BLOOD (ROUTINE X 2)     Status: None   Collection Time    12/07/12 10:50 AM      Result Value Range Status   Specimen Description BLOOD EFT FOREARM   Final   Special Requests BOTTLES DRAWN AEROBIC ONLY 5CC   Final   Culture  Setup Time 12/07/2012 13:45   Final   Culture NO GROWTH 5 DAYS   Final   Report Status 12/13/2012 FINAL   Final  MRSA PCR SCREENING     Status: None   Collection Time    12/08/12  4:33 AM      Result Value Range Status   MRSA by PCR NEGATIVE  NEGATIVE Final   Comment:            The GeneXpert MRSA Assay (FDA     approved for NASAL specimens     only), is one component of a     comprehensive MRSA colonization     surveillance program. It is not     intended to diagnose MRSA     infection nor to guide or     monitor treatment for     MRSA infections.  WOUND CULTURE     Status: None   Collection Time    12/09/12  4:31 PM      Result Value Range Status   Specimen Description FOOT   Final   Special Requests Normal   Final   Gram Stain     Final   Value: NO WBC SEEN     RARE SQUAMOUS EPITHELIAL CELLS PRESENT     RARE GRAM POSITIVE COCCI IN PAIRS   Culture MODERATE STENOTROPHOMONAS MALTOPHILIA    Final   Report Status 12/12/2012 FINAL   Final   Organism ID, Bacteria STENOTROPHOMONAS MALTOPHILIA   Final     Studies: No results found.  Scheduled Meds: . amLODipine  10 mg Oral Daily  . aspirin EC  162 mg Oral Daily  . bisacodyl  10 mg Oral q12n4p  . collagenase  1 application Topical q morning - 10a  . docusate sodium  100 mg Oral BID  . enoxaparin (LOVENOX) injection  40 mg Subcutaneous Q24H  . feeding supplement  237 mL Oral BID BM  . glipiZIDE  10 mg Oral BID AC  . insulin aspart  0-15 Units Subcutaneous TID WC  . lisinopril  20 mg Oral Daily  . metoprolol tartrate  25 mg Oral BID  . multivitamin with minerals  1 tablet Oral Daily  . nutrition supplement  1 packet Oral BID BM  . pantoprazole  40 mg Oral Daily  . polyethylene glycol  17 g Oral BID  . simvastatin  20 mg Oral q1800  . sodium chloride  3 mL Intravenous Q12H  . sodium phosphate  1 enema Rectal Once  . sulfamethoxazole-trimethoprim  320 mg Intravenous Q12H   Continuous Infusions: . sodium chloride 50 mL/hr at 12/13/12 1610    Principal Problem:   Sepsis Active Problems:   Type II or unspecified type diabetes mellitus with unspecified complication, uncontrolled   ANEMIA, microcytic- Hgb down to 6.7 12/07/12- transfused   PVD, LSFA PTA 11/29/12   Non-healing ulcer of foot   HTN (hypertension), poor  control   History of smoking. quit 2005   Sinus tachycardia   Skin ulcer of left lower leg   Osteomyelitis of ankle, left foot and ankle- MRI 12/07/12   Constipation    Time spent:32min    [facility]  [facility] Pager [phone]. If 7PM-7AM, please contact night-coverage at [email or web address], password [facility] 12/13/2012, 11:04 AM  LOS: 6 days

## 2012-12-13 NOTE — Progress Notes (Signed)
INFECTIOUS DISEASE PROGRESS NOTE  ID: Keith Guzman is a 52 y.o. male with   Principal Problem:   Sepsis Active Problems:   Type II or unspecified type diabetes mellitus with unspecified complication, uncontrolled   ANEMIA, microcytic- Hgb down to 6.7 12/07/12- transfused   PVD, LSFA PTA 11/29/12   Non-healing ulcer of foot   HTN (hypertension), poor control   History of smoking. quit 2005   Sinus tachycardia   Skin ulcer of left lower leg   Osteomyelitis of ankle, left foot and ankle- MRI 12/07/12   Constipation  Subjective: Concerned about losing his foot but has accepted that he may need surgery.  Describes multiple layers of skin coming off with dressing change.  Denies sx from his HTN States his FSG has been high  Abtx:  Anti-infectives   Start     Dose/Rate Route Frequency Ordered Stop   12/12/12 1600  sulfamethoxazole-trimethoprim (BACTRIM) 320 mg in dextrose 5 % 500 mL IVPB     320 mg 346.7 mL/hr over 90 Minutes Intravenous Every 12 hours 12/12/12 1434     12/12/12 0600  vancomycin (VANCOCIN) 750 mg in sodium chloride 0.9 % 150 mL IVPB  Status:  Discontinued     750 mg 150 mL/hr over 60 Minutes Intravenous Every 12 hours 12/11/12 2021 12/12/12 1413   12/09/12 2000  vancomycin (VANCOCIN) 750 mg in sodium chloride 0.9 % 150 mL IVPB  Status:  Discontinued     750 mg 150 mL/hr over 60 Minutes Intravenous Every 8 hours 12/09/12 1252 12/11/12 2011   12/08/12 0000  vancomycin (VANCOCIN) 1,250 mg in sodium chloride 0.9 % 250 mL IVPB  Status:  Discontinued     1,250 mg 166.7 mL/hr over 90 Minutes Intravenous Every 12 hours 12/07/12 1027 12/09/12 1251   12/07/12 2000  piperacillin-tazobactam (ZOSYN) IVPB 3.375 g  Status:  Discontinued     3.375 g 12.5 mL/hr over 240 Minutes Intravenous 3 times per day 12/07/12 1027 12/12/12 1413   12/07/12 1800  piperacillin-tazobactam (ZOSYN) IVPB 3.375 g  Status:  Discontinued     3.375 g 12.5 mL/hr over 240 Minutes Intravenous 3 times per  day 12/07/12 1602 12/07/12 1615   12/07/12 1130  vancomycin (VANCOCIN) 1,250 mg in sodium chloride 0.9 % 250 mL IVPB     1,250 mg 166.7 mL/hr over 90 Minutes Intravenous  Once 12/07/12 1026 12/07/12 1354   12/07/12 1100  piperacillin-tazobactam (ZOSYN) IVPB 3.375 g     3.375 g 100 mL/hr over 30 Minutes Intravenous STAT 12/07/12 1026 12/07/12 1224      Medications:  Scheduled: . amLODipine  10 mg Oral Daily  . aspirin EC  162 mg Oral Daily  . bisacodyl  10 mg Oral q12n4p  . collagenase  1 application Topical q morning - 10a  . docusate sodium  100 mg Oral BID  . enoxaparin (LOVENOX) injection  40 mg Subcutaneous Q24H  . feeding supplement  237 mL Oral BID BM  . glipiZIDE  10 mg Oral BID AC  . insulin aspart  0-15 Units Subcutaneous TID WC  . lisinopril  20 mg Oral Daily  . metoprolol tartrate  25 mg Oral BID  . multivitamin with minerals  1 tablet Oral Daily  . nutrition supplement  1 packet Oral BID BM  . pantoprazole  40 mg Oral Daily  . polyethylene glycol  17 g Oral BID  . simvastatin  20 mg Oral q1800  . sodium chloride  3 mL Intravenous  Q12H  . sodium phosphate  1 enema Rectal Once  . sulfamethoxazole-trimethoprim  320 mg Intravenous Q12H    Objective: Vital signs in last 24 hours: Temp:  [98.2 F (36.8 C)-98.7 F (37.1 C)] 98.7 F (37.1 C) (03/18 1128) Pulse Rate:  [92-102] 98 (03/18 1128) Resp:  [20] 20 (03/18 1128) BP: (156-180)/(82-95) 170/85 mmHg (03/18 1128) SpO2:  [96 %-98 %] 98 % (03/18 1128)   General appearance: alert, cooperative and no distress Resp: clear to auscultation bilaterally Cardio: regular rate and rhythm GI: normal findings: bowel sounds normal and soft, non-tender Extremities: LLE wrapped  Lab Results  Recent Labs  12/12/12 0408 12/13/12 0415  WBC 16.3* 13.9*  HGB 8.6* 8.8*  HCT 26.7* 28.2*  NA 136 137  K 3.2* 3.1*  CL 97 100  CO2 28 28  BUN 11 9  CREATININE 1.17 1.11   Liver Panel No results found for this basename:  PROT, ALBUMIN, AST, ALT, ALKPHOS, BILITOT, BILIDIR, IBILI,  in the last 72 hours Sedimentation Rate No results found for this basename: ESRSEDRATE,  in the last 72 hours C-Reactive Protein No results found for this basename: CRP,  in the last 72 hours  Microbiology: Recent Results (from the past 240 hour(s))  CULTURE, BLOOD (ROUTINE X 2)     Status: None   Collection Time    12/07/12 10:00 AM      Result Value Range Status   Specimen Description BLOOD LEFT FOREARM   Final   Special Requests BOTTLES DRAWN AEROBIC ONLY   Final   Culture  Setup Time 12/07/2012 13:45   Final   Culture NO GROWTH 5 DAYS   Final   Report Status 12/13/2012 FINAL   Final  URINE CULTURE     Status: None   Collection Time    12/07/12 10:40 AM      Result Value Range Status   Specimen Description URINE, CLEAN CATCH   Final   Special Requests NONE   Final   Culture  Setup Time 12/07/2012 13:48   Final   Colony Count NO GROWTH   Final   Culture NO GROWTH   Final   Report Status 12/08/2012 FINAL   Final  CULTURE, BLOOD (ROUTINE X 2)     Status: None   Collection Time    12/07/12 10:50 AM      Result Value Range Status   Specimen Description BLOOD EFT FOREARM   Final   Special Requests BOTTLES DRAWN AEROBIC ONLY 5CC   Final   Culture  Setup Time 12/07/2012 13:45   Final   Culture NO GROWTH 5 DAYS   Final   Report Status 12/13/2012 FINAL   Final  MRSA PCR SCREENING     Status: None   Collection Time    12/08/12  4:33 AM      Result Value Range Status   MRSA by PCR NEGATIVE  NEGATIVE Final   Comment:            The GeneXpert MRSA Assay (FDA     approved for NASAL specimens     only), is one component of a     comprehensive MRSA colonization     surveillance program. It is not     intended to diagnose MRSA     infection nor to guide or     monitor treatment for     MRSA infections.  WOUND CULTURE     Status: None   Collection Time  12/09/12  4:31 PM      Result Value Range Status   Specimen  Description FOOT   Final   Special Requests Normal   Final   Gram Stain     Final   Value: NO WBC SEEN     RARE SQUAMOUS EPITHELIAL CELLS PRESENT     RARE GRAM POSITIVE COCCI IN PAIRS   Culture MODERATE STENOTROPHOMONAS MALTOPHILIA   Final   Report Status 12/12/2012 FINAL   Final   Organism ID, Bacteria STENOTROPHOMONAS MALTOPHILIA   Final    Studies/Results: No results found.   Assessment/Plan: Poorly Controlled DM  Diabetic Foot wound  Anemia HTN  No change in his anbx for now.  His recent revascularization will hopefully help with wound healing.  WBC slightly better.  Will continue to watch, ortho to re-eval.  Total days of antibiotics: 7 (bactrim)         [name] Infectious Diseases [phone] 12/13/2012, 4:09 PM   LOS: 6 days

## 2012-12-13 NOTE — Progress Notes (Signed)
PT Cancellation Note  Patient Details Name: Keith Guzman MRN: [id number] DOB: 06/05/1961   Cancelled Treatment:    Reason Eval/Treat Not Completed: Other (comment) Pt wished to speak with MD this morning prior to therapy and upon checking back to room this afternoon pt was sleeping and then just received food try.  Will attempt to check on pt tomorrow.   Tyreisha Ungar,KATHrine E 12/13/2012, 3:33 PM

## 2012-12-14 ENCOUNTER — Encounter (HOSPITAL_COMMUNITY): Payer: Self-pay | Admitting: *Deleted

## 2012-12-14 ENCOUNTER — Encounter (HOSPITAL_COMMUNITY): Payer: Self-pay | Admitting: Registered Nurse

## 2012-12-14 ENCOUNTER — Inpatient Hospital Stay (HOSPITAL_COMMUNITY): Payer: BC Managed Care – PPO | Admitting: *Deleted

## 2012-12-14 ENCOUNTER — Encounter (HOSPITAL_COMMUNITY)
Admission: EM | Disposition: A | Discharge status: Home Health | Payer: Self-pay | Source: Home / Self Care | Attending: Internal Medicine

## 2012-12-14 HISTORY — PX: AMPUTATION: SHX166

## 2012-12-14 LAB — GLUCOSE, CAPILLARY
Glucose-Capillary: 118 mg/dL — ABNORMAL HIGH (ref 70–99)
Glucose-Capillary: 227 mg/dL — ABNORMAL HIGH (ref 70–99)
Glucose-Capillary: 187 mg/dL — ABNORMAL HIGH (ref 70–99)

## 2012-12-14 LAB — BASIC METABOLIC PANEL
CO2: 27 mEq/L (ref 19–32)
Calcium: 8.4 mg/dL (ref 8.4–10.5)
Chloride: 100 mEq/L (ref 96–112)
Potassium: 3.3 mEq/L — ABNORMAL LOW (ref 3.5–5.1)
Sodium: 136 mEq/L (ref 135–145)

## 2012-12-14 LAB — PROTIME-INR: INR: 1.15 (ref 0.00–1.49)

## 2012-12-14 SURGERY — AMPUTATION DIGIT
Anesthesia: General | Site: Foot | Laterality: Left | Wound class: Dirty or Infected

## 2012-12-14 SURGERY — AMPUTATION, FOOT, PARTIAL
Anesthesia: General | Site: Foot | Laterality: Left

## 2012-12-14 MED ORDER — FENTANYL CITRATE 0.05 MG/ML IJ SOLN: 25.0000 ug | INTRAMUSCULAR | Status: DC | PRN

## 2012-12-14 MED ORDER — LACTATED RINGERS IV SOLN
INTRAVENOUS | Status: DC | PRN
  Administered 2012-12-14: 18:00:00 via INTRAVENOUS

## 2012-12-14 MED ORDER — 0.9 % SODIUM CHLORIDE (POUR BTL) OPTIME
TOPICAL | Status: DC | PRN
  Administered 2012-12-14: 1000 mL

## 2012-12-14 MED ORDER — PROMETHAZINE HCL 25 MG/ML IJ SOLN: 6.2500 mg | INTRAMUSCULAR | Status: DC | PRN

## 2012-12-14 MED ORDER — WARFARIN VIDEO: Freq: Once | Status: DC

## 2012-12-14 MED ORDER — WARFARIN - PHARMACIST DOSING INPATIENT: Freq: Every day | Status: DC

## 2012-12-14 MED ORDER — METOCLOPRAMIDE HCL 10 MG PO TABS: 5.0000 mg | ORAL_TABLET | Freq: Three times a day (TID) | ORAL | Status: DC | PRN

## 2012-12-14 MED ORDER — FERROUS SULFATE 325 (65 FE) MG PO TABS
325.0000 mg | ORAL_TABLET | Freq: Two times a day (BID) | ORAL | Status: DC
  Administered 2012-12-15 – 2012-12-18 (×6): 325 mg via ORAL
  Filled 2012-12-14 (×10): qty 1

## 2012-12-14 MED ORDER — LIDOCAINE HCL (CARDIAC) 10 MG/ML IV SOLN
INTRAVENOUS | Status: DC | PRN
  Administered 2012-12-14: 50 mg via INTRAVENOUS

## 2012-12-14 MED ORDER — HYDROMORPHONE HCL PF 1 MG/ML IJ SOLN
0.5000 mg | INTRAMUSCULAR | Status: DC | PRN
  Administered 2012-12-14 – 2012-12-18 (×12): 1 mg via INTRAVENOUS
  Filled 2012-12-14 (×13): qty 1

## 2012-12-14 MED ORDER — ONDANSETRON HCL 4 MG/2ML IJ SOLN
INTRAMUSCULAR | Status: DC | PRN
  Administered 2012-12-14: 4 mg via INTRAVENOUS

## 2012-12-14 MED ORDER — FENTANYL CITRATE 0.05 MG/ML IJ SOLN
INTRAMUSCULAR | Status: DC | PRN
  Administered 2012-12-14 (×2): 50 ug via INTRAVENOUS

## 2012-12-14 MED ORDER — POTASSIUM CHLORIDE CRYS ER 20 MEQ PO TBCR
40.0000 meq | EXTENDED_RELEASE_TABLET | Freq: Once | ORAL | Status: AC
  Administered 2012-12-14: 40 meq via ORAL
  Filled 2012-12-14: qty 2

## 2012-12-14 MED ORDER — OXYCODONE-ACETAMINOPHEN 5-325 MG PO TABS
1.0000 | ORAL_TABLET | ORAL | Status: DC | PRN
Start: 2012-12-14 — End: 2012-12-18

## 2012-12-14 MED ORDER — ACETAMINOPHEN 10 MG/ML IV SOLN
INTRAVENOUS | Status: DC | PRN
  Administered 2012-12-14: 1000 mg via INTRAVENOUS

## 2012-12-14 MED ORDER — PATIENT'S GUIDE TO USING COUMADIN BOOK
Freq: Once | Status: DC
  Filled 2012-12-14: qty 1

## 2012-12-14 MED ORDER — WARFARIN SODIUM 10 MG PO TABS
10.0000 mg | ORAL_TABLET | Freq: Once | ORAL | Status: AC
  Administered 2012-12-14: 10 mg via ORAL
  Filled 2012-12-14: qty 1

## 2012-12-14 MED ORDER — HYDROCODONE-ACETAMINOPHEN 5-325 MG PO TABS: 1.0000 | ORAL_TABLET | ORAL | Status: DC | PRN

## 2012-12-14 MED ORDER — ONDANSETRON HCL 4 MG/2ML IJ SOLN: 4.0000 mg | Freq: Four times a day (QID) | INTRAMUSCULAR | Status: DC | PRN

## 2012-12-14 MED ORDER — ONDANSETRON HCL 4 MG PO TABS
4.0000 mg | ORAL_TABLET | Freq: Four times a day (QID) | ORAL | Status: DC | PRN
  Administered 2012-12-18: 4 mg via ORAL
  Filled 2012-12-14: qty 1

## 2012-12-14 MED ORDER — MIDAZOLAM HCL 5 MG/5ML IJ SOLN
INTRAMUSCULAR | Status: DC | PRN
  Administered 2012-12-14: 1 mg via INTRAVENOUS
  Administered 2012-12-14: 2 mg via INTRAVENOUS

## 2012-12-14 MED ORDER — LACTATED RINGERS IV SOLN: INTRAVENOUS | Status: DC

## 2012-12-14 MED ORDER — METOCLOPRAMIDE HCL 5 MG/ML IJ SOLN: 5.0000 mg | Freq: Three times a day (TID) | INTRAMUSCULAR | Status: DC | PRN

## 2012-12-14 MED ORDER — METOPROLOL TARTRATE 50 MG PO TABS
50.0000 mg | ORAL_TABLET | Freq: Two times a day (BID) | ORAL | Status: DC
  Administered 2012-12-14 – 2012-12-18 (×8): 50 mg via ORAL
  Filled 2012-12-14 (×10): qty 1

## 2012-12-14 MED ORDER — PROPOFOL 10 MG/ML IV BOLUS
INTRAVENOUS | Status: DC | PRN
  Administered 2012-12-14: 150 mg via INTRAVENOUS

## 2012-12-14 MED ORDER — SODIUM CHLORIDE 0.9 % IV SOLN
125.0000 mg | Freq: Once | INTRAVENOUS | Status: AC
  Administered 2012-12-14: 125 mg via INTRAVENOUS
  Filled 2012-12-14: qty 10

## 2012-12-14 SURGICAL SUPPLY — 40 items
BAG SPEC THK2 15X12 ZIP CLS (MISCELLANEOUS) ×1
BAG ZIPLOCK 12X15 (MISCELLANEOUS) ×2 IMPLANT
BANDAGE GAUZE ELAST BULKY 4 IN (GAUZE/BANDAGES/DRESSINGS) ×2 IMPLANT
BLADE SAW SGTL 81X20 HD (BLADE) ×1 IMPLANT
BNDG COHESIVE 4X5 TAN STRL (GAUZE/BANDAGES/DRESSINGS) ×1 IMPLANT
CLOTH BEACON ORANGE TIMEOUT ST (SAFETY) ×2 IMPLANT
COVER SURGICAL LIGHT HANDLE (MISCELLANEOUS) ×2 IMPLANT
CUFF TOURN SGL QUICK 34 (TOURNIQUET CUFF) ×2
CUFF TRNQT CYL 34X4X40X1 (TOURNIQUET CUFF) ×1 IMPLANT
DECANTER SPIKE VIAL GLASS SM (MISCELLANEOUS) ×2 IMPLANT
DRAPE C-ARM 42X72 X-RAY (DRAPES) ×1 IMPLANT
DRAPE U-SHAPE 47X51 STRL (DRAPES) ×2 IMPLANT
DRSG ADAPTIC 3X8 NADH LF (GAUZE/BANDAGES/DRESSINGS) ×2 IMPLANT
DRSG EMULSION OIL 3X16 NADH (GAUZE/BANDAGES/DRESSINGS) ×1 IMPLANT
DRSG PAD ABDOMINAL 8X10 ST (GAUZE/BANDAGES/DRESSINGS) ×2 IMPLANT
ELECT REM PT RETURN 9FT ADLT (ELECTROSURGICAL) ×2
ELECTRODE REM PT RTRN 9FT ADLT (ELECTROSURGICAL) ×1 IMPLANT
GLOVE BIOGEL PI IND STRL 6 (GLOVE) IMPLANT
GLOVE BIOGEL PI IND STRL 8.5 (GLOVE) ×1 IMPLANT
GLOVE BIOGEL PI INDICATOR 6 (GLOVE) ×1
GLOVE BIOGEL PI INDICATOR 8.5 (GLOVE) ×1
GLOVE ECLIPSE 9.0 STRL (GLOVE) ×2 IMPLANT
GLOVE SURG ORTHO 9.0 STRL STRW (GLOVE) ×2 IMPLANT
GLOVE SURG SS PI 6.5 STRL IVOR (GLOVE) ×1 IMPLANT
GOWN SRG XL XLNG 56XLVL 4 (GOWN DISPOSABLE) IMPLANT
GOWN STRL NON-REIN XL XLG LVL4 (GOWN DISPOSABLE) ×4
MANIFOLD NEPTUNE II (INSTRUMENTS) ×2 IMPLANT
NEEDLE HYPO 22GX1.5 SAFETY (NEEDLE) ×1 IMPLANT
NS IRRIG 1000ML POUR BTL (IV SOLUTION) ×1 IMPLANT
PACK LOWER EXTREMITY WL (CUSTOM PROCEDURE TRAY) ×2 IMPLANT
PAD CAST 4YDX4 CTTN HI CHSV (CAST SUPPLIES) ×2 IMPLANT
PADDING CAST COTTON 4X4 STRL (CAST SUPPLIES) ×4
POSITIONER SURGICAL ARM (MISCELLANEOUS) ×2 IMPLANT
SPONGE GAUZE 4X4 12PLY (GAUZE/BANDAGES/DRESSINGS) ×2 IMPLANT
SPONGE LAP 18X18 X RAY DECT (DISPOSABLE) ×1 IMPLANT
SUT ETHILON 2 0 PSLX (SUTURE) ×2 IMPLANT
SUT ETHILON 4 0 PS 2 18 (SUTURE) ×2 IMPLANT
SUT VIC AB 2-0 CT1 27 (SUTURE) ×2
SUT VIC AB 2-0 CT1 TAPERPNT 27 (SUTURE) ×1 IMPLANT
SYR CONTROL 10ML LL (SYRINGE) ×1 IMPLANT

## 2012-12-14 NOTE — Progress Notes (Signed)
This RN entered patient's room to take 10MG  PO Coumadin and discuss rationale for medication as well as precautions, laboratory tests associated, etc. Patient initially refused to participate in the education and refused the medication stating, "I told you I didn't want to take more medicine tonight." This RN went over the importance of the medication to which the patient voiced understanding. The patient's guide to using coumadin book was left on the bedside table as directed by the patient. Patient refused to view Coumadin video at this time. Patient did agree to think about watching Coumadin video tomorrow.

## 2012-12-14 NOTE — Progress Notes (Signed)
Patient arrived to unit @ 1915 via bed from PACU. Patient alert and oriented x4. Wife notified of arrival to unit. Orders released and verified. Patient informed of medications to be given to which patient stated, "I'm not going to be taking all of these medicines tonight. I already told them it makes me sick." This RN informed patient there was an antiemetic available in the event that nausea/vomiting occurs. Patient stated, "I will take my blood pressure medicine but that's it. Don't come back in here with anything else." Patient did agree to IV medications. No other issues at this time. Will continue to monitor. Wife now at bedside.

## 2012-12-14 NOTE — Transfer of Care (Signed)
Immediate Anesthesia Transfer of Care Note  Patient: Keith Guzman  Procedure(s) Performed: Procedure(s) with comments: AMPUTATION Left Mid Foot (Left) - Left Midfoot Amputation  Patient Location: PACU  Anesthesia Type:General  Level of Consciousness: awake, alert , oriented and patient cooperative  Airway & Oxygen Therapy: Patient Spontanous Breathing and Patient connected to face mask oxygen  Post-op Assessment: Report given to PACU RN and Post -op Vital signs reviewed and stable  Post vital signs: stable  Complications: No apparent anesthesia complications

## 2012-12-14 NOTE — Anesthesia Preprocedure Evaluation (Signed)
Anesthesia Evaluation  Patient identified by MRN, date of birth, ID band Patient awake    Reviewed: Allergy & Precautions, H&P , NPO status , Patient's Chart, lab work & pertinent test results  Airway Mallampati: II TM Distance: >3 FB Neck ROM: Full    Dental no notable dental hx.    Pulmonary Current Smoker,  breath sounds clear to auscultation  Pulmonary exam normal       Cardiovascular hypertension, + Peripheral Vascular Disease Rhythm:Regular Rate:Normal     Neuro/Psych negative neurological ROS  negative psych ROS   GI/Hepatic negative GI ROS, Neg liver ROS,   Endo/Other  diabetes, Poorly Controlled  Renal/GU negative Renal ROS  negative genitourinary   Musculoskeletal negative musculoskeletal ROS (+)   Abdominal   Peds negative pediatric ROS (+)  Hematology  (+) Blood dyscrasia, anemia ,   Anesthesia Other Findings   Reproductive/Obstetrics negative OB ROS                           Anesthesia Physical Anesthesia Plan  ASA: III  Anesthesia Plan: General   Post-op Pain Management:    Induction: Intravenous  Airway Management Planned: LMA  Additional Equipment:   Intra-op Plan:   Post-operative Plan:   Informed Consent: I have reviewed the patients History and Physical, chart, labs and discussed the procedure including the risks, benefits and alternatives for the proposed anesthesia with the patient or authorized representative who has indicated his/her understanding and acceptance.   Dental advisory given  Plan Discussed with: CRNA and Surgeon  Anesthesia Plan Comments:         Anesthesia Quick Evaluation

## 2012-12-14 NOTE — Progress Notes (Signed)
Agree with the NP/PA-C note as written.  Will see patient after surgery - agree with optimizing b-blocker dose.  Chrystie Nose, MD, [facility] Attending Cardiologist [facility]

## 2012-12-14 NOTE — Progress Notes (Signed)
INFECTIOUS DISEASE PROGRESS NOTE  ID: Keith Guzman is a 52 y.o. male with   Principal Problem:   Sepsis, gangrene Lt great toe Active Problems:   Type II or unspecified type diabetes mellitus with unspecified complication, uncontrolled   ANEMIA, microcytic- Hgb down to 6.7 12/07/12- transfused   PVD, LSFA PTA 11/29/12   Non-healing ulcer of foot   HTN (hypertension), poor control   History of smoking. quit 2005   Sinus tachycardia   Skin ulcer of left lower leg   Osteomyelitis of ankle, left foot and ankle- MRI 12/07/12   Constipation  Subjective: Resting quietly, without complaints upon awakening.  Abtx:  Anti-infectives   Start     Dose/Rate Route Frequency Ordered Stop   12/12/12 1600  sulfamethoxazole-trimethoprim (BACTRIM) 320 mg in dextrose 5 % 500 mL IVPB     320 mg 346.7 mL/hr over 90 Minutes Intravenous Every 12 hours 12/12/12 1434     12/12/12 0600  vancomycin (VANCOCIN) 750 mg in sodium chloride 0.9 % 150 mL IVPB  Status:  Discontinued     750 mg 150 mL/hr over 60 Minutes Intravenous Every 12 hours 12/11/12 2021 12/12/12 1413   12/09/12 2000  vancomycin (VANCOCIN) 750 mg in sodium chloride 0.9 % 150 mL IVPB  Status:  Discontinued     750 mg 150 mL/hr over 60 Minutes Intravenous Every 8 hours 12/09/12 1252 12/11/12 2011   12/08/12 0000  vancomycin (VANCOCIN) 1,250 mg in sodium chloride 0.9 % 250 mL IVPB  Status:  Discontinued     1,250 mg 166.7 mL/hr over 90 Minutes Intravenous Every 12 hours 12/07/12 1027 12/09/12 1251   12/07/12 2000  piperacillin-tazobactam (ZOSYN) IVPB 3.375 g  Status:  Discontinued     3.375 g 12.5 mL/hr over 240 Minutes Intravenous 3 times per day 12/07/12 1027 12/12/12 1413   12/07/12 1800  piperacillin-tazobactam (ZOSYN) IVPB 3.375 g  Status:  Discontinued     3.375 g 12.5 mL/hr over 240 Minutes Intravenous 3 times per day 12/07/12 1602 12/07/12 1615   12/07/12 1130  vancomycin (VANCOCIN) 1,250 mg in sodium chloride 0.9 % 250 mL IVPB     1,250 mg 166.7 mL/hr over 90 Minutes Intravenous  Once 12/07/12 1026 12/07/12 1354   12/07/12 1100  piperacillin-tazobactam (ZOSYN) IVPB 3.375 g     3.375 g 100 mL/hr over 30 Minutes Intravenous STAT 12/07/12 1026 12/07/12 1224      Medications:  Scheduled: . amLODipine  10 mg Oral Daily  . aspirin EC  162 mg Oral Daily  . bisacodyl  10 mg Oral q12n4p  . collagenase  1 application Topical q morning - 10a  . docusate sodium  100 mg Oral BID  . enoxaparin (LOVENOX) injection  40 mg Subcutaneous Q24H  . feeding supplement  237 mL Oral BID BM  . ferric gluconate (FERRLECIT/NULECIT) IV  125 mg Intravenous Once  . ferrous sulfate  325 mg Oral BID WC  . insulin aspart  0-15 Units Subcutaneous TID WC  . lisinopril  20 mg Oral Daily  . metoprolol tartrate  50 mg Oral BID  . multivitamin with minerals  1 tablet Oral Daily  . nutrition supplement  1 packet Oral BID BM  . pantoprazole  40 mg Oral Daily  . polyethylene glycol  17 g Oral BID  . simvastatin  20 mg Oral q1800  . sodium chloride  3 mL Intravenous Q12H  . sodium phosphate  1 enema Rectal Once  . sulfamethoxazole-trimethoprim  320  mg Intravenous Q12H    Objective: Vital signs in last 24 hours: Temp:  [99 F (37.2 C)-99.8 F (37.7 C)] 99.1 F (37.3 C) (03/19 0914) Pulse Rate:  [96-112] 112 (03/19 0914) Resp:  [16-17] 17 (03/19 0914) BP: (162-172)/(91-92) 172/91 mmHg (03/19 0914) SpO2:  [97 %-99 %] 99 % (03/19 0647)   General appearance: no distress Resp: clear to auscultation bilaterally Cardio: regular rate and rhythm GI: normal findings: bowel sounds normal and soft, non-tender Incision/Wound: dressed RUE PIC  Lab Results  Recent Labs  12/12/12 0408 12/13/12 0415 12/14/12 1155  WBC 16.3* 13.9*  --   HGB 8.6* 8.8*  --   HCT 26.7* 28.2*  --   NA 136 137 136  K 3.2* 3.1* 3.3*  CL 97 100 100  CO2 28 28 27   BUN 11 9 6   CREATININE 1.17 1.11 1.04   Liver Panel No results found for this basename: PROT,  ALBUMIN, AST, ALT, ALKPHOS, BILITOT, BILIDIR, IBILI,  in the last 72 hours Sedimentation Rate No results found for this basename: ESRSEDRATE,  in the last 72 hours C-Reactive Protein No results found for this basename: CRP,  in the last 72 hours  Microbiology: Recent Results (from the past 240 hour(s))  CULTURE, BLOOD (ROUTINE X 2)     Status: None   Collection Time    12/07/12 10:00 AM      Result Value Range Status   Specimen Description BLOOD LEFT FOREARM   Final   Special Requests BOTTLES DRAWN AEROBIC ONLY   Final   Culture  Setup Time 12/07/2012 13:45   Final   Culture NO GROWTH 5 DAYS   Final   Report Status 12/13/2012 FINAL   Final  URINE CULTURE     Status: None   Collection Time    12/07/12 10:40 AM      Result Value Range Status   Specimen Description URINE, CLEAN CATCH   Final   Special Requests NONE   Final   Culture  Setup Time 12/07/2012 13:48   Final   Colony Count NO GROWTH   Final   Culture NO GROWTH   Final   Report Status 12/08/2012 FINAL   Final  CULTURE, BLOOD (ROUTINE X 2)     Status: None   Collection Time    12/07/12 10:50 AM      Result Value Range Status   Specimen Description BLOOD EFT FOREARM   Final   Special Requests BOTTLES DRAWN AEROBIC ONLY 5CC   Final   Culture  Setup Time 12/07/2012 13:45   Final   Culture NO GROWTH 5 DAYS   Final   Report Status 12/13/2012 FINAL   Final  MRSA PCR SCREENING     Status: None   Collection Time    12/08/12  4:33 AM      Result Value Range Status   MRSA by PCR NEGATIVE  NEGATIVE Final   Comment:            The GeneXpert MRSA Assay (FDA     approved for NASAL specimens     only), is one component of a     comprehensive MRSA colonization     surveillance program. It is not     intended to diagnose MRSA     infection nor to guide or     monitor treatment for     MRSA infections.  WOUND CULTURE     Status: None   Collection Time  12/09/12  4:31 PM      Result Value Range Status   Specimen  Description FOOT   Final   Special Requests Normal   Final   Gram Stain     Final   Value: NO WBC SEEN     RARE SQUAMOUS EPITHELIAL CELLS PRESENT     RARE GRAM POSITIVE COCCI IN PAIRS   Culture MODERATE STENOTROPHOMONAS MALTOPHILIA   Final   Report Status 12/12/2012 FINAL   Final   Organism ID, Bacteria STENOTROPHOMONAS MALTOPHILIA   Final    Studies/Results: No results found.   Assessment/Plan: Poorly Controlled DM  Diabetic Foot wound  Anemia  HTN   No change in his anbx for now.  His recent revascularization will hopefully help with wound healing.  Will continue to watch,  To OR soon Total days of antibiotics: 8 (bactrim)   [name] Infectious Diseases [phone] 12/14/2012, 2:09 PM   LOS: 7 days

## 2012-12-14 NOTE — Progress Notes (Signed)
PT Note Spoke briefly with pt who stated he is supposed to have surgery later today. Will hold PT and follow up after surgery. Thanks. Rebeca Alert, PT [phone]

## 2012-12-14 NOTE — Progress Notes (Signed)
[facility] PROGRESS NOTE  Keith Guzman AVW:[id number] DOB: 1961/07/04 DOA: 12/07/2012 PCP: Laurell Josephs, MD  Assessment/Plan: Sepsis :  - Ischemic necrotic ulcer at base of great toe, + Osteomyelitis of calcaneous  - completed 5 days of IV vancomycin and Zosyn, then transitioned to IV bactrim per ID  -Continue TMP/SMZ IV (12/12/12>>) - Blood cultures -NGTD, wound Cx :stenotrophomonas maltophilia  - MRI without foot abscess but some lateral calcaneal tuberosity osteomyelitis  - fever curve down, WBC trend down  - ID Dr.Hatcher following  L great toe: ischemia with necrotic ulcer: PVD, and possible osteomyelitis in lateral calcaneous tuberosity  - LSFA PTA 11/29/12 per Dr.Berry, followed by 2 vessel run off  - continue IV Abx - given progression of the necrotic ulcer on IV abx, it appears that amputation may be inevitable,  - d/w Dr.Berry again, and called d/w Dr.Thompson Ortho today (3/18) who had seen the pt on admission,  - Appreciate Dr. Shawna Clamp noted for surgery-TMA - appreciate ID followup - PICC placed 3/17 for long term IV abx  Nausea and vomiting  -Possibly related to underlying sepsis and infectious process superimposed upon suspected diabetic gastroparesis -improved, then worsened due to constipation , see below  Constipation -partly due to opioids - increase miralax to BID, continue dulcolax and senokot  - KUB unremarkable, given enema 3/17 without significant BM  - continue clears for now, gradually advance as tolerated  Uncontrolled Type 2 diabetes mellitus  -Hemoglobin A1C  6.8  -Discontinue glipizide due to the patient's renal insufficiency and intermittent n.p.o. status for procedures Iron deficiency anemia  -Anemia panel suggestive of iron deficiency.. No workup done in the past.  -Baseline hemoglobin around 9., s/p 2 units PRBC 3/13  -Needs GI workup as outpatient -Give a dose of ferric gluconate -start ferrous sulfate  Hypertension  -Continue  amlodipine, metoprolol tartrate, and lisinopril Possible CAD:  -stress test per cards as outpatient    Family Communication: d/w pt at bedside  Disposition Plan: home when improved  Consultants:  Dr.Duda, Ortho 3/18  Dr.Thompson, Ortho  Dr.Berry, [facility]  ID: Dr.Hatcher  Antibiotics:  Vanc 3/12-3/17  Zosyn 3/12-3/17 TMP/SMZ IV  12/12/12>>>          Procedures/Studies: Dg Chest 2 View  12/07/2012  *RADIOLOGY REPORT*  Clinical Data: Vomiting, nausea and abdominal pain.  CHEST - 2 VIEW  Comparison: 11/16/2012.  Findings: Trachea is midline.  Heart size is accentuated by somewhat low lung volumes and semi upright technique.  Probable scarring in the left suprahilar region, superimposed with osseous bridging between the left first and second anterior ribs.  Lungs are otherwise clear.  No pleural fluid.  IMPRESSION: No acute findings.   Original Report Authenticated By: Leanna Battles, M.D.    Dg Chest 2 View  11/16/2012  *RADIOLOGY REPORT*  Clinical Data: Pre hyperbaric oxygen therapy.  Hypertension.  Ex- smoker.  CHEST - 2 VIEW  Comparison: 03/09/2006  Findings: Post-traumatic  changes about the left hemithorax including the distal clavicle and anterior 1st/2nd ribs.  These are chronic. Midline trachea.  Normal heart size and mediastinal contours. No pleural effusion or pneumothorax.  Clear lungs.  IMPRESSION: No acute cardiopulmonary disease.   Original Report Authenticated By: Jeronimo Greaves, M.D.    Dg Abd 1 View  12/11/2012  *RADIOLOGY REPORT*  Clinical Data: Abdominal pain.  Abdominal distention. Constipation.  ABDOMEN - 1 VIEW  Comparison: 12/07/2012  Findings: Gas pattern is normal without evidence of ileus, obstruction or apparent free air.  There  is a normal amount of fecal matter in the colon.  No abnormal calcifications or acute bony findings.  IMPRESSION: Gas pattern and fecal burden within normal limits.   Original Report Authenticated By: Paulina Fusi, M.D.    Dg Abd 1  View  12/07/2012  *RADIOLOGY REPORT*  Clinical Data: Mid abdominal pain.  Constipation.  ABDOMEN - 1 VIEW  Comparison: None.  Findings: No free intraperitoneal air is identified.  Bowel gas pattern is normal.  No abnormal abdominal calcification or focal bony abnormality.  There is some right hip degenerative disease.  IMPRESSION: No acute finding.   Original Report Authenticated By: Holley Dexter, M.D.    Mr Foot Left W Wo Contrast  12/07/2012  *RADIOLOGY REPORT*  Clinical Data: Osteomyelitis.  Fever.  Sepsis.  Diabetic foot. Foot ulceration.  MRI OF THE LEFT FOREFOOT WITHOUT AND WITH CONTRAST,MRI OF THE LEFT ANKLE WITHOUT AND WITH CONTRAST  Technique:  Multiplanar, multisequence MR imaging was performed both before and after administration of intravenous contrast.  Contrast: 15mL MULTIHANCE GADOBENATE DIMEGLUMINE 529 MG/ML IV SOLN  Comparison: None.  Findings: Motion artifact is present on the examination.  Diabetic myositis is present in the plantar foot musculature.  Diffuse subcutaneous edema and enhancement compatible with cellulitis.  There is no soft tissue abscess identified.  There is a dorsal forefoot ulceration overlying the proximal phalanx of the great toe however this is not extend to the bony surface.  There is a blister along the lateral aspect of the great toe.  No soft tissue abscess is present.  Small effusion of the first MTP joint.  Marginal erosions are present in the first MTP, suggesting gout arthritis over pressure erosion.  Correlation with serum uric acid is recommended.  IMPRESSION: Negative for osteomyelitis of the forefoot.  Dorsal forefoot ulceration overlying the proximal phalanx of the great toe and blistering along the lateral aspect of the great toe.  No soft tissue abscess.  MRI LEFT ANKLE WITH AND WITHOUT CONTRAST  Technique:  Multiplanar, multisequence MR imaging of the left ankle was performed before and after the administration of intravenous contrast.  Contrast: 15mL  MULTIHANCE GADOBENATE DIMEGLUMINE 529 MG/ML IV SOLN  Comparison:  10/16/2012.  Findings: Diffuse subcutaneous edema is present which may represent dependent edema or cellulitis in the appropriate clinical setting.  Diabetic myopathy is present with some fatty atrophy of the plantar foot musculature.  There is a new ulcer along the lateral aspects of the left calcaneal tuberosity with phlegmon tracking to the cortical surface and osteomyelitis of the lateral calcaneal tuberosity (image number 20 series 17).  These marrow changes are new compared to the prior exam of 10/16/2012 and the ulcer appears new as well.  Midfoot appears within normal limits without evidence of erosions.  The flexor and extensor tendons appear within normal limits.  Talus appears normal.  Medial and lateral malleolus and the tibial plafond are within normal limits.  Small ankle and subtalar effusions are likely reactive.  IMPRESSION: New small area of osteomyelitis of the lateral calcaneal tuberosity with posterior lateral heel ulcer tracking to the focus of osteomyelitis.  No abscess.   Original Report Authenticated By: Andreas Newport, M.D.    Mr Ankle Left W Wo Contrast  12/07/2012  *RADIOLOGY REPORT*  Clinical Data: Osteomyelitis.  Fever.  Sepsis.  Diabetic foot. Foot ulceration.  MRI OF THE LEFT FOREFOOT WITHOUT AND WITH CONTRAST,MRI OF THE LEFT ANKLE WITHOUT AND WITH CONTRAST  Technique:  Multiplanar, multisequence MR imaging was performed both before  and after administration of intravenous contrast.  Contrast: 15mL MULTIHANCE GADOBENATE DIMEGLUMINE 529 MG/ML IV SOLN  Comparison: None.  Findings: Motion artifact is present on the examination.  Diabetic myositis is present in the plantar foot musculature.  Diffuse subcutaneous edema and enhancement compatible with cellulitis.  There is no soft tissue abscess identified.  There is a dorsal forefoot ulceration overlying the proximal phalanx of the great toe however this is not extend to  the bony surface.  There is a blister along the lateral aspect of the great toe.  No soft tissue abscess is present.  Small effusion of the first MTP joint.  Marginal erosions are present in the first MTP, suggesting gout arthritis over pressure erosion.  Correlation with serum uric acid is recommended.  IMPRESSION: Negative for osteomyelitis of the forefoot.  Dorsal forefoot ulceration overlying the proximal phalanx of the great toe and blistering along the lateral aspect of the great toe.  No soft tissue abscess.  MRI LEFT ANKLE WITH AND WITHOUT CONTRAST  Technique:  Multiplanar, multisequence MR imaging of the left ankle was performed before and after the administration of intravenous contrast.  Contrast: 15mL MULTIHANCE GADOBENATE DIMEGLUMINE 529 MG/ML IV SOLN  Comparison:  10/16/2012.  Findings: Diffuse subcutaneous edema is present which may represent dependent edema or cellulitis in the appropriate clinical setting.  Diabetic myopathy is present with some fatty atrophy of the plantar foot musculature.  There is a new ulcer along the lateral aspects of the left calcaneal tuberosity with phlegmon tracking to the cortical surface and osteomyelitis of the lateral calcaneal tuberosity (image number 20 series 17).  These marrow changes are new compared to the prior exam of 10/16/2012 and the ulcer appears new as well.  Midfoot appears within normal limits without evidence of erosions.  The flexor and extensor tendons appear within normal limits.  Talus appears normal.  Medial and lateral malleolus and the tibial plafond are within normal limits.  Small ankle and subtalar effusions are likely reactive.  IMPRESSION: New small area of osteomyelitis of the lateral calcaneal tuberosity with posterior lateral heel ulcer tracking to the focus of osteomyelitis.  No abscess.   Original Report Authenticated By: Andreas Newport, M.D.          Subjective: Patient continues to complain of nausea with one episode of  emesis. Denies hematemesis. Denies fevers, chills, chest pain, shortness breath, dysuria, hematuria, rashes. Has epigastric discomfort.  Objective: Filed Vitals:   12/13/12 1128 12/13/12 2146 12/14/12 0647 12/14/12 0914  BP: 170/85 162/92 167/92 172/91  Pulse: 98 110 96 112  Temp: 98.7 F (37.1 C) 99.8 F (37.7 C) 99 F (37.2 C) 99.1 F (37.3 C)  TempSrc: Oral Oral Oral Oral  Resp: 20 16 16 17   Height:      Weight:      SpO2: 98% 97% 99%     Intake/Output Summary (Last 24 hours) at 12/14/12 1132 Last data filed at 12/14/12 0648  Gross per 24 hour  Intake   1640 ml  Output   1025 ml  Net    615 ml   Weight change:  Exam:   General:  Pt is alert, follows commands appropriately, not in acute distress  HEENT: No icterus, No thrush, No neck mass, [place]/AT  Cardiovascular: RRR, S1/S2, no rubs, no gallops  Respiratory: CTA bilaterally, no wheezing, no crackles, no rhonchi  Abdomen: Soft/+BS, mildly tender to palpation epigastric, non distended, no guarding  Extremities: trace edema, No lymphangitis, No petechiae, No rashes, no  synovitis; gangrene of left hallux; dorsal aspect of the left hallux with purulent drainage from ulceration; left lateral calcaneus with superficial ulceration without drainage, erythema, crepitance  Data Reviewed: Basic Metabolic Panel:  Recent Labs Lab 12/09/12 0418 12/10/12 0530 12/11/12 0416 12/12/12 0408 12/13/12 0415  NA 133* 131* 133* 136 137  K 3.3* 3.6 3.4* 3.2* 3.1*  CL 96 94* 96 97 100  CO2 28 27 30 28 28   GLUCOSE 192* 159* 194* 145* 125*  BUN 8 14 14 11 9   CREATININE 0.87 1.24 1.21 1.17 1.11  CALCIUM 7.6* 7.8* 7.9* 8.2* 8.1*   Liver Function Tests: No results found for this basename: AST, ALT, ALKPHOS, BILITOT, PROT, ALBUMIN,  in the last 168 hours  Recent Labs Lab 12/09/12 1711  LIPASE 13  AMYLASE 30   No results found for this basename: AMMONIA,  in the last 168 hours CBC:  Recent Labs Lab 12/08/12 0750  12/09/12 0418 12/10/12 0530 12/11/12 0416 12/12/12 0408 12/13/12 0415  WBC 16.2* 21.7* 24.2* 17.1* 16.3* 13.9*  NEUTROABS 13.3*  --   --   --   --   --   HGB 6.7* 8.5* 8.4* 8.0* 8.6* 8.8*  HCT 20.8* 26.1* 26.4* 25.4* 26.7* 28.2*  MCV 72.7* 73.9* 74.6* 74.3* 75.0* 75.0*  PLT 415* 445* 483* 479* 570* 565*   Cardiac Enzymes: No results found for this basename: CKTOTAL, CKMB, CKMBINDEX, TROPONINI,  in the last 168 hours BNP: No components found with this basename: POCBNP,  CBG:  Recent Labs Lab 12/13/12 0733 12/13/12 1513 12/13/12 1724 12/13/12 2144 12/14/12 0724  GLUCAP 150* 190* 138* 115* 154*    Recent Results (from the past 240 hour(s))  CULTURE, BLOOD (ROUTINE X 2)     Status: None   Collection Time    12/07/12 10:00 AM      Result Value Range Status   Specimen Description BLOOD LEFT FOREARM   Final   Special Requests BOTTLES DRAWN AEROBIC ONLY   Final   Culture  Setup Time 12/07/2012 13:45   Final   Culture NO GROWTH 5 DAYS   Final   Report Status 12/13/2012 FINAL   Final  URINE CULTURE     Status: None   Collection Time    12/07/12 10:40 AM      Result Value Range Status   Specimen Description URINE, CLEAN CATCH   Final   Special Requests NONE   Final   Culture  Setup Time 12/07/2012 13:48   Final   Colony Count NO GROWTH   Final   Culture NO GROWTH   Final   Report Status 12/08/2012 FINAL   Final  CULTURE, BLOOD (ROUTINE X 2)     Status: None   Collection Time    12/07/12 10:50 AM      Result Value Range Status   Specimen Description BLOOD EFT FOREARM   Final   Special Requests BOTTLES DRAWN AEROBIC ONLY 5CC   Final   Culture  Setup Time 12/07/2012 13:45   Final   Culture NO GROWTH 5 DAYS   Final   Report Status 12/13/2012 FINAL   Final  MRSA PCR SCREENING     Status: None   Collection Time    12/08/12  4:33 AM      Result Value Range Status   MRSA by PCR NEGATIVE  NEGATIVE Final   Comment:            The GeneXpert MRSA Assay (FDA  approved  for NASAL specimens     only), is one component of a     comprehensive MRSA colonization     surveillance program. It is not     intended to diagnose MRSA     infection nor to guide or     monitor treatment for     MRSA infections.  WOUND CULTURE     Status: None   Collection Time    12/09/12  4:31 PM      Result Value Range Status   Specimen Description FOOT   Final   Special Requests Normal   Final   Gram Stain     Final   Value: NO WBC SEEN     RARE SQUAMOUS EPITHELIAL CELLS PRESENT     RARE GRAM POSITIVE COCCI IN PAIRS   Culture MODERATE STENOTROPHOMONAS MALTOPHILIA   Final   Report Status 12/12/2012 FINAL   Final   Organism ID, Bacteria STENOTROPHOMONAS MALTOPHILIA   Final     Scheduled Meds: . amLODipine  10 mg Oral Daily  . aspirin EC  162 mg Oral Daily  . bisacodyl  10 mg Oral q12n4p  . collagenase  1 application Topical q morning - 10a  . docusate sodium  100 mg Oral BID  . enoxaparin (LOVENOX) injection  40 mg Subcutaneous Q24H  . feeding supplement  237 mL Oral BID BM  . insulin aspart  0-15 Units Subcutaneous TID WC  . lisinopril  20 mg Oral Daily  . metoprolol tartrate  50 mg Oral BID  . multivitamin with minerals  1 tablet Oral Daily  . nutrition supplement  1 packet Oral BID BM  . pantoprazole  40 mg Oral Daily  . polyethylene glycol  17 g Oral BID  . simvastatin  20 mg Oral q1800  . sodium chloride  3 mL Intravenous Q12H  . sodium phosphate  1 enema Rectal Once  . sulfamethoxazole-trimethoprim  320 mg Intravenous Q12H   Continuous Infusions: . sodium chloride 1,000 mL (12/13/12 2041)     Itzae Miralles, DO  [facility] Pager [phone]  If 7PM-7AM, please contact night-coverage [email or web address] Password [facility] 12/14/2012, 11:32 AM   LOS: 7 days

## 2012-12-14 NOTE — Op Note (Signed)
OPERATIVE REPORT  DATE OF SURGERY: 12/14/2012  PATIENT:  Keith Guzman,  52 y.o. male  PRE-OPERATIVE DIAGNOSIS:  Gangrene Left Foot  POST-OPERATIVE DIAGNOSIS:  gangrene left mid foot  PROCEDURE:  Procedure(s): AMPUTATION Left Mid Foot  SURGEON:  Surgeon(s): Nadara Mustard, MD  ANESTHESIA:   general  EBL:  min ML  SPECIMEN:  Source of Specimen:  Left forefoot  TOURNIQUET:  * No tourniquets in log *  PROCEDURE DETAILS: Patient is a 52 year old gentleman with peripheral vascular disease diabetes who presents with ischemic gangrenous changes of the forefoot with a large abscess. Patient is status post revascularization developed a thrombolic event and presents at this time for a midfoot amputation for foot salvage surgery. Risks and benefits were discussed including nonhealing of the incision potential for transtibial amputation. Patient states he understands and wishes to proceed at this time. Description of procedure patient was brought to the operating room and underwent a general anesthetic. After adequate levels of anesthesia were obtained patient's left lower extremity was prepped using DuraPrep draped into a sterile field an Ioban was used to cover the gangrenous forefoot with abscess. A fishmouth incision was made through the midfoot this was carried possibly and ~was no abscess encountered. A large posterior flap was created and a midfoot amputation was performed. A reciprocating saw was used. Hemostasis was obtained with electrocautery the wounds irrigated with normal saline there is no purulence no necrotic tissue. The incision was closed using 2-0 nylon. The wound is covered with Adaptic orthopedic sponges AB dressing Kerlix and Coban. Patient was extubated taken to the PACU in stable condition.  PLAN OF CARE: Admit to inpatient   PATIENT DISPOSITION:  PACU - hemodynamically stable.   Nadara Mustard, MD 12/14/2012 6:25 PM

## 2012-12-14 NOTE — Progress Notes (Signed)
Pt refused all medications for 2200. Pt refused lopressor, pt stated he already took a dose for the day. RN explained to pt that the MD order two times per day. Pt's bp elevated. Pt educated on importance of taking the lopressor. Pt stated he would take both doses as ordered starting on 3/19.
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
## 2012-12-14 NOTE — Progress Notes (Signed)
Subjective:  No chest pain or SOB. Possible surgery late today.  Objective:  Vital Signs in the last 24 hours: Temp:  [98.7 F (37.1 C)-99.8 F (37.7 C)] 99 F (37.2 C) (03/19 0647) Pulse Rate:  [95-110] 96 (03/19 0647) Resp:  [16-20] 16 (03/19 0647) BP: (156-170)/(82-92) 167/92 mmHg (03/19 0647) SpO2:  [97 %-99 %] 99 % (03/19 0647)  Intake/Output from previous day:  Intake/Output Summary (Last 24 hours) at 12/14/12 0853 Last data filed at 12/14/12 0648  Gross per 24 hour  Intake   1640 ml  Output   1225 ml  Net    415 ml    Physical Exam: General appearance: alert, cooperative and no distress Lungs: clear to auscultation bilaterally Heart: regular rate and rhythm   Rate: 96  Rhythm: normal sinus rhythm and sinus tachycardia  Lab Results:  Recent Labs  12/12/12 0408 12/13/12 0415  WBC 16.3* 13.9*  HGB 8.6* 8.8*  PLT 570* 565*    Recent Labs  12/12/12 0408 12/13/12 0415  NA 136 137  K 3.2* 3.1*  CL 97 100  CO2 28 28  GLUCOSE 145* 125*  BUN 11 9  CREATININE 1.17 1.11   No results found for this basename: TROPONINI, CK, MB,  in the last 72 hours Hepatic Function Panel No results found for this basename: PROT, ALBUMIN, AST, ALT, ALKPHOS, BILITOT, BILIDIR, IBILI,  in the last 72 hours No results found for this basename: CHOL,  in the last 72 hours No results found for this basename: INR,  in the last 72 hours  Imaging: Imaging results have been reviewed  Cardiac Studies:  Assessment/Plan:   Principal Problem:   Sepsis, gangrene Lt great toe Active Problems:   PVD, LSFA PTA 11/29/12   Type II or unspecified type diabetes mellitus with unspecified complication, uncontrolled   ANEMIA, microcytic- Hgb down to 6.7 12/07/12- transfused   Non-healing ulcer of foot   HTN (hypertension), poor control   Skin ulcer of left lower leg   Osteomyelitis of ankle, left foot and ankle- MRI 12/07/12   History of smoking. quit 2005   Sinus tachycardia    Constipation   Plan- Increase Metoprolol to 50mg  BID. We will follow peri-op. Replace K+.   Corine Shelter PA-C 12/14/2012, 8:53 AM

## 2012-12-15 ENCOUNTER — Encounter (HOSPITAL_COMMUNITY): Payer: Self-pay | Admitting: Orthopedic Surgery

## 2012-12-15 LAB — BASIC METABOLIC PANEL
Calcium: 8.1 mg/dL — ABNORMAL LOW (ref 8.4–10.5)
Chloride: 101 mEq/L (ref 96–112)
GFR calc non Af Amer: 76 mL/min — ABNORMAL LOW (ref >90)
Potassium: 3.4 mEq/L — ABNORMAL LOW (ref 3.5–5.1)
Sodium: 136 mEq/L (ref 135–145)

## 2012-12-15 LAB — PROTIME-INR: Prothrombin Time: 14.2 seconds (ref 11.6–15.2)

## 2012-12-15 LAB — CBC
Hemoglobin: 7.1 g/dL — ABNORMAL LOW (ref 13.0–17.0)
MCH: 23.7 pg — ABNORMAL LOW (ref 26.0–34.0)
MCHC: 31.7 g/dL (ref 30.0–36.0)
MCV: 74.9 fL — ABNORMAL LOW (ref 78.0–100.0)

## 2012-12-15 LAB — GLUCOSE, CAPILLARY: Glucose-Capillary: 165 mg/dL — ABNORMAL HIGH (ref 70–99)

## 2012-12-15 MED ORDER — POTASSIUM CHLORIDE 10 MEQ/100ML IV SOLN
10.0000 meq | INTRAVENOUS | Status: AC
  Administered 2012-12-15 (×2): 10 meq via INTRAVENOUS
  Filled 2012-12-15 (×2): qty 100

## 2012-12-15 NOTE — Progress Notes (Addendum)
INFECTIOUS DISEASE PROGRESS NOTE  ID: Keith Guzman is a 52 y.o. male with  Principal Problem:   Sepsis, gangrene Lt great toe Active Problems:   Type II or unspecified type diabetes mellitus with unspecified complication, uncontrolled   ANEMIA, microcytic- Hgb down to 6.7 12/07/12- transfused   PVD, LSFA PTA 11/29/12   Non-healing ulcer of foot   HTN (hypertension), poor control   History of smoking. quit 2005   Sinus tachycardia   Skin ulcer of left lower leg   Osteomyelitis of ankle, left foot and ankle- MRI 12/07/12   Constipation  Subjective: Depressed affect. No complaints, no problems with rash.  Abtx:  Anti-infectives   Start     Dose/Rate Route Frequency Ordered Stop   12/12/12 1600  sulfamethoxazole-trimethoprim (BACTRIM) 320 mg in dextrose 5 % 500 mL IVPB     320 mg 346.7 mL/hr over 90 Minutes Intravenous Every 12 hours 12/12/12 1434     12/12/12 0600  vancomycin (VANCOCIN) 750 mg in sodium chloride 0.9 % 150 mL IVPB  Status:  Discontinued     750 mg 150 mL/hr over 60 Minutes Intravenous Every 12 hours 12/11/12 2021 12/12/12 1413   12/09/12 2000  vancomycin (VANCOCIN) 750 mg in sodium chloride 0.9 % 150 mL IVPB  Status:  Discontinued     750 mg 150 mL/hr over 60 Minutes Intravenous Every 8 hours 12/09/12 1252 12/11/12 2011   12/08/12 0000  vancomycin (VANCOCIN) 1,250 mg in sodium chloride 0.9 % 250 mL IVPB  Status:  Discontinued     1,250 mg 166.7 mL/hr over 90 Minutes Intravenous Every 12 hours 12/07/12 1027 12/09/12 1251   12/07/12 2000  piperacillin-tazobactam (ZOSYN) IVPB 3.375 g  Status:  Discontinued     3.375 g 12.5 mL/hr over 240 Minutes Intravenous 3 times per day 12/07/12 1027 12/12/12 1413   12/07/12 1800  piperacillin-tazobactam (ZOSYN) IVPB 3.375 g  Status:  Discontinued     3.375 g 12.5 mL/hr over 240 Minutes Intravenous 3 times per day 12/07/12 1602 12/07/12 1615   12/07/12 1130  vancomycin (VANCOCIN) 1,250 mg in sodium chloride 0.9 % 250 mL IVPB     1,250 mg 166.7 mL/hr over 90 Minutes Intravenous  Once 12/07/12 1026 12/07/12 1354   12/07/12 1100  piperacillin-tazobactam (ZOSYN) IVPB 3.375 g     3.375 g 100 mL/hr over 30 Minutes Intravenous STAT 12/07/12 1026 12/07/12 1224      Medications:  Scheduled: . amLODipine  10 mg Oral Daily  . aspirin EC  162 mg Oral Daily  . bisacodyl  10 mg Oral q12n4p  . collagenase  1 application Topical q morning - 10a  . docusate sodium  100 mg Oral BID  . enoxaparin (LOVENOX) injection  40 mg Subcutaneous Q24H  . feeding supplement  237 mL Oral BID BM  . ferrous sulfate  325 mg Oral BID WC  . insulin aspart  0-15 Units Subcutaneous TID WC  . lisinopril  20 mg Oral Daily  . metoprolol tartrate  50 mg Oral BID  . multivitamin with minerals  1 tablet Oral Daily  . nutrition supplement  1 packet Oral BID BM  . pantoprazole  40 mg Oral Daily  . patient's guide to using coumadin book   Does not apply Once  . polyethylene glycol  17 g Oral BID  . potassium chloride  10 mEq Intravenous Q1 Hr x 2  . simvastatin  20 mg Oral q1800  . sodium chloride  3 mL  Intravenous Q12H  . sodium phosphate  1 enema Rectal Once  . sulfamethoxazole-trimethoprim  320 mg Intravenous Q12H    Objective: Vital signs in last 24 hours: Temp:  [97.6 F (36.4 C)-99.2 F (37.3 C)] 98.6 F (37 C) (03/20 0600) Pulse Rate:  [90-98] 90 (03/20 0600) Resp:  [14-18] 18 (03/20 0600) BP: (119-155)/(70-89) 128/70 mmHg (03/20 0600) SpO2:  [95 %-100 %] 100 % (03/20 0600)   General appearance: alert, cooperative and depressed Incision/Wound:  LLE wrapped.   Lab Results  Recent Labs  12/13/12 0415 12/14/12 1155 12/15/12 0450  WBC 13.9*  --  11.3*  HGB 8.8*  --  7.1*  HCT 28.2*  --  22.4*  NA 137 136 136  K 3.1* 3.3* 3.4*  CL 100 100 101  CO2 28 27 26   BUN 9 6 6   CREATININE 1.11 1.04 1.09   Liver Panel No results found for this basename: PROT, ALBUMIN, AST, ALT, ALKPHOS, BILITOT, BILIDIR, IBILI,  in the last 72  hours Sedimentation Rate No results found for this basename: ESRSEDRATE,  in the last 72 hours C-Reactive Protein No results found for this basename: CRP,  in the last 72 hours  Microbiology: Recent Results (from the past 240 hour(s))  CULTURE, BLOOD (ROUTINE X 2)     Status: None   Collection Time    12/07/12 10:00 AM      Result Value Range Status   Specimen Description BLOOD LEFT FOREARM   Final   Special Requests BOTTLES DRAWN AEROBIC ONLY   Final   Culture  Setup Time 12/07/2012 13:45   Final   Culture NO GROWTH 5 DAYS   Final   Report Status 12/13/2012 FINAL   Final  URINE CULTURE     Status: None   Collection Time    12/07/12 10:40 AM      Result Value Range Status   Specimen Description URINE, CLEAN CATCH   Final   Special Requests NONE   Final   Culture  Setup Time 12/07/2012 13:48   Final   Colony Count NO GROWTH   Final   Culture NO GROWTH   Final   Report Status 12/08/2012 FINAL   Final  CULTURE, BLOOD (ROUTINE X 2)     Status: None   Collection Time    12/07/12 10:50 AM      Result Value Range Status   Specimen Description BLOOD EFT FOREARM   Final   Special Requests BOTTLES DRAWN AEROBIC ONLY 5CC   Final   Culture  Setup Time 12/07/2012 13:45   Final   Culture NO GROWTH 5 DAYS   Final   Report Status 12/13/2012 FINAL   Final  MRSA PCR SCREENING     Status: None   Collection Time    12/08/12  4:33 AM      Result Value Range Status   MRSA by PCR NEGATIVE  NEGATIVE Final   Comment:            The GeneXpert MRSA Assay (FDA     approved for NASAL specimens     only), is one component of a     comprehensive MRSA colonization     surveillance program. It is not     intended to diagnose MRSA     infection nor to guide or     monitor treatment for     MRSA infections.  WOUND CULTURE     Status: None   Collection Time  12/09/12  4:31 PM      Result Value Range Status   Specimen Description FOOT   Final   Special Requests Normal   Final   Gram Stain      Final   Value: NO WBC SEEN     RARE SQUAMOUS EPITHELIAL CELLS PRESENT     RARE GRAM POSITIVE COCCI IN PAIRS   Culture MODERATE STENOTROPHOMONAS MALTOPHILIA   Final   Report Status 12/12/2012 FINAL   Final   Organism ID, Bacteria STENOTROPHOMONAS MALTOPHILIA   Final    Studies/Results: No results found.   Assessment/Plan: Poorly Controlled DM  Diabetic Foot wound (S/p transmet on 3-19)  Cx stenotrophomonas Anemia  HTN (improved)  Appears to be tolerating his anbx (denies rash) His recent revascularization will hopefully help with wound healing.  Will continue to watch his Cr, tolerance of anbx,  Txf threshold?  Total days of antibiotics: 9 (bactrim)  [name] Infectious Diseases [phone] 12/15/2012, 11:26 AM   LOS: 8 days

## 2012-12-15 NOTE — Progress Notes (Signed)
[facility] PROGRESS NOTE  JAMESON TORMEY EAV:[id number] DOB: 11/21/1960 DOA: 12/07/2012 PCP: Laurell Josephs, MD  Assessment/Plan: Sepsis :  - Ischemic necrotic ulcer at base of great toe/DM foot infection - completed 5 days of IV vancomycin and Zosyn, then transitioned to IV bactrim per ID  -Continue TMP/SMZ IV (12/12/12>>)  - Blood cultures -NGTD, wound Cx :stenotrophomonas maltophilia  - MRI without foot abscess--phlegmon to cortical surface of left calcaneal lateral tuberosity - fever curve down, WBC trend down  - ID Dr.Hatcher following  L great toe: ischemia with necrotic ulcer: PVD, and possible osteomyelitis in lateral calcaneous tuberosity  - LSFA PTA 11/29/12 per Dr.Berry, followed by 2 vessel run off  - given progression of the necrotic ulcer on IV abx, Dr. Lajoyce Corners was consulted for surgical options - called d/w Dr.Thompson Ortho today (3/18) who had seen the pt on admission,  - Appreciate Dr. Chestine Spore TMA on 12/14/12 - appreciate ID followup  - PICC placed 3/17  Nausea and vomiting  -Possibly related to underlying sepsis and infectious process superimposed upon suspected diabetic gastroparesis  -overall improved -ate subway last night (3/19) and this am (3/20) without vomiting Constipation  -partly due to opioids-->had BM 12/14/12  - increase miralax to BID, continue dulcolax and senokot  - KUB (3/16) unremarkable - tolerating carb modified diet Uncontrolled Type 2 diabetes mellitus  -Hemoglobin A1C 6.8  -Discontinue glipizide due to the patient's renal insufficiency and intermittent n.p.o. status for procedures  -continue novolog SSI while inpatient Iron deficiency anemia  -Anemia panel suggestive of iron deficiency.  No workup done in the past.  -Baseline hemoglobin around 9., s/p 2 units PRBC 3/13  -Needs GI workup as outpatient  -Given a dose of ferric gluconate  -start ferrous sulfate  Hypertension  -Continue amlodipine, metoprolol tartrate, and lisinopril   Hypokalemia -Replete -Check magnesium Anticoagulation -will try to contact Dr. Lajoyce Corners regarding warfarin order Possible CAD:  -stress test per cards as outpatient  Family Communication: d/w pt at bedside  Disposition Plan: home when cleared by ID and Ortho       Antibiotics:  Vanc 3/12-3/17  Zosyn 3/12-3/17  TMP/SMZ IV 12/12/12>>> Consultants:  Dr.Duda, Ortho 3/18  Dr.Thompson, Ortho  Dr.Berry, [facility]  ID: Dr.Hatcher  Procedures:  TMA 12/14/12     Procedures/Studies: Dg Chest 2 View  12/07/2012  *RADIOLOGY REPORT*  Clinical Data: Vomiting, nausea and abdominal pain.  CHEST - 2 VIEW  Comparison: 11/16/2012.  Findings: Trachea is midline.  Heart size is accentuated by somewhat low lung volumes and semi upright technique.  Probable scarring in the left suprahilar region, superimposed with osseous bridging between the left first and second anterior ribs.  Lungs are otherwise clear.  No pleural fluid.  IMPRESSION: No acute findings.   Original Report Authenticated By: Leanna Battles, M.D.    Dg Chest 2 View  11/16/2012  *RADIOLOGY REPORT*  Clinical Data: Pre hyperbaric oxygen therapy.  Hypertension.  Ex- smoker.  CHEST - 2 VIEW  Comparison: 03/09/2006  Findings: Post-traumatic  changes about the left hemithorax including the distal clavicle and anterior 1st/2nd ribs.  These are chronic. Midline trachea.  Normal heart size and mediastinal contours. No pleural effusion or pneumothorax.  Clear lungs.  IMPRESSION: No acute cardiopulmonary disease.   Original Report Authenticated By: Jeronimo Greaves, M.D.    Dg Abd 1 View  12/11/2012  *RADIOLOGY REPORT*  Clinical Data: Abdominal pain.  Abdominal distention. Constipation.  ABDOMEN - 1 VIEW  Comparison: 12/07/2012  Findings: Gas pattern is  normal without evidence of ileus, obstruction or apparent free air.  There is a normal amount of fecal matter in the colon.  No abnormal calcifications or acute bony findings.  IMPRESSION: Gas pattern and fecal  burden within normal limits.   Original Report Authenticated By: Paulina Fusi, M.D.    Dg Abd 1 View  12/07/2012  *RADIOLOGY REPORT*  Clinical Data: Mid abdominal pain.  Constipation.  ABDOMEN - 1 VIEW  Comparison: None.  Findings: No free intraperitoneal air is identified.  Bowel gas pattern is normal.  No abnormal abdominal calcification or focal bony abnormality.  There is some right hip degenerative disease.  IMPRESSION: No acute finding.   Original Report Authenticated By: Holley Dexter, M.D.    Mr Foot Left W Wo Contrast  12/07/2012  *RADIOLOGY REPORT*  Clinical Data: Osteomyelitis.  Fever.  Sepsis.  Diabetic foot. Foot ulceration.  MRI OF THE LEFT FOREFOOT WITHOUT AND WITH CONTRAST,MRI OF THE LEFT ANKLE WITHOUT AND WITH CONTRAST  Technique:  Multiplanar, multisequence MR imaging was performed both before and after administration of intravenous contrast.  Contrast: 15mL MULTIHANCE GADOBENATE DIMEGLUMINE 529 MG/ML IV SOLN  Comparison: None.  Findings: Motion artifact is present on the examination.  Diabetic myositis is present in the plantar foot musculature.  Diffuse subcutaneous edema and enhancement compatible with cellulitis.  There is no soft tissue abscess identified.  There is a dorsal forefoot ulceration overlying the proximal phalanx of the great toe however this is not extend to the bony surface.  There is a blister along the lateral aspect of the great toe.  No soft tissue abscess is present.  Small effusion of the first MTP joint.  Marginal erosions are present in the first MTP, suggesting gout arthritis over pressure erosion.  Correlation with serum uric acid is recommended.  IMPRESSION: Negative for osteomyelitis of the forefoot.  Dorsal forefoot ulceration overlying the proximal phalanx of the great toe and blistering along the lateral aspect of the great toe.  No soft tissue abscess.  MRI LEFT ANKLE WITH AND WITHOUT CONTRAST  Technique:  Multiplanar, multisequence MR imaging of the left  ankle was performed before and after the administration of intravenous contrast.  Contrast: 15mL MULTIHANCE GADOBENATE DIMEGLUMINE 529 MG/ML IV SOLN  Comparison:  10/16/2012.  Findings: Diffuse subcutaneous edema is present which may represent dependent edema or cellulitis in the appropriate clinical setting.  Diabetic myopathy is present with some fatty atrophy of the plantar foot musculature.  There is a new ulcer along the lateral aspects of the left calcaneal tuberosity with phlegmon tracking to the cortical surface and osteomyelitis of the lateral calcaneal tuberosity (image number 20 series 17).  These marrow changes are new compared to the prior exam of 10/16/2012 and the ulcer appears new as well.  Midfoot appears within normal limits without evidence of erosions.  The flexor and extensor tendons appear within normal limits.  Talus appears normal.  Medial and lateral malleolus and the tibial plafond are within normal limits.  Small ankle and subtalar effusions are likely reactive.  IMPRESSION: New small area of osteomyelitis of the lateral calcaneal tuberosity with posterior lateral heel ulcer tracking to the focus of osteomyelitis.  No abscess.   Original Report Authenticated By: Andreas Newport, M.D.    Mr Ankle Left W Wo Contrast  12/07/2012  *RADIOLOGY REPORT*  Clinical Data: Osteomyelitis.  Fever.  Sepsis.  Diabetic foot. Foot ulceration.  MRI OF THE LEFT FOREFOOT WITHOUT AND WITH CONTRAST,MRI OF THE LEFT ANKLE WITHOUT AND WITH  CONTRAST  Technique:  Multiplanar, multisequence MR imaging was performed both before and after administration of intravenous contrast.  Contrast: 15mL MULTIHANCE GADOBENATE DIMEGLUMINE 529 MG/ML IV SOLN  Comparison: None.  Findings: Motion artifact is present on the examination.  Diabetic myositis is present in the plantar foot musculature.  Diffuse subcutaneous edema and enhancement compatible with cellulitis.  There is no soft tissue abscess identified.  There is a dorsal  forefoot ulceration overlying the proximal phalanx of the great toe however this is not extend to the bony surface.  There is a blister along the lateral aspect of the great toe.  No soft tissue abscess is present.  Small effusion of the first MTP joint.  Marginal erosions are present in the first MTP, suggesting gout arthritis over pressure erosion.  Correlation with serum uric acid is recommended.  IMPRESSION: Negative for osteomyelitis of the forefoot.  Dorsal forefoot ulceration overlying the proximal phalanx of the great toe and blistering along the lateral aspect of the great toe.  No soft tissue abscess.  MRI LEFT ANKLE WITH AND WITHOUT CONTRAST  Technique:  Multiplanar, multisequence MR imaging of the left ankle was performed before and after the administration of intravenous contrast.  Contrast: 15mL MULTIHANCE GADOBENATE DIMEGLUMINE 529 MG/ML IV SOLN  Comparison:  10/16/2012.  Findings: Diffuse subcutaneous edema is present which may represent dependent edema or cellulitis in the appropriate clinical setting.  Diabetic myopathy is present with some fatty atrophy of the plantar foot musculature.  There is a new ulcer along the lateral aspects of the left calcaneal tuberosity with phlegmon tracking to the cortical surface and osteomyelitis of the lateral calcaneal tuberosity (image number 20 series 17).  These marrow changes are new compared to the prior exam of 10/16/2012 and the ulcer appears new as well.  Midfoot appears within normal limits without evidence of erosions.  The flexor and extensor tendons appear within normal limits.  Talus appears normal.  Medial and lateral malleolus and the tibial plafond are within normal limits.  Small ankle and subtalar effusions are likely reactive.  IMPRESSION: New small area of osteomyelitis of the lateral calcaneal tuberosity with posterior lateral heel ulcer tracking to the focus of osteomyelitis.  No abscess.   Original Report Authenticated By: Andreas Newport,  M.D.          Subjective: Patient complains of some nausea without vomiting. He denies fevers, chills, chest pain, shortness breath, vomiting, diarrhea, abdominal pain, dizziness.  Objective: Filed Vitals:   12/14/12 1945 12/14/12 2200 12/15/12 0300 12/15/12 0600  BP: 133/78 129/72  128/70  Pulse: 90 98  90  Temp: 98.3 F (36.8 C) 98.8 F (37.1 C) 98.5 F (36.9 C) 98.6 F (37 C)  TempSrc:  Oral Oral Oral  Resp: 16 18  18   Height:      Weight:      SpO2: 95% 100%  100%    Intake/Output Summary (Last 24 hours) at 12/15/12 1042 Last data filed at 12/15/12 7846  Gross per 24 hour  Intake    500 ml  Output    910 ml  Net   -410 ml   Weight change:  Exam:   General:  Pt is alert, follows commands appropriately, not in acute distress  HEENT: No icterus, No thrush,  [place]/AT  Cardiovascular: RRR, S1/S2, no rubs, no gallops  Respiratory: CTA bilaterally, no wheezing, no crackles, no rhonchi  Abdomen: Soft/+BS, non tender, non distended, no guarding  Extremities: Right lower extremity in surgical dressing; left  lower extremity without any edema or lymphangitis  Data Reviewed: Basic Metabolic Panel:  Recent Labs Lab 12/11/12 0416 12/12/12 0408 12/13/12 0415 12/14/12 1155 12/15/12 0450  NA 133* 136 137 136 136  K 3.4* 3.2* 3.1* 3.3* 3.4*  CL 96 97 100 100 101  CO2 30 28 28 27 26   GLUCOSE 194* 145* 125* 130* 182*  BUN 14 11 9 6 6   CREATININE 1.21 1.17 1.11 1.04 1.09  CALCIUM 7.9* 8.2* 8.1* 8.4 8.1*   Liver Function Tests: No results found for this basename: AST, ALT, ALKPHOS, BILITOT, PROT, ALBUMIN,  in the last 168 hours  Recent Labs Lab 12/09/12 1711  LIPASE 13  AMYLASE 30   No results found for this basename: AMMONIA,  in the last 168 hours CBC:  Recent Labs Lab 12/10/12 0530 12/11/12 0416 12/12/12 0408 12/13/12 0415 12/15/12 0450  WBC 24.2* 17.1* 16.3* 13.9* 11.3*  HGB 8.4* 8.0* 8.6* 8.8* 7.1*  HCT 26.4* 25.4* 26.7* 28.2* 22.4*  MCV  74.6* 74.3* 75.0* 75.0* 74.9*  PLT 483* 479* 570* 565* 526*   Cardiac Enzymes: No results found for this basename: CKTOTAL, CKMB, CKMBINDEX, TROPONINI,  in the last 168 hours BNP: No components found with this basename: POCBNP,  CBG:  Recent Labs Lab 12/14/12 1127 12/14/12 1638 12/14/12 1840 12/14/12 2140 12/15/12 0749  GLUCAP 118* 227* 187* 125* 181*    Recent Results (from the past 240 hour(s))  CULTURE, BLOOD (ROUTINE X 2)     Status: None   Collection Time    12/07/12 10:00 AM      Result Value Range Status   Specimen Description BLOOD LEFT FOREARM   Final   Special Requests BOTTLES DRAWN AEROBIC ONLY   Final   Culture  Setup Time 12/07/2012 13:45   Final   Culture NO GROWTH 5 DAYS   Final   Report Status 12/13/2012 FINAL   Final  URINE CULTURE     Status: None   Collection Time    12/07/12 10:40 AM      Result Value Range Status   Specimen Description URINE, CLEAN CATCH   Final   Special Requests NONE   Final   Culture  Setup Time 12/07/2012 13:48   Final   Colony Count NO GROWTH   Final   Culture NO GROWTH   Final   Report Status 12/08/2012 FINAL   Final  CULTURE, BLOOD (ROUTINE X 2)     Status: None   Collection Time    12/07/12 10:50 AM      Result Value Range Status   Specimen Description BLOOD EFT FOREARM   Final   Special Requests BOTTLES DRAWN AEROBIC ONLY 5CC   Final   Culture  Setup Time 12/07/2012 13:45   Final   Culture NO GROWTH 5 DAYS   Final   Report Status 12/13/2012 FINAL   Final  MRSA PCR SCREENING     Status: None   Collection Time    12/08/12  4:33 AM      Result Value Range Status   MRSA by PCR NEGATIVE  NEGATIVE Final   Comment:            The GeneXpert MRSA Assay (FDA     approved for NASAL specimens     only), is one component of a     comprehensive MRSA colonization     surveillance program. It is not     intended to diagnose MRSA     infection nor  to guide or     monitor treatment for     MRSA infections.  WOUND CULTURE      Status: None   Collection Time    12/09/12  4:31 PM      Result Value Range Status   Specimen Description FOOT   Final   Special Requests Normal   Final   Gram Stain     Final   Value: NO WBC SEEN     RARE SQUAMOUS EPITHELIAL CELLS PRESENT     RARE GRAM POSITIVE COCCI IN PAIRS   Culture MODERATE STENOTROPHOMONAS MALTOPHILIA   Final   Report Status 12/12/2012 FINAL   Final   Organism ID, Bacteria STENOTROPHOMONAS MALTOPHILIA   Final     Scheduled Meds: . amLODipine  10 mg Oral Daily  . aspirin EC  162 mg Oral Daily  . bisacodyl  10 mg Oral q12n4p  . collagenase  1 application Topical q morning - 10a  . docusate sodium  100 mg Oral BID  . enoxaparin (LOVENOX) injection  40 mg Subcutaneous Q24H  . feeding supplement  237 mL Oral BID BM  . ferrous sulfate  325 mg Oral BID WC  . insulin aspart  0-15 Units Subcutaneous TID WC  . lisinopril  20 mg Oral Daily  . metoprolol tartrate  50 mg Oral BID  . multivitamin with minerals  1 tablet Oral Daily  . nutrition supplement  1 packet Oral BID BM  . pantoprazole  40 mg Oral Daily  . patient's guide to using coumadin book   Does not apply Once  . polyethylene glycol  17 g Oral BID  . simvastatin  20 mg Oral q1800  . sodium chloride  3 mL Intravenous Q12H  . sodium phosphate  1 enema Rectal Once  . sulfamethoxazole-trimethoprim  320 mg Intravenous Q12H  . warfarin   Does not apply Once  . Warfarin - Pharmacist Dosing Inpatient   Does not apply q1800   Continuous Infusions: . sodium chloride 50 mL/hr at 12/14/12 1944  . lactated ringers       Zerek Litsey, DO  [facility] Pager [phone]  If 7PM-7AM, please contact night-coverage [email or web address] Password [email or web address] 12/15/2012, 10:42 AM   LOS: 8 days
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
## 2012-12-15 NOTE — Progress Notes (Signed)
PT NOTE: PT deferred this date at request of pt "I just had surgery yesterday".  Pt agreeable to reattempt in am.  Will follow.

## 2012-12-15 NOTE — Progress Notes (Signed)
Progress notes reviewed, s/p midfoot amputation yesterday. Seems to have tolerated surgery without any cardiac complications. Would continue current dose anti-hypertensive medications. Dr. Allyson Sabal would like to follow-up with the patient in the office after discharge - possible stress testing.  Will sign-off for now .Marland Kitchen Call with questions.  Chrystie Nose, MD, [facility] Attending Cardiologist [facility]

## 2012-12-15 NOTE — Progress Notes (Signed)
Discussed case in  long length of stay meeting.

## 2012-12-16 LAB — CBC
Hemoglobin: 6.8 g/dL — CL (ref 13.0–17.0)
MCH: 24.5 pg — ABNORMAL LOW (ref 26.0–34.0)
MCV: 74.7 fL — ABNORMAL LOW (ref 78.0–100.0)
RBC: 2.77 MIL/uL — ABNORMAL LOW (ref 4.22–5.81)

## 2012-12-16 LAB — GLUCOSE, CAPILLARY
Glucose-Capillary: 177 mg/dL — ABNORMAL HIGH (ref 70–99)
Glucose-Capillary: 167 mg/dL — ABNORMAL HIGH (ref 70–99)

## 2012-12-16 LAB — BASIC METABOLIC PANEL
CO2: 25 mEq/L (ref 19–32)
Glucose, Bld: 158 mg/dL — ABNORMAL HIGH (ref 70–99)
Potassium: 4.1 mEq/L (ref 3.5–5.1)
Sodium: 132 mEq/L — ABNORMAL LOW (ref 135–145)

## 2012-12-16 LAB — PREPARE RBC (CROSSMATCH)

## 2012-12-16 MED ORDER — UNABLE TO FIND: Status: DC

## 2012-12-16 MED ORDER — RESOURCE INSTANT PROTEIN PO PWD PACKET
1.0000 | Freq: Three times a day (TID) | ORAL | Status: DC
  Filled 2012-12-16 (×8): qty 6

## 2012-12-16 NOTE — Progress Notes (Signed)
Patient ID: Keith Guzman, male   DOB: [birth year], 52 y.o.   MRN: [id number] Postoperative foot care orders are written. Patient may change dry dressing when necessary. Strict nonweightbearing left lower extremity. Wear postoperative shoe for gait training. I will followup in the office in 2 weeks.

## 2012-12-16 NOTE — Progress Notes (Signed)
CRITICAL VALUE ALERT  Critical value received:  Hgb: 6.8  Date of notification:  12/16/12  Time of notification:  0553  Critical value read back: yes  Nurse who received alert: M. Erle Crocker, RN  MD notified (1st page): L. Harduk  Time of first page:  0602  MD notified (2nd page): N/A  Time of second page: N/A  Responding MD: Wyn Forster  Time MD responded: 4540

## 2012-12-16 NOTE — Progress Notes (Signed)
Physical Therapy Treatment Patient Details Name: Keith Guzman MRN: [id number] DOB: [birth year] Today's Date: 12/16/2012 Time: 7829-5621 PT Time Calculation (min): 13 min  PT Assessment / Plan / Recommendation Comments on Treatment Session  Reevaluation s/p L midfoot ampuation 3/19. Pt requiring Min assist for mobility (+2 safety due to LOB/fatigue). Anticipate pt will progress well depending on participation level. Recommend HHPT, RW.     Follow Up Recommendations  Home health PT     Does the patient have the potential to tolerate intense rehabilitation     Barriers to Discharge        Equipment Recommendations  Rolling walker with 5" wheels    Recommendations for Other Services    Frequency Min 3X/week   Plan Discharge plan remains appropriate    Precautions / Restrictions Precautions Precautions: Fall Restrictions Weight Bearing Restrictions: Yes LLE Weight Bearing: Non weight bearing   Pertinent Vitals/Pain "its all right" L foot    Mobility  Bed Mobility Bed Mobility: Supine to Sit Supine to Sit: 7: Independent Transfers Transfers: Sit to Stand;Stand to Sit Sit to Stand: 4: Min assist;From bed;From elevated surface Stand to Sit: 4: Min assist;To chair/3-in-1 Details for Transfer Assistance: VCs safety, technique, hand placement. Assist to rise, stabilize, control descent Ambulation/Gait Ambulation/Gait Assistance: 1: +2 Total assist Ambulation/Gait: Patient Percentage: 80% Ambulation Distance (Feet): 20 Feet Assistive device: Rolling walker Ambulation/Gait Assistance Details: VCS safety, technique, sequence, adherence to NWB status. LOB x 1 whle ambulating requiring external assistance to prevent fall. Fatigues fairly easily.  Gait Pattern: Step-to pattern    Exercises     PT Diagnosis:    PT Problem List:   PT Treatment Interventions:     PT Goals Acute Rehab PT Goals PT Goal Formulation: With patient Time For Goal Achievement: 12/30/12 Potential to  Achieve Goals: Good Pt will go Sit to Stand: with supervision PT Goal: Sit to Stand - Progress: Goal set today Pt will Ambulate: 51 - 150 feet;with supervision;with rolling walker PT Goal: Ambulate - Progress: Goal set today  Visit Information  Last PT Received On: 12/16/12 Assistance Needed: +2 (safety)    Subjective Data  Subjective: I feel better.  Patient Stated Goal: home.    Cognition  Cognition Overall Cognitive Status: Appears within functional limits for tasks assessed/performed Arousal/Alertness: Awake/alert Orientation Level: Appears intact for tasks assessed Behavior During Session: [facility] for tasks performed    Balance     End of Session PT - End of Session Equipment Utilized During Treatment: Gait belt Activity Tolerance: Patient limited by fatigue Patient left: in chair;with call bell/phone within reach   GP     Rebeca Alert, MPT Pager: [phone]

## 2012-12-16 NOTE — Progress Notes (Signed)
NUTRITION FOLLOW UP  Intervention:   Discontinue Glucerna and Juven Provide Beneprotein TID Continue Multivitamin with minerals May want to consider alternative anti-nausea medication and appetite stimulant   Nutrition Dx:   Unintentional wt loss related to abdominal pain as evidenced by pt reports of 9% wt loss in 5 weeks; ongoing  Goal:   Pt to meet >/= 90% of their estimated nutrition needs; not met  Monitor:   Po intake; 25% of meals Wt; no new wt Blood Glucose  Assessment:   Pt reports that he has only been eating about 25% of meals due to nausea and because he doesn't like the food at [facility]. Pt states he hasn't been drinking Glucerna or Juven because they make him throw up. Pt also reports taste changes. Pt vomited at time of visit. Pt thinks that the medications he has been getting are causing the nausea and vomiting. Glucose continues to be high despite pt report of not eating much.  Height: Ht Readings from Last 1 Encounters:  12/07/12 5' 11.5" (1.816 m)    Weight Status:   Wt Readings from Last 1 Encounters:  12/08/12 150 lb 2.1 oz (68.1 kg)    Re-estimated needs:  Kcal: 1860-2040  Protein: 82-95 grams  Fluid: 2.2 L  Skin: diabetic ulcer on left heel  Diet Order: Carb Control   Intake/Output Summary (Last 24 hours) at 12/16/12 1604 Last data filed at 12/16/12 1445  Gross per 24 hour  Intake 2400.83 ml  Output   1050 ml  Net 1350.83 ml    Last BM: 3/18   Labs:   Recent Labs Lab 12/14/12 1155 12/15/12 0450 12/16/12 0519  NA 136 136 132*  K 3.3* 3.4* 4.1  CL 100 101 100  CO2 27 26 25   BUN 6 6 6   CREATININE 1.04 1.09 1.11  CALCIUM 8.4 8.1* 8.2*  MG  --   --  1.9  GLUCOSE 130* 182* 158*    CBG (last 3)   Recent Labs  12/15/12 2204 12/16/12 0758 12/16/12 1152  GLUCAP 104* 138* 102*    Scheduled Meds: . amLODipine  10 mg Oral Daily  . aspirin EC  162 mg Oral Daily  . bisacodyl  10 mg Oral q12n4p  . collagenase  1 application  Topical q morning - 10a  . docusate sodium  100 mg Oral BID  . enoxaparin (LOVENOX) injection  40 mg Subcutaneous Q24H  . feeding supplement  237 mL Oral BID BM  . ferrous sulfate  325 mg Oral BID WC  . insulin aspart  0-15 Units Subcutaneous TID WC  . lisinopril  20 mg Oral Daily  . metoprolol tartrate  50 mg Oral BID  . multivitamin with minerals  1 tablet Oral Daily  . nutrition supplement  1 packet Oral BID BM  . pantoprazole  40 mg Oral Daily  . patient's guide to using coumadin book   Does not apply Once  . polyethylene glycol  17 g Oral BID  . simvastatin  20 mg Oral q1800  . sodium chloride  3 mL Intravenous Q12H  . sodium phosphate  1 enema Rectal Once  . sulfamethoxazole-trimethoprim  320 mg Intravenous Q12H    Continuous Infusions: . sodium chloride 50 mL/hr at 12/15/12 1900  . lactated ringers      Ian Malkin RD, LDN Inpatient Clinical Dietitian Pager: [phone] After Hours Pager: [phone]

## 2012-12-16 NOTE — Progress Notes (Signed)
12/16/2012 [name] BSN RN CCM [phone] Patient is post op mid foot amputation 12/14/2012. Plans are for patient to return to his home where spouse will be caregiver.  Per Dt Tat, patient will need home antibiotics x 10 days, HHRN, PT Prescription for bactrim home antibiotic sent to [facility] infusion-[phone]. Advised [facility] rep-mary that patient will require HHpt/rn upon discharge. Orders for services faxed to [phone]- CM called [facility] infusion [phone] regarding IV prescription and expected discharge date; spoke with stephanie; she has received prescription but will need H&p and lab results; requested information ws sent to [phone]. confirmation received.  lOCATION OF PICC LINE -LEFT BRACHIAL, SINGLE LUMEN-5 FRENCH, 42 CM LONG-INFO GIVEN TO [facility] pharmacist at request Received call from [facility] at [phone] that they currently do not have bactrim-IV abx available; will not have til Monday03/24/2014. [facility] ADVISED.  WILL ADVISE SAT-CM  OF THE ABOVE . ATTENDING MD-Tat NOTIFIED OF THE ABOVE. Cm will con't search for infusion company that has abx available.

## 2012-12-16 NOTE — Progress Notes (Addendum)
Pharmacy - Brief Note  52 yo M with gangrene of left foot. S/p mid foot amputation 3/19. Started on coumadin 3/19 PM for VTE prophylaxis, clarified with Dr. Audrie Lia office 3/20 if warfarin indicated s/p amputation for VTE prophylaxis and received orders to stop warfarin.  Daily INR continued d/t warfarin 10mg  given and patient on IV TMP/SMZ   INR 3/21 = 2.24 effect of warfarin 10mg  x 1 on 3/19 and TMP/SMZ enhancing effects  Hgb = 6.8 (orders to tranfuse PRBC)  Continues on lovenox 40mg  SQ q24h  Plan:  Continue daily INR  Consider vitamin K 2.5mg  PO x 1  Day #5 TMP/SMZ (D#10 total abx therapy) for S. maltophila in foot wound  Continue IV TMP/SMZ as ordered (appropriate for renal fx)  Continue to monitor renal function and K+  Juliette Alcide, PharmD, BCPS.   Pager: [phone]  12/16/2012 8:51 AM

## 2012-12-16 NOTE — Progress Notes (Signed)
INFECTIOUS DISEASE PROGRESS NOTE  ID: GEDEON BRANDOW is a 52 y.o. male with   Principal Problem:   Sepsis, gangrene Lt great toe Active Problems:   Type II or unspecified type diabetes mellitus with unspecified complication, uncontrolled   ANEMIA, microcytic- Hgb down to 6.7 12/07/12- transfused   PVD, LSFA PTA 11/29/12   Non-healing ulcer of foot   HTN (hypertension), poor control   History of smoking. quit 2005   Sinus tachycardia   Skin ulcer of left lower leg   Osteomyelitis of ankle, left foot and ankle- MRI 12/07/12   Constipation  Subjective: Without complaints  Abtx:  Anti-infectives   Start     Dose/Rate Route Frequency Ordered Stop   12/12/12 1600  sulfamethoxazole-trimethoprim (BACTRIM) 320 mg in dextrose 5 % 500 mL IVPB     320 mg 346.7 mL/hr over 90 Minutes Intravenous Every 12 hours 12/12/12 1434     12/12/12 0600  vancomycin (VANCOCIN) 750 mg in sodium chloride 0.9 % 150 mL IVPB  Status:  Discontinued     750 mg 150 mL/hr over 60 Minutes Intravenous Every 12 hours 12/11/12 2021 12/12/12 1413   12/09/12 2000  vancomycin (VANCOCIN) 750 mg in sodium chloride 0.9 % 150 mL IVPB  Status:  Discontinued     750 mg 150 mL/hr over 60 Minutes Intravenous Every 8 hours 12/09/12 1252 12/11/12 2011   12/08/12 0000  vancomycin (VANCOCIN) 1,250 mg in sodium chloride 0.9 % 250 mL IVPB  Status:  Discontinued     1,250 mg 166.7 mL/hr over 90 Minutes Intravenous Every 12 hours 12/07/12 1027 12/09/12 1251   12/07/12 2000  piperacillin-tazobactam (ZOSYN) IVPB 3.375 g  Status:  Discontinued     3.375 g 12.5 mL/hr over 240 Minutes Intravenous 3 times per day 12/07/12 1027 12/12/12 1413   12/07/12 1800  piperacillin-tazobactam (ZOSYN) IVPB 3.375 g  Status:  Discontinued     3.375 g 12.5 mL/hr over 240 Minutes Intravenous 3 times per day 12/07/12 1602 12/07/12 1615   12/07/12 1130  vancomycin (VANCOCIN) 1,250 mg in sodium chloride 0.9 % 250 mL IVPB     1,250 mg 166.7 mL/hr over 90  Minutes Intravenous  Once 12/07/12 1026 12/07/12 1354   12/07/12 1100  piperacillin-tazobactam (ZOSYN) IVPB 3.375 g     3.375 g 100 mL/hr over 30 Minutes Intravenous STAT 12/07/12 1026 12/07/12 1224      Medications:  Scheduled: . amLODipine  10 mg Oral Daily  . aspirin EC  162 mg Oral Daily  . bisacodyl  10 mg Oral q12n4p  . collagenase  1 application Topical q morning - 10a  . docusate sodium  100 mg Oral BID  . enoxaparin (LOVENOX) injection  40 mg Subcutaneous Q24H  . feeding supplement  237 mL Oral BID BM  . ferrous sulfate  325 mg Oral BID WC  . insulin aspart  0-15 Units Subcutaneous TID WC  . lisinopril  20 mg Oral Daily  . metoprolol tartrate  50 mg Oral BID  . multivitamin with minerals  1 tablet Oral Daily  . nutrition supplement  1 packet Oral BID BM  . pantoprazole  40 mg Oral Daily  . patient's guide to using coumadin book   Does not apply Once  . polyethylene glycol  17 g Oral BID  . simvastatin  20 mg Oral q1800  . sodium chloride  3 mL Intravenous Q12H  . sodium phosphate  1 enema Rectal Once  . sulfamethoxazole-trimethoprim  320  mg Intravenous Q12H    Objective: Vital signs in last 24 hours: Temp:  [98.2 F (36.8 C)-100.1 F (37.8 C)] 98.4 F (36.9 C) (03/21 0837) Pulse Rate:  [59-106] 59 (03/21 0837) Resp:  [14-18] 14 (03/21 0837) BP: (129-146)/(74-83) 131/74 mmHg (03/21 0837) SpO2:  [93 %-99 %] 99 % (03/21 0837)   General appearance: alert, cooperative and no distress Incision/Wound: dressed RLE.  Lab Results  Recent Labs  12/15/12 0450 12/16/12 0519  WBC 11.3* 12.6*  HGB 7.1* 6.8*  HCT 22.4* 20.7*  NA 136 132*  K 3.4* 4.1  CL 101 100  CO2 26 25  BUN 6 6  CREATININE 1.09 1.11   Liver Panel No results found for this basename: PROT, ALBUMIN, AST, ALT, ALKPHOS, BILITOT, BILIDIR, IBILI,  in the last 72 hours Sedimentation Rate No results found for this basename: ESRSEDRATE,  in the last 72 hours C-Reactive Protein No results found for  this basename: CRP,  in the last 72 hours  Microbiology: Recent Results (from the past 240 hour(s))  CULTURE, BLOOD (ROUTINE X 2)     Status: None   Collection Time    12/07/12 10:00 AM      Result Value Range Status   Specimen Description BLOOD LEFT FOREARM   Final   Special Requests BOTTLES DRAWN AEROBIC ONLY   Final   Culture  Setup Time 12/07/2012 13:45   Final   Culture NO GROWTH 5 DAYS   Final   Report Status 12/13/2012 FINAL   Final  URINE CULTURE     Status: None   Collection Time    12/07/12 10:40 AM      Result Value Range Status   Specimen Description URINE, CLEAN CATCH   Final   Special Requests NONE   Final   Culture  Setup Time 12/07/2012 13:48   Final   Colony Count NO GROWTH   Final   Culture NO GROWTH   Final   Report Status 12/08/2012 FINAL   Final  CULTURE, BLOOD (ROUTINE X 2)     Status: None   Collection Time    12/07/12 10:50 AM      Result Value Range Status   Specimen Description BLOOD EFT FOREARM   Final   Special Requests BOTTLES DRAWN AEROBIC ONLY 5CC   Final   Culture  Setup Time 12/07/2012 13:45   Final   Culture NO GROWTH 5 DAYS   Final   Report Status 12/13/2012 FINAL   Final  MRSA PCR SCREENING     Status: None   Collection Time    12/08/12  4:33 AM      Result Value Range Status   MRSA by PCR NEGATIVE  NEGATIVE Final   Comment:            The GeneXpert MRSA Assay (FDA     approved for NASAL specimens     only), is one component of a     comprehensive MRSA colonization     surveillance program. It is not     intended to diagnose MRSA     infection nor to guide or     monitor treatment for     MRSA infections.  WOUND CULTURE     Status: None   Collection Time    12/09/12  4:31 PM      Result Value Range Status   Specimen Description FOOT   Final   Special Requests Normal   Final   Gram Stain  Final   Value: NO WBC SEEN     RARE SQUAMOUS EPITHELIAL CELLS PRESENT     RARE GRAM POSITIVE COCCI IN PAIRS   Culture MODERATE  STENOTROPHOMONAS MALTOPHILIA   Final   Report Status 12/12/2012 FINAL   Final   Organism ID, Bacteria STENOTROPHOMONAS MALTOPHILIA   Final    Studies/Results: No results found.   Assessment/Plan:  Poorly Controlled DM  Diabetic Foot wound (S/p transmet on 3-19)  Cx stenotrophomonas  Anemia  HTN (improved)  Total days of antibiotics: 10 (bactrim)  Would plan for 10 more days of anbx Will see him in ID clinic in f/u.   [name] Infectious Diseases [phone] 12/16/2012, 10:39 AM   LOS: 9 days

## 2012-12-16 NOTE — Progress Notes (Signed)
[facility] PROGRESS NOTE  Keith Guzman YNW:[id number] DOB: 01-28-1961 DOA: 12/07/2012 PCP: Laurell Josephs, MD  Assessment/Plan: Sepsis   - Ischemic necrotic ulcer at base of great toe/DM foot infection  - completed 5 days of IV vancomycin and Zosyn, then transitioned to IV bactrim per ID  -Continue TMP/SMZ IV (12/12/12>>)  - Blood cultures -NGTD, wound Cx :stenotrophomonas maltophilia  - MRI without foot abscess--phlegmon to cortical surface of left calcaneal lateral tuberosity  - fever curve down, WBC trend down  - ID Dr.Hatcher following  -Discussed with Dr. Cassie Freer Bactrim thru 12/26/12 L great toe: ischemia with necrotic ulcer: PVD, and possible osteomyelitis in lateral calcaneous tuberosity  - LSFA PTA 11/29/12 per Dr.Berry, followed by 2 vessel run off  - given progression of the necrotic ulcer on IV abx, Dr. Lajoyce Corners was consulted for surgical options  - called d/w Dr.Thompson Ortho today (3/18) who had seen the pt on admission,  - Appreciate Dr. Chestine Spore TMA on 12/14/12  - appreciate ID followup  - PICC placed 3/17  Nausea and vomiting  -Possibly related to underlying sepsis and infectious process superimposed upon suspected diabetic gastroparesis  -overall improved  -ate subway last night (3/19) and this am (3/20) without vomiting  -ate [employer] today (3/21) Constipation  -improved, had BM 12/16/12 -partly due to opioids-->had BM 12/14/12  - increase miralax to BID, continue dulcolax and senokot  - KUB (3/16) unremarkable  - tolerating carb modified diet   Uncontrolled Type 2 diabetes mellitus  -Hemoglobin A1C 6.8  -Discontinue glipizide due to the patient's renal insufficiency and intermittent n.p.o. status for procedures  -continue novolog SSI while inpatient -Plan to resume glipizide at the time of discharge  Iron deficiency anemia  -Anemia panel suggestive of iron deficiency. No workup done in the past.  -Baseline hemoglobin around 9., s/p 2 units PRBC 3/13   -Needs GI workup as outpatient  -Given a dose of ferric gluconate  -start ferrous sulfate twice a day -Transfused 2 units PRBCs (12/07/2012, 12/16/2012)--4 units total for the hospitalization Deconditioning -Home health PT was set up Hypertension  -Continue amlodipine, metoprolol tartrate, and lisinopril  Hypokalemia  -Repleted -Check magnesium--1.9 Coagulopathy -Due to warfarin dose March 19, discontinue warfarin Possible CAD:  -stress test per cards as outpatient  Family Communication: d/w pt at bedside  Disposition Plan: home when cleared by ID and Ortho  Antibiotics:  Vanc 3/12-3/17  Zosyn 3/12-3/17  TMP/SMZ IV 12/12/12>>> Consultants:  Dr.Duda, Ortho 3/18  Dr.Thompson, Ortho  Dr.Berry, [facility]  ID: Dr.Hatcher  Procedures:  TMA 12/14/12    Family Communication:   Pt at beside Disposition Plan:   Home 12/17/12       Procedures/Studies: Dg Chest 2 View  12/07/2012  *RADIOLOGY REPORT*  Clinical Data: Vomiting, nausea and abdominal pain.  CHEST - 2 VIEW  Comparison: 11/16/2012.  Findings: Trachea is midline.  Heart size is accentuated by somewhat low lung volumes and semi upright technique.  Probable scarring in the left suprahilar region, superimposed with osseous bridging between the left first and second anterior ribs.  Lungs are otherwise clear.  No pleural fluid.  IMPRESSION: No acute findings.   Original Report Authenticated By: Leanna Battles, M.D.    Dg Chest 2 View  11/16/2012  *RADIOLOGY REPORT*  Clinical Data: Pre hyperbaric oxygen therapy.  Hypertension.  Ex- smoker.  CHEST - 2 VIEW  Comparison: 03/09/2006  Findings: Post-traumatic  changes about the left hemithorax including the distal clavicle and anterior 1st/2nd ribs.  These are chronic.  Midline trachea.  Normal heart size and mediastinal contours. No pleural effusion or pneumothorax.  Clear lungs.  IMPRESSION: No acute cardiopulmonary disease.   Original Report Authenticated By: Jeronimo Greaves, M.D.    Dg Abd 1  View  12/11/2012  *RADIOLOGY REPORT*  Clinical Data: Abdominal pain.  Abdominal distention. Constipation.  ABDOMEN - 1 VIEW  Comparison: 12/07/2012  Findings: Gas pattern is normal without evidence of ileus, obstruction or apparent free air.  There is a normal amount of fecal matter in the colon.  No abnormal calcifications or acute bony findings.  IMPRESSION: Gas pattern and fecal burden within normal limits.   Original Report Authenticated By: Paulina Fusi, M.D.    Dg Abd 1 View  12/07/2012  *RADIOLOGY REPORT*  Clinical Data: Mid abdominal pain.  Constipation.  ABDOMEN - 1 VIEW  Comparison: None.  Findings: No free intraperitoneal air is identified.  Bowel gas pattern is normal.  No abnormal abdominal calcification or focal bony abnormality.  There is some right hip degenerative disease.  IMPRESSION: No acute finding.   Original Report Authenticated By: Holley Dexter, M.D.    Mr Foot Left W Wo Contrast  12/07/2012  *RADIOLOGY REPORT*  Clinical Data: Osteomyelitis.  Fever.  Sepsis.  Diabetic foot. Foot ulceration.  MRI OF THE LEFT FOREFOOT WITHOUT AND WITH CONTRAST,MRI OF THE LEFT ANKLE WITHOUT AND WITH CONTRAST  Technique:  Multiplanar, multisequence MR imaging was performed both before and after administration of intravenous contrast.  Contrast: 15mL MULTIHANCE GADOBENATE DIMEGLUMINE 529 MG/ML IV SOLN  Comparison: None.  Findings: Motion artifact is present on the examination.  Diabetic myositis is present in the plantar foot musculature.  Diffuse subcutaneous edema and enhancement compatible with cellulitis.  There is no soft tissue abscess identified.  There is a dorsal forefoot ulceration overlying the proximal phalanx of the great toe however this is not extend to the bony surface.  There is a blister along the lateral aspect of the great toe.  No soft tissue abscess is present.  Small effusion of the first MTP joint.  Marginal erosions are present in the first MTP, suggesting gout arthritis over  pressure erosion.  Correlation with serum uric acid is recommended.  IMPRESSION: Negative for osteomyelitis of the forefoot.  Dorsal forefoot ulceration overlying the proximal phalanx of the great toe and blistering along the lateral aspect of the great toe.  No soft tissue abscess.  MRI LEFT ANKLE WITH AND WITHOUT CONTRAST  Technique:  Multiplanar, multisequence MR imaging of the left ankle was performed before and after the administration of intravenous contrast.  Contrast: 15mL MULTIHANCE GADOBENATE DIMEGLUMINE 529 MG/ML IV SOLN  Comparison:  10/16/2012.  Findings: Diffuse subcutaneous edema is present which may represent dependent edema or cellulitis in the appropriate clinical setting.  Diabetic myopathy is present with some fatty atrophy of the plantar foot musculature.  There is a new ulcer along the lateral aspects of the left calcaneal tuberosity with phlegmon tracking to the cortical surface and osteomyelitis of the lateral calcaneal tuberosity (image number 20 series 17).  These marrow changes are new compared to the prior exam of 10/16/2012 and the ulcer appears new as well.  Midfoot appears within normal limits without evidence of erosions.  The flexor and extensor tendons appear within normal limits.  Talus appears normal.  Medial and lateral malleolus and the tibial plafond are within normal limits.  Small ankle and subtalar effusions are likely reactive.  IMPRESSION: New small area of osteomyelitis of the lateral calcaneal tuberosity with  posterior lateral heel ulcer tracking to the focus of osteomyelitis.  No abscess.   Original Report Authenticated By: Andreas Newport, M.D.    Mr Ankle Left W Wo Contrast  12/07/2012  *RADIOLOGY REPORT*  Clinical Data: Osteomyelitis.  Fever.  Sepsis.  Diabetic foot. Foot ulceration.  MRI OF THE LEFT FOREFOOT WITHOUT AND WITH CONTRAST,MRI OF THE LEFT ANKLE WITHOUT AND WITH CONTRAST  Technique:  Multiplanar, multisequence MR imaging was performed both before and  after administration of intravenous contrast.  Contrast: 15mL MULTIHANCE GADOBENATE DIMEGLUMINE 529 MG/ML IV SOLN  Comparison: None.  Findings: Motion artifact is present on the examination.  Diabetic myositis is present in the plantar foot musculature.  Diffuse subcutaneous edema and enhancement compatible with cellulitis.  There is no soft tissue abscess identified.  There is a dorsal forefoot ulceration overlying the proximal phalanx of the great toe however this is not extend to the bony surface.  There is a blister along the lateral aspect of the great toe.  No soft tissue abscess is present.  Small effusion of the first MTP joint.  Marginal erosions are present in the first MTP, suggesting gout arthritis over pressure erosion.  Correlation with serum uric acid is recommended.  IMPRESSION: Negative for osteomyelitis of the forefoot.  Dorsal forefoot ulceration overlying the proximal phalanx of the great toe and blistering along the lateral aspect of the great toe.  No soft tissue abscess.  MRI LEFT ANKLE WITH AND WITHOUT CONTRAST  Technique:  Multiplanar, multisequence MR imaging of the left ankle was performed before and after the administration of intravenous contrast.  Contrast: 15mL MULTIHANCE GADOBENATE DIMEGLUMINE 529 MG/ML IV SOLN  Comparison:  10/16/2012.  Findings: Diffuse subcutaneous edema is present which may represent dependent edema or cellulitis in the appropriate clinical setting.  Diabetic myopathy is present with some fatty atrophy of the plantar foot musculature.  There is a new ulcer along the lateral aspects of the left calcaneal tuberosity with phlegmon tracking to the cortical surface and osteomyelitis of the lateral calcaneal tuberosity (image number 20 series 17).  These marrow changes are new compared to the prior exam of 10/16/2012 and the ulcer appears new as well.  Midfoot appears within normal limits without evidence of erosions.  The flexor and extensor tendons appear within  normal limits.  Talus appears normal.  Medial and lateral malleolus and the tibial plafond are within normal limits.  Small ankle and subtalar effusions are likely reactive.  IMPRESSION: New small area of osteomyelitis of the lateral calcaneal tuberosity with posterior lateral heel ulcer tracking to the focus of osteomyelitis.  No abscess.   Original Report Authenticated By: Andreas Newport, M.D.          Subjective: Patient is feeling better. Vomiting has resolved. He is tolerating [employer] chicken nuggets today. Denies fevers, chills, chest pain, shortness breath, nausea, vomiting, diarrhea, abdominal pain. He had a large bowel movement today. No hematochezia or melena. No dysuria.  Objective: Filed Vitals:   12/16/12 1155 12/16/12 1300 12/16/12 1414 12/16/12 1435  BP: 151/82 134/80 137/82 137/80  Pulse: 95 89 89 92  Temp: 98.9 F (37.2 C) 98.9 F (37.2 C) 99.1 F (37.3 C) 98.8 F (37.1 C)  TempSrc: Oral Oral Oral Oral  Resp: 16 16 18 18   Height:      Weight:      SpO2: 97%  97% 98%    Intake/Output Summary (Last 24 hours) at 12/16/12 1626 Last data filed at 12/16/12 1445  Gross per 24  hour  Intake 2400.83 ml  Output   1050 ml  Net 1350.83 ml   Weight change:  Exam:   General:  Pt is alert, follows commands appropriately, not in acute distress  HEENT: No icterus, No thrush,[place]/AT  Cardiovascular: RRR, S1/S2, no rubs, no gallops  Respiratory: CTA bilaterally, no wheezing, no crackles, no rhonchi  Abdomen: Soft/+BS, non tender, non distended, no guarding  Extremities: No edema, No lymphangitis, No petechiae, No rashes, no synovitis  Data Reviewed: Basic Metabolic Panel:  Recent Labs Lab 12/12/12 0408 12/13/12 0415 12/14/12 1155 12/15/12 0450 12/16/12 0519  NA 136 137 136 136 132*  K 3.2* 3.1* 3.3* 3.4* 4.1  CL 97 100 100 101 100  CO2 28 28 27 26 25   GLUCOSE 145* 125* 130* 182* 158*  BUN 11 9 6 6 6   CREATININE 1.17 1.11 1.04 1.09 1.11  CALCIUM 8.2*  8.1* 8.4 8.1* 8.2*  MG  --   --   --   --  1.9   Liver Function Tests: No results found for this basename: AST, ALT, ALKPHOS, BILITOT, PROT, ALBUMIN,  in the last 168 hours  Recent Labs Lab 12/09/12 1711  LIPASE 13  AMYLASE 30   No results found for this basename: AMMONIA,  in the last 168 hours CBC:  Recent Labs Lab 12/11/12 0416 12/12/12 0408 12/13/12 0415 12/15/12 0450 12/16/12 0519  WBC 17.1* 16.3* 13.9* 11.3* 12.6*  HGB 8.0* 8.6* 8.8* 7.1* 6.8*  HCT 25.4* 26.7* 28.2* 22.4* 20.7*  MCV 74.3* 75.0* 75.0* 74.9* 74.7*  PLT 479* 570* 565* 526* 552*   Cardiac Enzymes: No results found for this basename: CKTOTAL, CKMB, CKMBINDEX, TROPONINI,  in the last 168 hours BNP: No components found with this basename: POCBNP,  CBG:  Recent Labs Lab 12/15/12 1233 12/15/12 1832 12/15/12 2204 12/16/12 0758 12/16/12 1152  GLUCAP 176* 165* 104* 138* 102*    Recent Results (from the past 240 hour(s))  CULTURE, BLOOD (ROUTINE X 2)     Status: None   Collection Time    12/07/12 10:00 AM      Result Value Range Status   Specimen Description BLOOD LEFT FOREARM   Final   Special Requests BOTTLES DRAWN AEROBIC ONLY   Final   Culture  Setup Time 12/07/2012 13:45   Final   Culture NO GROWTH 5 DAYS   Final   Report Status 12/13/2012 FINAL   Final  URINE CULTURE     Status: None   Collection Time    12/07/12 10:40 AM      Result Value Range Status   Specimen Description URINE, CLEAN CATCH   Final   Special Requests NONE   Final   Culture  Setup Time 12/07/2012 13:48   Final   Colony Count NO GROWTH   Final   Culture NO GROWTH   Final   Report Status 12/08/2012 FINAL   Final  CULTURE, BLOOD (ROUTINE X 2)     Status: None   Collection Time    12/07/12 10:50 AM      Result Value Range Status   Specimen Description BLOOD EFT FOREARM   Final   Special Requests BOTTLES DRAWN AEROBIC ONLY 5CC   Final   Culture  Setup Time 12/07/2012 13:45   Final   Culture NO GROWTH 5 DAYS    Final   Report Status 12/13/2012 FINAL   Final  MRSA PCR SCREENING     Status: None   Collection Time  12/08/12  4:33 AM      Result Value Range Status   MRSA by PCR NEGATIVE  NEGATIVE Final   Comment:            The GeneXpert MRSA Assay (FDA     approved for NASAL specimens     only), is one component of a     comprehensive MRSA colonization     surveillance program. It is not     intended to diagnose MRSA     infection nor to guide or     monitor treatment for     MRSA infections.  WOUND CULTURE     Status: None   Collection Time    12/09/12  4:31 PM      Result Value Range Status   Specimen Description FOOT   Final   Special Requests Normal   Final   Gram Stain     Final   Value: NO WBC SEEN     RARE SQUAMOUS EPITHELIAL CELLS PRESENT     RARE GRAM POSITIVE COCCI IN PAIRS   Culture MODERATE STENOTROPHOMONAS MALTOPHILIA   Final   Report Status 12/12/2012 FINAL   Final   Organism ID, Bacteria STENOTROPHOMONAS MALTOPHILIA   Final     Scheduled Meds: . amLODipine  10 mg Oral Daily  . aspirin EC  162 mg Oral Daily  . bisacodyl  10 mg Oral q12n4p  . collagenase  1 application Topical q morning - 10a  . docusate sodium  100 mg Oral BID  . enoxaparin (LOVENOX) injection  40 mg Subcutaneous Q24H  . ferrous sulfate  325 mg Oral BID WC  . insulin aspart  0-15 Units Subcutaneous TID WC  . lisinopril  20 mg Oral Daily  . metoprolol tartrate  50 mg Oral BID  . multivitamin with minerals  1 tablet Oral Daily  . pantoprazole  40 mg Oral Daily  . patient's guide to using coumadin book   Does not apply Once  . polyethylene glycol  17 g Oral BID  . protein supplement  1 scoop Oral TID WC  . simvastatin  20 mg Oral q1800  . sodium chloride  3 mL Intravenous Q12H  . sodium phosphate  1 enema Rectal Once  . sulfamethoxazole-trimethoprim  320 mg Intravenous Q12H   Continuous Infusions: . sodium chloride 50 mL/hr at 12/15/12 1900  . lactated ringers       Nneoma Harral, DO  [facility] Pager [phone]  If 7PM-7AM, please contact night-coverage [email or web address] Password [facility] 12/16/2012, 4:26 PM   LOS: 9 days

## 2012-12-17 LAB — TYPE AND SCREEN
ABO/RH(D): O POS
Antibody Screen: NEGATIVE
Unit division: 0

## 2012-12-17 LAB — CBC
Hemoglobin: 9.8 g/dL — ABNORMAL LOW (ref 13.0–17.0)
MCH: 25.5 pg — ABNORMAL LOW (ref 26.0–34.0)
RBC: 3.85 MIL/uL — ABNORMAL LOW (ref 4.22–5.81)
WBC: 12.1 10*3/uL — ABNORMAL HIGH (ref 4.0–10.5)

## 2012-12-17 LAB — BASIC METABOLIC PANEL
GFR calc non Af Amer: 74 mL/min — ABNORMAL LOW (ref >90)
Glucose, Bld: 179 mg/dL — ABNORMAL HIGH (ref 70–99)
Potassium: 4 mEq/L (ref 3.5–5.1)
Sodium: 132 mEq/L — ABNORMAL LOW (ref 135–145)

## 2012-12-17 LAB — GLUCOSE, CAPILLARY
Glucose-Capillary: 157 mg/dL — ABNORMAL HIGH (ref 70–99)
Glucose-Capillary: 132 mg/dL — ABNORMAL HIGH (ref 70–99)

## 2012-12-17 MED ORDER — FERROUS SULFATE 325 (65 FE) MG PO TABS: 325.0000 mg | ORAL_TABLET | Freq: Two times a day (BID) | ORAL | Status: DC

## 2012-12-17 MED ORDER — LISINOPRIL 20 MG PO TABS: 20.0000 mg | ORAL_TABLET | Freq: Every day | ORAL | Status: DC

## 2012-12-17 MED ORDER — AMLODIPINE BESYLATE 10 MG PO TABS: 10.0000 mg | ORAL_TABLET | Freq: Every day | ORAL | Status: DC

## 2012-12-17 MED ORDER — METOPROLOL TARTRATE 50 MG PO TABS: 50.0000 mg | ORAL_TABLET | Freq: Two times a day (BID) | ORAL | Status: DC

## 2012-12-17 MED ORDER — HYDROCODONE-ACETAMINOPHEN 5-325 MG PO TABS: 1.0000 | ORAL_TABLET | ORAL | Status: DC | PRN

## 2012-12-17 MED ORDER — SULFAMETHOXAZOLE-TRIMETHOPRIM 400-80 MG/5ML IV SOLN: 320.0000 mg | Freq: Two times a day (BID) | INTRAVENOUS | Status: DC

## 2012-12-17 NOTE — Progress Notes (Signed)
Patient ID: Keith Guzman, male   DOB: [birth year], 52 y.o.   MRN: [id number] Subjective: 3 Days Post-Op Procedure(s) (LRB): AMPUTATION Left Mid Foot (Left) Awake, alert and Ox4. Not watching TV due to Sabbath. Patient reports pain as mild.    Objective:   VITALS:  Temp:  [98.6 F (37 C)-99.9 F (37.7 C)] 99 F (37.2 C) (03/22 0539) Pulse Rate:  [98-108] 100 (03/22 0539) Resp:  [18-20] 18 (03/22 0539) BP: (121-169)/(71-93) 144/80 mmHg (03/22 0539) SpO2:  [94 %-100 %] 95 % (03/22 0539)  Neurologically intact ABD soft Sensation intact distally Intact pulses distally Dorsiflexion/Plantar flexion intact   LABS  Recent Labs  12/15/12 0450 12/16/12 0519 12/17/12 0950  HGB 7.1* 6.8* 9.8*  WBC 11.3* 12.6* 12.1*  PLT 526* 552* 672*    Recent Labs  12/16/12 0519 12/17/12 0950  NA 132* 132*  K 4.1 4.0  CL 100 96  CO2 25 24  BUN 6 6  CREATININE 1.11 1.11  GLUCOSE 158* 179*    Recent Labs  12/16/12 0519 12/17/12 0500  INR 2.24* 1.75*     Assessment/Plan: 3 Days Post-Op Procedure(s) (LRB): AMPUTATION Left Mid Foot (Left)  Advance diet Up with therapy Continue ABX therapy due to Post-op infection Discharge home with home health  Katiana Ruland E 12/17/2012, 2:40 PM

## 2012-12-17 NOTE — Progress Notes (Signed)
[facility] PROGRESS NOTE  Keith Guzman ZOX:[id number] DOB: 07/22/1961 DOA: 12/07/2012 PCP: Laurell Josephs, MD  Assessment/Plan: Sepsis  -resolved -Continue TMP/SMZ IV (12/12/12>>)  - Blood cultures no growth,  -wound Cx :stenotrophomonas maltophilia  - MRI without foot abscess--phlegmon to cortical surface of left calcaneal lateral tuberosity  - fever curve down, WBC trend down  - ID Dr.Hatcher following  -Discussed with Dr. Cassie Freer Bactrim thru 12/26/12  -Home health for physical therapy and IV antibiotics was set up for the patient  L great toe: ischemia with necrotic ulcer: PVD, and possible osteomyelitis in lateral calcaneous tuberosity  -Discussed with case manager--> unfortunately will not have IV TMP/SMZ available by home health until 12/18/12 -plan d/c 12/18/12 - LSFA PTA 11/29/12 by Dr.Berry, followed by 2 vessel run off  -Dr. Allyson Sabal was consulted--no additional revascularization at this time  -Patient was treated with wound care and antibiotics, but there was no significant improvement of the patient's foot--> as a result Dr. Lajoyce Corners consulted  - given progression of the necrotic ulcer on IV abx, Dr. Lajoyce Corners was consulted for surgical options  - called d/w Dr.Thompson Ortho (3/18) who had seen the pt on admission,  - Appreciate Dr. Chestine Spore TMA on 12/14/12  - appreciate ID followup  - PICC placed 3/17  Nausea and vomiting  -Possibly related to underlying sepsis and infectious process superimposed upon suspected diabetic gastroparesis  -overall improved  -ate [employer] last night (3/19) and this am (3/20) without vomiting  -ate [employer] today (3/21)  Constipation  -improved, had BM 12/16/12  -partly due to opioids-->had BM 12/14/12  - increase miralax to BID, continue dulcolax and senokot  - KUB (3/16) unremarkable  - tolerating carb modified diet  -Patient intermittently refuses stool softeners  Uncontrolled Type 2 diabetes mellitus  -Hemoglobin A1C 6.8  -Discontinue  glipizide while in hospital due to the patient's renal insufficiency and intermittent n.p.o. status for procedures  -continue novolog SSI while inpatient  -Plan to resume glipizide at the time of discharge  Iron deficiency anemia  -Anemia panel suggestive of iron deficiency. No workup done in the past.  -Baseline hemoglobin around 9., s/p 2 units PRBC 3/13  -Needs GI workup as outpatient  -Given a dose of ferric gluconate  -start ferrous sulfate twice a day  -Transfused 2 units PRBCs (12/07/2012, 12/16/2012)--4 units total for the hospitalization  -Repeat hemoglobin 9.8 after transfusion on 12/16/2012 Deconditioning  -Home health PT was set up  Hypertension  -Continue amlodipine, metoprolol tartrate, and lisinopril  -Patient intermittently refusing medications including metoprolol tartrate resulting in intermittent tachycardia  Hypokalemia  -Repleted  -Check magnesium--1.9  Coagulopathy  -Due to warfarin dose March 19, discontinued warfarin--> improved after stopping warfarin Possible CAD:  -stress test per cards as outpatient  Family Communication: d/w pt at bedside  Antibiotics:  Vanc 3/12-3/17  Zosyn 3/12-3/17  TMP/SMZ IV 12/12/12>>> Consultants:  Dr.Duda, Ortho 3/18  Dr.Thompson, Ortho  Dr.Berry, [facility]  ID: Dr.Hatcher  Procedures:  TMA 12/14/12        Procedures/Studies: Dg Chest 2 View  12/07/2012  *RADIOLOGY REPORT*  Clinical Data: Vomiting, nausea and abdominal pain.  CHEST - 2 VIEW  Comparison: 11/16/2012.  Findings: Trachea is midline.  Heart size is accentuated by somewhat low lung volumes and semi upright technique.  Probable scarring in the left suprahilar region, superimposed with osseous bridging between the left first and second anterior ribs.  Lungs are otherwise clear.  No pleural fluid.  IMPRESSION: No acute findings.   Original Report  Authenticated By: Leanna Battles, M.D.    Dg Abd 1 View  12/11/2012  *RADIOLOGY REPORT*  Clinical Data: Abdominal pain.   Abdominal distention. Constipation.  ABDOMEN - 1 VIEW  Comparison: 12/07/2012  Findings: Gas pattern is normal without evidence of ileus, obstruction or apparent free air.  There is a normal amount of fecal matter in the colon.  No abnormal calcifications or acute bony findings.  IMPRESSION: Gas pattern and fecal burden within normal limits.   Original Report Authenticated By: Paulina Fusi, M.D.    Dg Abd 1 View  12/07/2012  *RADIOLOGY REPORT*  Clinical Data: Mid abdominal pain.  Constipation.  ABDOMEN - 1 VIEW  Comparison: None.  Findings: No free intraperitoneal air is identified.  Bowel gas pattern is normal.  No abnormal abdominal calcification or focal bony abnormality.  There is some right hip degenerative disease.  IMPRESSION: No acute finding.   Original Report Authenticated By: Holley Dexter, M.D.    Mr Foot Left W Wo Contrast  12/07/2012  *RADIOLOGY REPORT*  Clinical Data: Osteomyelitis.  Fever.  Sepsis.  Diabetic foot. Foot ulceration.  MRI OF THE LEFT FOREFOOT WITHOUT AND WITH CONTRAST,MRI OF THE LEFT ANKLE WITHOUT AND WITH CONTRAST  Technique:  Multiplanar, multisequence MR imaging was performed both before and after administration of intravenous contrast.  Contrast: 15mL MULTIHANCE GADOBENATE DIMEGLUMINE 529 MG/ML IV SOLN  Comparison: None.  Findings: Motion artifact is present on the examination.  Diabetic myositis is present in the plantar foot musculature.  Diffuse subcutaneous edema and enhancement compatible with cellulitis.  There is no soft tissue abscess identified.  There is a dorsal forefoot ulceration overlying the proximal phalanx of the great toe however this is not extend to the bony surface.  There is a blister along the lateral aspect of the great toe.  No soft tissue abscess is present.  Small effusion of the first MTP joint.  Marginal erosions are present in the first MTP, suggesting gout arthritis over pressure erosion.  Correlation with serum uric acid is recommended.   IMPRESSION: Negative for osteomyelitis of the forefoot.  Dorsal forefoot ulceration overlying the proximal phalanx of the great toe and blistering along the lateral aspect of the great toe.  No soft tissue abscess.  MRI LEFT ANKLE WITH AND WITHOUT CONTRAST  Technique:  Multiplanar, multisequence MR imaging of the left ankle was performed before and after the administration of intravenous contrast.  Contrast: 15mL MULTIHANCE GADOBENATE DIMEGLUMINE 529 MG/ML IV SOLN  Comparison:  10/16/2012.  Findings: Diffuse subcutaneous edema is present which may represent dependent edema or cellulitis in the appropriate clinical setting.  Diabetic myopathy is present with some fatty atrophy of the plantar foot musculature.  There is a new ulcer along the lateral aspects of the left calcaneal tuberosity with phlegmon tracking to the cortical surface and osteomyelitis of the lateral calcaneal tuberosity (image number 20 series 17).  These marrow changes are new compared to the prior exam of 10/16/2012 and the ulcer appears new as well.  Midfoot appears within normal limits without evidence of erosions.  The flexor and extensor tendons appear within normal limits.  Talus appears normal.  Medial and lateral malleolus and the tibial plafond are within normal limits.  Small ankle and subtalar effusions are likely reactive.  IMPRESSION: New small area of osteomyelitis of the lateral calcaneal tuberosity with posterior lateral heel ulcer tracking to the focus of osteomyelitis.  No abscess.   Original Report Authenticated By: Andreas Newport, M.D.    Mr Ankle  Left W Wo Contrast  12/07/2012  *RADIOLOGY REPORT*  Clinical Data: Osteomyelitis.  Fever.  Sepsis.  Diabetic foot. Foot ulceration.  MRI OF THE LEFT FOREFOOT WITHOUT AND WITH CONTRAST,MRI OF THE LEFT ANKLE WITHOUT AND WITH CONTRAST  Technique:  Multiplanar, multisequence MR imaging was performed both before and after administration of intravenous contrast.  Contrast: 15mL MULTIHANCE  GADOBENATE DIMEGLUMINE 529 MG/ML IV SOLN  Comparison: None.  Findings: Motion artifact is present on the examination.  Diabetic myositis is present in the plantar foot musculature.  Diffuse subcutaneous edema and enhancement compatible with cellulitis.  There is no soft tissue abscess identified.  There is a dorsal forefoot ulceration overlying the proximal phalanx of the great toe however this is not extend to the bony surface.  There is a blister along the lateral aspect of the great toe.  No soft tissue abscess is present.  Small effusion of the first MTP joint.  Marginal erosions are present in the first MTP, suggesting gout arthritis over pressure erosion.  Correlation with serum uric acid is recommended.  IMPRESSION: Negative for osteomyelitis of the forefoot.  Dorsal forefoot ulceration overlying the proximal phalanx of the great toe and blistering along the lateral aspect of the great toe.  No soft tissue abscess.  MRI LEFT ANKLE WITH AND WITHOUT CONTRAST  Technique:  Multiplanar, multisequence MR imaging of the left ankle was performed before and after the administration of intravenous contrast.  Contrast: 15mL MULTIHANCE GADOBENATE DIMEGLUMINE 529 MG/ML IV SOLN  Comparison:  10/16/2012.  Findings: Diffuse subcutaneous edema is present which may represent dependent edema or cellulitis in the appropriate clinical setting.  Diabetic myopathy is present with some fatty atrophy of the plantar foot musculature.  There is a new ulcer along the lateral aspects of the left calcaneal tuberosity with phlegmon tracking to the cortical surface and osteomyelitis of the lateral calcaneal tuberosity (image number 20 series 17).  These marrow changes are new compared to the prior exam of 10/16/2012 and the ulcer appears new as well.  Midfoot appears within normal limits without evidence of erosions.  The flexor and extensor tendons appear within normal limits.  Talus appears normal.  Medial and lateral malleolus and the  tibial plafond are within normal limits.  Small ankle and subtalar effusions are likely reactive.  IMPRESSION: New small area of osteomyelitis of the lateral calcaneal tuberosity with posterior lateral heel ulcer tracking to the focus of osteomyelitis.  No abscess.   Original Report Authenticated By: Andreas Newport, M.D.          Subjective: Patient denies fevers, chills, chest pain, short of breath, vomiting, diarrhea, abdominal pain. He gets nauseous with taking multiple medications at the same time. Denies any abdominal pain. Overall the pain is controlled.  Objective: Filed Vitals:   12/16/12 1902 12/16/12 1924 12/16/12 2130 12/17/12 0539  BP: 137/83 169/89 151/93 144/80  Pulse: 98 108 104 100  Temp: 99 F (37.2 C) 98.6 F (37 C) 99.2 F (37.3 C) 99 F (37.2 C)  TempSrc: Oral Oral Oral Oral  Resp: 20 20 20 18   Height:      Weight:      SpO2: 94%  95% 95%    Intake/Output Summary (Last 24 hours) at 12/17/12 1256 Last data filed at 12/17/12 0600  Gross per 24 hour  Intake  452.5 ml  Output    150 ml  Net  302.5 ml   Weight change:  Exam:   General:  Pt is alert, follows commands  appropriately, not in acute distress  HEENT: No icterus, No thrush, [place]/AT  Cardiovascular: RRR, S1/S2, no rubs, no gallops  Respiratory: CTA bilaterally, no wheezing, no crackles, no rhonchi  Abdomen: Soft/+BS, non tender, non distended, no guarding  Extremities: Left foot is bandaged without any lymphangitis, crepitance, edema. Right lower extremity without any lymphangitis, crepitance, rashes  Data Reviewed: Basic Metabolic Panel:  Recent Labs Lab 12/13/12 0415 12/14/12 1155 12/15/12 0450 12/16/12 0519 12/17/12 0950  NA 137 136 136 132* 132*  K 3.1* 3.3* 3.4* 4.1 4.0  CL 100 100 101 100 96  CO2 28 27 26 25 24   GLUCOSE 125* 130* 182* 158* 179*  BUN 9 6 6 6 6   CREATININE 1.11 1.04 1.09 1.11 1.11  CALCIUM 8.1* 8.4 8.1* 8.2* 8.8  MG  --   --   --  1.9  --    Liver  Function Tests: No results found for this basename: AST, ALT, ALKPHOS, BILITOT, PROT, ALBUMIN,  in the last 168 hours No results found for this basename: LIPASE, AMYLASE,  in the last 168 hours No results found for this basename: AMMONIA,  in the last 168 hours CBC:  Recent Labs Lab 12/12/12 0408 12/13/12 0415 12/15/12 0450 12/16/12 0519 12/17/12 0950  WBC 16.3* 13.9* 11.3* 12.6* 12.1*  HGB 8.6* 8.8* 7.1* 6.8* 9.8*  HCT 26.7* 28.2* 22.4* 20.7* 29.8*  MCV 75.0* 75.0* 74.9* 74.7* 77.4*  PLT 570* 565* 526* 552* 672*   Cardiac Enzymes: No results found for this basename: CKTOTAL, CKMB, CKMBINDEX, TROPONINI,  in the last 168 hours BNP: No components found with this basename: POCBNP,  CBG:  Recent Labs Lab 12/16/12 1152 12/16/12 1634 12/16/12 2123 12/17/12 0724 12/17/12 1140  GLUCAP 102* 177* 167* 134* 157*    Recent Results (from the past 240 hour(s))  MRSA PCR SCREENING     Status: None   Collection Time    12/08/12  4:33 AM      Result Value Range Status   MRSA by PCR NEGATIVE  NEGATIVE Final   Comment:            The GeneXpert MRSA Assay (FDA     approved for NASAL specimens     only), is one component of a     comprehensive MRSA colonization     surveillance program. It is not     intended to diagnose MRSA     infection nor to guide or     monitor treatment for     MRSA infections.  WOUND CULTURE     Status: None   Collection Time    12/09/12  4:31 PM      Result Value Range Status   Specimen Description FOOT   Final   Special Requests Normal   Final   Gram Stain     Final   Value: NO WBC SEEN     RARE SQUAMOUS EPITHELIAL CELLS PRESENT     RARE GRAM POSITIVE COCCI IN PAIRS   Culture MODERATE STENOTROPHOMONAS MALTOPHILIA   Final   Report Status 12/12/2012 FINAL   Final   Organism ID, Bacteria STENOTROPHOMONAS MALTOPHILIA   Final     Scheduled Meds: . amLODipine  10 mg Oral Daily  . aspirin EC  162 mg Oral Daily  . collagenase  1 application Topical q  morning - 10a  . docusate sodium  100 mg Oral BID  . enoxaparin (LOVENOX) injection  40 mg Subcutaneous Q24H  . ferrous sulfate  325 mg  Oral BID WC  . insulin aspart  0-15 Units Subcutaneous TID WC  . lisinopril  20 mg Oral Daily  . metoprolol tartrate  50 mg Oral BID  . multivitamin with minerals  1 tablet Oral Daily  . pantoprazole  40 mg Oral Daily  . polyethylene glycol  17 g Oral BID  . protein supplement  1 scoop Oral TID WC  . simvastatin  20 mg Oral q1800  . sodium chloride  3 mL Intravenous Q12H  . sodium phosphate  1 enema Rectal Once  . sulfamethoxazole-trimethoprim  320 mg Intravenous Q12H   Continuous Infusions: . sodium chloride 50 mL/hr at 12/15/12 1900  . lactated ringers       Lexine Jaspers, DO  [facility] Pager [phone]  If 7PM-7AM, please contact night-coverage [email or web address] Password [facility] 12/17/2012, 12:56 PM   LOS: 10 days

## 2012-12-17 NOTE — Progress Notes (Signed)
Physical Therapy Treatment Patient Details Name: Keith Guzman MRN: [id number] DOB: 1961-07-14 Today's Date: 12/17/2012 Time: 4098-1191 PT Time Calculation (min): 18 min  PT Assessment / Plan / Recommendation Comments on Treatment Session  Pt doing well with RW, but he wants to wear prevolon boot for walking.  Discussed with RN who will suggest to MD other type of protective footwear for pt (he has had a darco shoe in the past but does not know where it is) pt hopeful to go home tomorrow    Follow Up Recommendations  Home health PT     Does the patient have the potential to tolerate intense rehabilitation     Barriers to Discharge        Equipment Recommendations  Rolling walker with 5" wheels    Recommendations for Other Services    Frequency Min 3X/week   Plan Discharge plan remains appropriate    Precautions / Restrictions Precautions Other Brace/Splint: Pt wants to keep prevalon boot on left foot even though I explained to him and talked with nurse that prevalon was to be worn in bed for pressure relief and positioning. RN will talk with MD about other type fo protective shoe for patient Restrictions Weight Bearing Restrictions: Yes LLE Weight Bearing: Non weight bearing   Pertinent Vitals/Pain No c/o pain    Mobility  Bed Mobility Bed Mobility: Supine to Sit;Sit to Supine Supine to Sit: 7: Independent Sit to Supine: 7: Independent Transfers Transfers: Sit to Stand;Stand to Sit Sit to Stand: 6: Modified independent (Device/Increase time) Stand to Sit: 6: Modified independent (Device/Increase time) Ambulation/Gait Ambulation/Gait Assistance: 4: Min assist Ambulation Distance (Feet): 75 Feet (on standing rest break) Assistive device: Rolling walker Ambulation/Gait Assistance Details: pt wanted to keep prevelon boot on foot and used it to slide foot on floor. He wanted to progress distance and leaned against the wall for a standing rest break.  cues for trunk  extension Gait Pattern: Step-to pattern Gait velocity: decreased General Gait Details: pt with good stability with RW.  Stairs: No Wheelchair Mobility Wheelchair Mobility: No (suggested pt consider W/C for distance locomotion)    Exercises Other Exercises Other Exercises: standing isometrics   PT Diagnosis:    PT Problem List:   PT Treatment Interventions:     PT Goals Acute Rehab PT Goals PT Goal Formulation: With patient Time For Goal Achievement: 12/30/12 Potential to Achieve Goals: Good Pt will go Sit to Stand: with supervision PT Goal: Sit to Stand - Progress: Met Pt will Ambulate: 51 - 150 feet;with supervision;with rolling walker PT Goal: Ambulate - Progress: Progressing toward goal  Visit Information  Last PT Received On: 12/17/12 Assistance Needed: +1    Subjective Data  Subjective: I gotta get this bad boy to heal up Patient Stated Goal: to go home tomorrow   Cognition       Balance  Balance Balance Assessed: Yes Static Sitting Balance Static Sitting - Balance Support: No upper extremity supported Static Sitting - Level of Assistance: 7: Independent Static Standing Balance Static Standing - Balance Support: Bilateral upper extremity supported;During functional activity Static Standing - Level of Assistance: 6: Modified independent (Device/Increase time) Static Standing - Comment/# of Minutes: NWB  End of Session PT - End of Session Equipment Utilized During Treatment: Gait belt Activity Tolerance: Patient limited by fatigue Patient left: with call bell/phone within reach;in bed Nurse Communication: Mobility status   GP    [name]. [place], [place] [phone] 12/17/2012, 4:09 PM

## 2012-12-17 NOTE — Care Management (Addendum)
Cm spoke with Baxter Hire, pharmacist at [facility] at [phone] to confirm agency ability to provide pt with IV ABX upon discharge in 12/18/12. MD order, RX, & facesheet faxed to [facility] at [phone]. Confirmation received. Pt to receive am dose at hospital, [facility] to provide medication for 6 pm dose in which [facility] will provide [facility] to administer medication. On-site [place] rep Gerrit Friends [phone] provided with copy of rx, Md orders, & facesheet for agency to provide [facility]. Agency followed pt duration of hospital stay. No other needs stated. Dr.Tat notified by CM of pt's confirmed discharge plan. Pt request RW. MD order entered. [facility] notified of DME referral. Delivery scheduled to room prior to discharge.   [name] [phone]

## 2012-12-18 LAB — BASIC METABOLIC PANEL
GFR calc Af Amer: 81 mL/min — ABNORMAL LOW (ref >90)
GFR calc non Af Amer: 70 mL/min — ABNORMAL LOW (ref >90)
Glucose, Bld: 183 mg/dL — ABNORMAL HIGH (ref 70–99)
Potassium: 4.3 mEq/L (ref 3.5–5.1)
Sodium: 131 mEq/L — ABNORMAL LOW (ref 135–145)

## 2012-12-18 LAB — GLUCOSE, CAPILLARY
Glucose-Capillary: 140 mg/dL — ABNORMAL HIGH (ref 70–99)
Glucose-Capillary: 217 mg/dL — ABNORMAL HIGH (ref 70–99)

## 2012-12-18 LAB — PROTIME-INR
INR: 1.43 (ref 0.00–1.49)
Prothrombin Time: 17.1 seconds — ABNORMAL HIGH (ref 11.6–15.2)

## 2012-12-18 NOTE — Progress Notes (Addendum)
Patient ID: Keith Guzman, male   DOB: 1961/06/16, 52 y.o.   MRN: [id number] Subjective: 4 Days Post-Op Procedure(s) (LRB): AMPUTATION Left Mid Foot (Left) Awake, alert and Ox4. No complaints, when can I go home? Patient reports pain as mild.    Objective:   VITALS:  Temp:  [98.4 F (36.9 C)-99.3 F (37.4 C)] 98.6 F (37 C) (03/23 0600) Pulse Rate:  [94-105] 94 (03/23 0600) Resp:  [18] 18 (03/23 0600) BP: (143-153)/(83-88) 143/83 mmHg (03/23 0600) SpO2:  [94 %-98 %] 94 % (03/23 0600)  Neurologically intact ABD soft Neurovascular intact Sensation intact distally Incision: dressing C/D/I and scant drainage   LABS  Recent Labs  12/16/12 0519 12/17/12 0950  HGB 6.8* 9.8*  WBC 12.6* 12.1*  PLT 552* 672*    Recent Labs  12/17/12 0950 12/18/12 0500  NA 132* 131*  K 4.0 4.3  CL 96 97  CO2 24 25  BUN 6 8  CREATININE 1.11 1.17  GLUCOSE 179* 183*    Recent Labs  12/17/12 0500 12/18/12 0500  INR 1.75* 1.43     Assessment/Plan: 4 Days Post-Op Procedure(s) (LRB): AMPUTATION Left Mid Foot (Left)  Advance diet Up with therapy Discharge home with home health IV antibiotics for control of cellulitis. Stable from Ortho stand point to D/C With [facility] for continued IV antibiotic therapy. Should remain non weight bearing until seen by Dr. Lajoyce Corners in follow up. Aspirin for anti DVT prophylaxis.  Valeta Paz E 12/18/2012, 9:48 AM

## 2012-12-18 NOTE — Care Management Note (Signed)
Page 1 of 2   12/18/2012     4:06:37 PM   CARE MANAGEMENT NOTE 12/18/2012  Patient:  Keith Guzman, Keith Guzman   Account Number:  [id number]  Date Initiated:  12/08/2012  Documentation initiated by:  Keith Guzman  Subjective/Objective Assessment:   sepsis versus osteomylitis vs cellulitis     Action/Plan:   home   Anticipated DC Date:  12/18/2012   Anticipated DC Plan:  HOME W HOME HEALTH SERVICES  In-house referral  NA      DC Planning Services  CM consult      [facility] Choice  HOME HEALTH   Choice offered to / List presented to:  C-1 Patient   DME arranged  [facility]      DME agency  NA     HH arranged  HH-1 RN  HH-2 PT      HH agency  [facility]   Status of service:  Completed, signed off Medicare Important Message given?  NA - LOS <3 / Initial given by admissions (If response is "NO", the following Medicare IM given date fields will be blank) Date Medicare IM given:   Date Additional Medicare IM given:    Discharge Disposition:  HOME W HOME HEALTH SERVICES  Per UR Regulation:  Reviewed for med. necessity/level of care/duration of stay  If discussed at Long Length of Stay Meetings, dates discussed:   12/15/2012    Comments:  12/18/12 Keith Fuston RN,BSN NCM [phone] FAXED/W CONFIRMATION TO [facility] #[phone] IV MED- SULFAMETHOXAZOLE-TRIMETHOPRIM TO BE DELIVERED TO PATIENT'S HOME.TC DONNA [facility] REP TEL#[phone] INFORMED OF D/C HOME W/HHRN/PT ORDER,FAXED TO [facility] FAX#[phone] W/CONFIRMATION.  12/17/12 1811 Keith Guzman [phone] Cm spoke with Baxter Hire, pharmacist at [facility] at [phone] to confirm agency ability to provide pt with IV ABX upon discharge in 12/18/12. MD order, RX, & facesheet faxed to [facility] at [phone]. Confirmation received. Pt to receive am dose at hospital, [facility] to provide medication for 6 pm dose in which [facility] will provide [facility] to administer medication. On-site [place] rep Keith Guzman [phone] provided  with copy of rx, Md orders, & facesheet for agency to provide [facility]. Agency followed pt duration of hospital stay. No other needs stated. Dr.Tat notified by CM of pt's confirmed discharge plan. Pt request Rw. MD order entered. Delivery scheduled to room prior to discharge.  12/16/2012 Keith Guzman BSN RN Guzman [phone] Patient is post op mid foot amputation 12/14/2012. Plans are for patient to return to his home where spouse will be caregiver.  Per Dt Tat, patient will need home antibiotics x 10 days, HHRN, PT Prescription for bactrim home antibiotic sent to [facility] infusion-fx-[phone]. Advised [facility] rep-mary that patient will require HHpt/rn upon discharge. Orders for services faxed to [phone]- CM called [facility] infusion [phone] regarding IV prescription and expected discharge date; spoke with stephanie; she has received prescription but will need H&p and lab results; requested information ws sent to fax-[phone]. confirmation received.  lOCATION OF PICC LINE -LEFT BRACHIAL, SINGLE LUMEN-5 FRENCH, 42 CM LONG-INFO GIVEN TO [facility] pharmacist at request Received call from [facility] at [phone] that they currently do not have bactrim-IV abx available; will not have til Monday03/24/2014. [facility] ADVISED.  WILL ADVISE SAT-CM  OF THE ABOVE . ATTENDING MD-Tat NOTIFIED OF THE ABOVE. Cm will con't search for infusion company that has abx.   12/10/12 1214 Keith Guzman [phone] Cm spoke with patient with spouse Keith Guzman present at bedside. Per MD CM consult  pt will require HHRN for home IV ABX. PT eval suggest HHPT upon discharge. Pt offered list for [facility]. No choice made at this time. Benefits check submitted for choice of HH agencies within patient's insurance network. Unit CM to follow up during the week. Pt request RW as suggested by PT eval.  [id number] Keith Guzman:  CHART REVIEWED AND UPDATED.  Next chart review due on [id number]. NO DISCHARGE NEEDS  PRESENT AT THIS TIME. CASE MANAGEMENT [phone]

## 2012-12-18 NOTE — Discharge Summary (Addendum)
Physician Discharge Summary  Keith Guzman ZOX:[id number] DOB: 04-Sep-1961 DOA: 12/07/2012  PCP: Laurell Josephs, MD  Admit date: 12/07/2012 Discharge date: 12/18/2012  Recommendations for Outpatient Follow-up:  1. Pt will need to follow up with PCP in 2 weeks post discharge 2. Please obtain BMP to evaluate electrolytes and kidney function 3. Please also check CBC to evaluate Hg and Hct levels 4. Follow up Dr. Lajoyce Corners in 2 weeks 5. Followup with Dr. Ninetta Lights in 2 weeks 6. IV bactrim q 12hrs, last day on 12/26/12  Discharge Diagnoses:  Principal Problem:   Sepsis, gangrene Lt great toe Active Problems:   Type II or unspecified type diabetes mellitus with unspecified complication, uncontrolled   ANEMIA, microcytic- Hgb down to 6.7 12/07/12- transfused   PVD, LSFA PTA 11/29/12   Non-healing ulcer of foot   HTN (hypertension), poor control   History of smoking. quit 2005   Sinus tachycardia   Skin ulcer of left lower leg   Osteomyelitis of ankle, left foot and ankle- MRI 12/07/12   Constipation Sepsis  -Patient was febrile and tachycardic at the time of admission - Ischemic necrotic ulcer at base of great toe/DM foot infection  - completed 5 days of IV vancomycin and Zosyn, then transitioned to IV bactrim per ID  -Continue TMP/SMZ IV (12/12/12>>)  - Blood cultures no growth,  -wound Cx :stenotrophomonas maltophilia  - MRI without foot abscess--phlegmon to cortical surface of left calcaneal lateral tuberosity  - fever curve down, WBC trend down  - ID Dr.Hatcher following  -Discussed with Dr. Cassie Freer Bactrim thru 12/26/12  -Home health for physical therapy and IV antibiotics was set up for the patient -PICC line to be removed 12/27/12 L great toe: ischemia with necrotic ulcer: PVD, and possible osteomyelitis in lateral calcaneous tuberosity  - LSFA PTA 11/29/12 by Dr.Berry, followed by 2 vessel run off -Dr. Allyson Sabal was consulted--no additional revascularization at this time -Patient was treated  with wound care and antibiotics, but there was no significant improvement of the patient's foot--> as a result Dr. Lajoyce Corners consulted  - given progression of the necrotic ulcer on IV abx, Dr. Lajoyce Corners was consulted for surgical options  - called d/w Dr.Thompson Ortho  (3/18) who had seen the pt on admission,  - Appreciate Dr. Chestine Spore TMA on 12/14/12  - appreciate ID followup  - PICC placed 3/17  Nausea and vomiting  -Possibly related to underlying sepsis and infectious process superimposed upon suspected diabetic gastroparesis  -overall improved  -ate subway last night (3/19) and this am (3/20) without vomiting  -ate [employer] today (3/21)  -resolved, tolerating diet at time of discharge Constipation  -improved, had BM 12/16/12  -partly due to opioids-->had BM 12/14/12  - increase miralax to BID, continue dulcolax and senokot  - KUB (3/16) unremarkable  - tolerating carb modified diet -Patient intermittently refuses stool softeners  Uncontrolled Type 2 diabetes mellitus  -Hemoglobin A1C 6.8  -Discontinue glipizide while in-patient due to the patient's renal insufficiency and intermittent n.p.o. status for procedures  -continue novolog SSI while inpatient  -Plan to resume glipizide and metformin at the time of discharge  Iron deficiency anemia  -Anemia panel suggestive of iron deficiency. No workup done in the past.  -Baseline hemoglobin around 9., s/p 2 units PRBC 3/13  -Needs GI workup as outpatient  -Given a dose of ferric gluconate during hospitalization -start ferrous sulfate twice a day  -Transfused 2 units PRBCs (12/07/2012 and 12/16/2012)--4 units total for the hospitalization  -Repeat hemoglobin on  12/17/2012--9.8 -Serum B12--1846, serum folate 14.7 Deconditioning  -Home health PT was set up  Hypertension  -Continue amlodipine, metoprolol tartrate, and lisinopril  -Patient intermittently refusing medications including metoprolol tartrate resulting in intermittent  tachycardia Hypokalemia  -Repleted  -Check magnesium--1.9  Coagulopathy  -Due to warfarin dose March 19, discontinued warfarin--> improved  Possible CAD:  -stress test per cards as outpatient  Family Communication: d/w pt at bedside   Antibiotics:  Vanc 3/12-3/17  Zosyn 3/12-3/17  TMP/SMZ IV 12/12/12>>> Consultants:  Dr.Duda, Ortho 3/18  Dr.Thompson, Ortho  Dr.Berry, [facility]  ID: Dr.Hatcher  Procedures:  TMA 12/14/12   Discharge Condition: stable  Disposition:  Follow-up Information   Follow up with DUDA,MARCUS V, MD In 2 weeks.   Contact information:   [address] [place] [address] [phone]       Follow up with [name], MD In 2 weeks.   Contact information:   [address]  [address] [place] [address] [phone]       Follow up with Laurell Josephs, MD In 2 weeks.   Contact information:   [address] [place] [address] [phone]       Diet:carbhohydrate Wt Readings from Last 3 Encounters:  12/08/12 68.1 kg (150 lb 2.1 oz)  12/08/12 68.1 kg (150 lb 2.1 oz)  12/08/12 68.1 kg (150 lb 2.1 oz)    History of present illness:  52 year old male with history of uncontrolled type 2 diabetes mellitus (hemoglobin A1c of 11.92 months back), nonhealing ulcer on the left heel for which she follows at [facility], admitted 2 months back for infection of the left heel ulcer, recent PTCA for limb salvage of critical ischemia of the left foot presented to the wound care center for subjective fevers, chills, nausea and vomiting for past 2 days. Patient  noticed an ulcer over the dorsum of the foot underneath the great toe 3 days prior to admit with foul smelling discharge. He also noted some bleeding from the chronic left heel wound.  Patient went to the wound care center and a packing was done around the new left dorsum ulcer as it was foul smelling as well. Patient was then sent to the ED.  Course in  the ED: In the ED he was noted to be tachycardic with low grade temperature of 99.3. Also complaining of nausea and vomiting with epigastric discomfort. With concern for infected foot ulcer he was given a single dose of IV vancomycin and Zosyn. Blood work sent showed leukocytosis with a white count of 17,000. Also noted for drop in hemoglobin to 7.7. A UA obtained was unremarkable. Abdominal x-ray was negative for for acute findings. Patient given 1 L IV normal saline bolus and [facility] hospitalist called to admit patient.    Consultants: Ortho, Dr. Lajoyce Corners ID, Dr. Ninetta Lights  Discharge Exam: Filed Vitals:   12/18/12 0600  BP: 143/83  Pulse: 94  Temp: 98.6 F (37 C)  Resp: 18   Filed Vitals:   12/17/12 0539 12/17/12 1457 12/17/12 2131 12/18/12 0600  BP: 144/80 145/88 153/87 143/83  Pulse: 100 94 105 94  Temp: 99 F (37.2 C) 98.4 F (36.9 C) 99.3 F (37.4 C) 98.6 F (37 C)  TempSrc: Oral Oral Oral Oral  Resp: 18 18 18 18   Height:      Weight:      SpO2: 95% 95% 98% 94%   General: A&O x 3, NAD, pleasant, cooperative Cardiovascular: RRR,  no rub, no gallop, no S3 Respiratory: CTAB, no wheeze, no rhonchi Abdomen:soft, nontender, nondistended, positive bowel sounds Extremities: trace edema, No lymphangitis, no petechiae; left the distress with bulky dressing without any streaking lymphangitis or active bleeding  Discharge Instructions      Discharge Orders   Future Appointments Provider Department Dept Phone   12/22/2012 1:30 PM [facility] [place] [facility] [address] [phone]   Future Orders Complete By Expires     Non weight bearing  As directed     Scheduling Instructions:      No weight bearing left foot.        Medication List    STOP taking these medications       collagenase ointment  Commonly known as:  SANTYL      TAKE these medications       acetaminophen 500 MG tablet  Commonly known as:  TYLENOL  Take 500 mg by mouth every 6  (six) hours as needed for pain.     amLODipine 10 MG tablet  Commonly known as:  NORVASC  Take 1 tablet (10 mg total) by mouth daily.     aspirin 162 MG EC tablet  Take 1 tablet (162 mg total) by mouth daily.     dextrose 5 % SOLN 500 mL with sulfamethoxazole-trimethoprim 400-80 MG/5ML SOLN 320 mg  Inject 320 mg into the vein every 12 (twelve) hours. Last dose on 12/26/12     ferrous sulfate 325 (65 FE) MG tablet  Take 1 tablet (325 mg total) by mouth 2 (two) times daily with a meal.     glipiZIDE 10 MG tablet  Commonly known as:  GLUCOTROL  Take 1 tablet (10 mg total) by mouth 2 (two) times daily before a meal.     HYDROcodone-acetaminophen 5-325 MG per tablet  Commonly known as:  NORCO/VICODIN  Take 1-2 tablets by mouth every 4 (four) hours as needed.     lisinopril 20 MG tablet  Commonly known as:  PRINIVIL,ZESTRIL  Take 1 tablet (20 mg total) by mouth daily.     metFORMIN 500 MG tablet  Commonly known as:  GLUCOPHAGE  Take 1 tablet (500 mg total) by mouth 2 (two) times daily with a meal.     metoprolol 50 MG tablet  Commonly known as:  LOPRESSOR  Take 1 tablet (50 mg total) by mouth 2 (two) times daily.     multivitamin with minerals Tabs  Take 1 tablet by mouth daily.     pantoprazole 40 MG tablet  Commonly known as:  PROTONIX  Take 1 tablet (40 mg total) by mouth daily.     simvastatin 20 MG tablet  Commonly known as:  ZOCOR  Take 1 tablet (20 mg total) by mouth daily at 6 PM.     UNABLE TO FIND  Home Health Service to manage and service PICC line per their company protocol  Remove PICC line on 12/27/12     UNABLE TO FIND  Trimethoprim/sulfamethoxazole 320mg  IV q 12 hours  Last dose on 12/26/12         The results of significant diagnostics from this hospitalization (including imaging, microbiology, ancillary and laboratory) are listed below for reference.    Significant Diagnostic Studies: Dg Chest 2 View  12/07/2012  *RADIOLOGY REPORT*  Clinical  Data: Vomiting, nausea and abdominal pain.  CHEST - 2 VIEW  Comparison: 11/16/2012.  Findings: Trachea is midline.  Heart size is accentuated by somewhat low lung volumes and  semi upright technique.  Probable scarring in the left suprahilar region, superimposed with osseous bridging between the left first and second anterior ribs.  Lungs are otherwise clear.  No pleural fluid.  IMPRESSION: No acute findings.   Original Report Authenticated By: Leanna Battles, M.D.    Dg Abd 1 View  12/11/2012  *RADIOLOGY REPORT*  Clinical Data: Abdominal pain.  Abdominal distention. Constipation.  ABDOMEN - 1 VIEW  Comparison: 12/07/2012  Findings: Gas pattern is normal without evidence of ileus, obstruction or apparent free air.  There is a normal amount of fecal matter in the colon.  No abnormal calcifications or acute bony findings.  IMPRESSION: Gas pattern and fecal burden within normal limits.   Original Report Authenticated By: Paulina Fusi, M.D.    Dg Abd 1 View  12/07/2012  *RADIOLOGY REPORT*  Clinical Data: Mid abdominal pain.  Constipation.  ABDOMEN - 1 VIEW  Comparison: None.  Findings: No free intraperitoneal air is identified.  Bowel gas pattern is normal.  No abnormal abdominal calcification or focal bony abnormality.  There is some right hip degenerative disease.  IMPRESSION: No acute finding.   Original Report Authenticated By: Holley Dexter, M.D.    Mr Foot Left W Wo Contrast  12/07/2012  *RADIOLOGY REPORT*  Clinical Data: Osteomyelitis.  Fever.  Sepsis.  Diabetic foot. Foot ulceration.  MRI OF THE LEFT FOREFOOT WITHOUT AND WITH CONTRAST,MRI OF THE LEFT ANKLE WITHOUT AND WITH CONTRAST  Technique:  Multiplanar, multisequence MR imaging was performed both before and after administration of intravenous contrast.  Contrast: 15mL MULTIHANCE GADOBENATE DIMEGLUMINE 529 MG/ML IV SOLN  Comparison: None.  Findings: Motion artifact is present on the examination.  Diabetic myositis is present in the plantar foot  musculature.  Diffuse subcutaneous edema and enhancement compatible with cellulitis.  There is no soft tissue abscess identified.  There is a dorsal forefoot ulceration overlying the proximal phalanx of the great toe however this is not extend to the bony surface.  There is a blister along the lateral aspect of the great toe.  No soft tissue abscess is present.  Small effusion of the first MTP joint.  Marginal erosions are present in the first MTP, suggesting gout arthritis over pressure erosion.  Correlation with serum uric acid is recommended.  IMPRESSION: Negative for osteomyelitis of the forefoot.  Dorsal forefoot ulceration overlying the proximal phalanx of the great toe and blistering along the lateral aspect of the great toe.  No soft tissue abscess.  MRI LEFT ANKLE WITH AND WITHOUT CONTRAST  Technique:  Multiplanar, multisequence MR imaging of the left ankle was performed before and after the administration of intravenous contrast.  Contrast: 15mL MULTIHANCE GADOBENATE DIMEGLUMINE 529 MG/ML IV SOLN  Comparison:  10/16/2012.  Findings: Diffuse subcutaneous edema is present which may represent dependent edema or cellulitis in the appropriate clinical setting.  Diabetic myopathy is present with some fatty atrophy of the plantar foot musculature.  There is a new ulcer along the lateral aspects of the left calcaneal tuberosity with phlegmon tracking to the cortical surface and osteomyelitis of the lateral calcaneal tuberosity (image number 20 series 17).  These marrow changes are new compared to the prior exam of 10/16/2012 and the ulcer appears new as well.  Midfoot appears within normal limits without evidence of erosions.  The flexor and extensor tendons appear within normal limits.  Talus appears normal.  Medial and lateral malleolus and the tibial plafond are within normal limits.  Small ankle and subtalar effusions are likely reactive.  IMPRESSION: New small area of osteomyelitis of the lateral calcaneal  tuberosity with posterior lateral heel ulcer tracking to the focus of osteomyelitis.  No abscess.   Original Report Authenticated By: Andreas Newport, M.D.    Mr Ankle Left W Wo Contrast  12/07/2012  *RADIOLOGY REPORT*  Clinical Data: Osteomyelitis.  Fever.  Sepsis.  Diabetic foot. Foot ulceration.  MRI OF THE LEFT FOREFOOT WITHOUT AND WITH CONTRAST,MRI OF THE LEFT ANKLE WITHOUT AND WITH CONTRAST  Technique:  Multiplanar, multisequence MR imaging was performed both before and after administration of intravenous contrast.  Contrast: 15mL MULTIHANCE GADOBENATE DIMEGLUMINE 529 MG/ML IV SOLN  Comparison: None.  Findings: Motion artifact is present on the examination.  Diabetic myositis is present in the plantar foot musculature.  Diffuse subcutaneous edema and enhancement compatible with cellulitis.  There is no soft tissue abscess identified.  There is a dorsal forefoot ulceration overlying the proximal phalanx of the great toe however this is not extend to the bony surface.  There is a blister along the lateral aspect of the great toe.  No soft tissue abscess is present.  Small effusion of the first MTP joint.  Marginal erosions are present in the first MTP, suggesting gout arthritis over pressure erosion.  Correlation with serum uric acid is recommended.  IMPRESSION: Negative for osteomyelitis of the forefoot.  Dorsal forefoot ulceration overlying the proximal phalanx of the great toe and blistering along the lateral aspect of the great toe.  No soft tissue abscess.  MRI LEFT ANKLE WITH AND WITHOUT CONTRAST  Technique:  Multiplanar, multisequence MR imaging of the left ankle was performed before and after the administration of intravenous contrast.  Contrast: 15mL MULTIHANCE GADOBENATE DIMEGLUMINE 529 MG/ML IV SOLN  Comparison:  10/16/2012.  Findings: Diffuse subcutaneous edema is present which may represent dependent edema or cellulitis in the appropriate clinical setting.  Diabetic myopathy is present with some  fatty atrophy of the plantar foot musculature.  There is a new ulcer along the lateral aspects of the left calcaneal tuberosity with phlegmon tracking to the cortical surface and osteomyelitis of the lateral calcaneal tuberosity (image number 20 series 17).  These marrow changes are new compared to the prior exam of 10/16/2012 and the ulcer appears new as well.  Midfoot appears within normal limits without evidence of erosions.  The flexor and extensor tendons appear within normal limits.  Talus appears normal.  Medial and lateral malleolus and the tibial plafond are within normal limits.  Small ankle and subtalar effusions are likely reactive.  IMPRESSION: New small area of osteomyelitis of the lateral calcaneal tuberosity with posterior lateral heel ulcer tracking to the focus of osteomyelitis.  No abscess.   Original Report Authenticated By: Andreas Newport, M.D.      Microbiology: Recent Results (from the past 240 hour(s))  WOUND CULTURE     Status: None   Collection Time    12/09/12  4:31 PM      Result Value Range Status   Specimen Description FOOT   Final   Special Requests Normal   Final   Gram Stain     Final   Value: NO WBC SEEN     RARE SQUAMOUS EPITHELIAL CELLS PRESENT     RARE GRAM POSITIVE COCCI IN PAIRS   Culture MODERATE STENOTROPHOMONAS MALTOPHILIA   Final   Report Status 12/12/2012 FINAL   Final   Organism ID, Bacteria STENOTROPHOMONAS MALTOPHILIA   Final     Labs: Basic Metabolic Panel:  Recent Labs Lab  12/14/12 1155 12/15/12 0450 12/16/12 0519 12/17/12 0950 12/18/12 0500  NA 136 136 132* 132* 131*  K 3.3* 3.4* 4.1 4.0 4.3  CL 100 101 100 96 97  CO2 27 26 25 24 25   GLUCOSE 130* 182* 158* 179* 183*  BUN 6 6 6 6 8   CREATININE 1.04 1.09 1.11 1.11 1.17  CALCIUM 8.4 8.1* 8.2* 8.8 8.5  MG  --   --  1.9  --   --    Liver Function Tests: No results found for this basename: AST, ALT, ALKPHOS, BILITOT, PROT, ALBUMIN,  in the last 168 hours No results found for this  basename: LIPASE, AMYLASE,  in the last 168 hours No results found for this basename: AMMONIA,  in the last 168 hours CBC:  Recent Labs Lab 12/12/12 0408 12/13/12 0415 12/15/12 0450 12/16/12 0519 12/17/12 0950  WBC 16.3* 13.9* 11.3* 12.6* 12.1*  HGB 8.6* 8.8* 7.1* 6.8* 9.8*  HCT 26.7* 28.2* 22.4* 20.7* 29.8*  MCV 75.0* 75.0* 74.9* 74.7* 77.4*  PLT 570* 565* 526* 552* 672*   Cardiac Enzymes: No results found for this basename: CKTOTAL, CKMB, CKMBINDEX, TROPONINI,  in the last 168 hours BNP: No components found with this basename: POCBNP,  CBG:  Recent Labs Lab 12/17/12 0724 12/17/12 1140 12/17/12 1712 12/17/12 2215 12/18/12 0725  GLUCAP 134* 157* 132* 140* 217*    Time coordinating discharge:  Greater than 30 minutes  Signed:  Garland Smouse, DO [facility] Pager: [phone] 12/18/2012, 10:20 AM

## 2012-12-18 NOTE — Progress Notes (Signed)
PT Cancellation Note  Patient Details Name: Keith Guzman MRN: [id number] DOB: [birth year]   Cancelled Treatment:    Reason Eval/Treat Not Completed: Other (comment) (pt defers need for more PT ) Pt states he is going home after his IV is complete and is comfortable with being able to walk to enter home   Donnetta Hail 12/18/2012, 11:58 AM

## 2012-12-18 NOTE — Progress Notes (Signed)
DC instructions gone over with patient and wife.  Medications clarified that still needed to be administered today.  Patient and family aware that Genevieve Norlander will be educating them on administering IV medication at home as set up by case management.  Went over medications as well as follow-up appointments which patient and spouse verbalized understanding.  Transported to front of hospital via wheelchair for discharge home.

## 2012-12-22 ENCOUNTER — Ambulatory Visit (HOSPITAL_COMMUNITY)
Admit: 2012-12-22 | Discharge: 2012-12-22 | Disposition: A | Discharge status: Home / Self Care | Payer: BC Managed Care – PPO | Source: Ambulatory Visit | Attending: Physician Assistant | Admitting: Physician Assistant

## 2012-12-22 DIAGNOSIS — I739 Peripheral vascular disease, unspecified: Secondary | ICD-10-CM | POA: Insufficient documentation

## 2012-12-22 HISTORY — PX: OTHER SURGICAL HISTORY: SHX169

## 2012-12-22 NOTE — Progress Notes (Signed)
Arterial Duplex Left Lower Ext. Completed. Keith Guzman

## 2012-12-27 ENCOUNTER — Encounter (HOSPITAL_BASED_OUTPATIENT_CLINIC_OR_DEPARTMENT_OTHER): Payer: BC Managed Care – PPO | Attending: General Surgery

## 2012-12-27 DIAGNOSIS — Y835 Amputation of limb(s) as the cause of abnormal reaction of the patient, or of later complication, without mention of misadventure at the time of the procedure: Secondary | ICD-10-CM | POA: Insufficient documentation

## 2012-12-27 DIAGNOSIS — E1169 Type 2 diabetes mellitus with other specified complication: Secondary | ICD-10-CM | POA: Insufficient documentation

## 2012-12-27 DIAGNOSIS — L97409 Non-pressure chronic ulcer of unspecified heel and midfoot with unspecified severity: Secondary | ICD-10-CM | POA: Insufficient documentation

## 2012-12-27 DIAGNOSIS — T85698A Other mechanical complication of other specified internal prosthetic devices, implants and grafts, initial encounter: Secondary | ICD-10-CM | POA: Insufficient documentation

## 2012-12-27 DIAGNOSIS — T8189XA Other complications of procedures, not elsewhere classified, initial encounter: Secondary | ICD-10-CM | POA: Insufficient documentation

## 2012-12-27 DIAGNOSIS — L97509 Non-pressure chronic ulcer of other part of unspecified foot with unspecified severity: Secondary | ICD-10-CM | POA: Insufficient documentation

## 2012-12-28 ENCOUNTER — Telehealth: Payer: Self-pay | Admitting: *Deleted

## 2012-12-28 LAB — GLUCOSE, CAPILLARY
Glucose-Capillary: 192 mg/dL — ABNORMAL HIGH (ref 70–99)
Glucose-Capillary: 171 mg/dL — ABNORMAL HIGH (ref 70–99)

## 2012-12-28 NOTE — Telephone Encounter (Signed)
Verbal order per Dr. Ninetta Lights to repeat creatinine lab in 1 week given to [facility] nurse. Wendall Mola

## 2012-12-28 NOTE — Telephone Encounter (Signed)
Pt discharged from [facility] care, unable to draw f/u labdwork.  Pt has [facility] appt w/ Dr. Ninetta Lights Monday, 01/02/13 @ 2 PM.

## 2012-12-29 LAB — GLUCOSE, CAPILLARY
Glucose-Capillary: 175 mg/dL — ABNORMAL HIGH (ref 70–99)
Glucose-Capillary: 146 mg/dL — ABNORMAL HIGH (ref 70–99)

## 2013-01-02 ENCOUNTER — Encounter: Payer: Self-pay | Admitting: Infectious Diseases

## 2013-01-02 ENCOUNTER — Ambulatory Visit (INDEPENDENT_AMBULATORY_CARE_PROVIDER_SITE_OTHER): Payer: BC Managed Care – PPO | Admitting: Infectious Diseases

## 2013-01-02 VITALS — BP 114/78 | HR 128 | Temp 98.3°F | Ht 71.5 in | Wt 135.0 lb

## 2013-01-02 DIAGNOSIS — K5900 Constipation, unspecified: Secondary | ICD-10-CM | POA: Insufficient documentation

## 2013-01-02 DIAGNOSIS — M869 Osteomyelitis, unspecified: Secondary | ICD-10-CM

## 2013-01-02 DIAGNOSIS — E1165 Type 2 diabetes mellitus with hyperglycemia: Secondary | ICD-10-CM

## 2013-01-02 MED ORDER — DOCUSATE SODIUM 100 MG PO CAPS: 100.0000 mg | ORAL_CAPSULE | Freq: Two times a day (BID) | ORAL | Status: DC

## 2013-01-02 MED ORDER — SULFAMETHOXAZOLE-TRIMETHOPRIM 800-160 MG PO TABS: 1.0000 | ORAL_TABLET | Freq: Two times a day (BID) | ORAL | Status: AC

## 2013-01-02 NOTE — Progress Notes (Signed)
  Subjective:    Patient ID: Keith Guzman, male    DOB: 10/13/1960, 52 y.o.   MRN: [id number]  HPI 52 yo M with DM2 (for ~20 years but only on medicine x 1 yr) and a non-healing L heel wound beginning roughly January 2014. Wound Cx 10-14-12: MSSA. He had previously been on doxy (pt unsure how long). He underwent L LE atherectomy and angioplasty on 11-29-12. He was eval by wound care team at that time and found to have a L great toe ulcer and L heel wound. They were both found to have a strong odor at that time. He was noted to h/h 8.5/25.7 at d/c. He was d/c off anbx.  He was admitted on 3-12 (from wound care center) with f/c, n/v for 2 days. He noted foul smelling d/c from his great toe wound prior to arrival. He was afebrile in ED, had WBC 17.6k and hgb had dropped to 7.7. He was felt to be septic and started on vanco/zosyn. MRI showed osteomyelitis of his L heel but not his forefoot.  He had a wound Cx in hospital that grew S maltophila. He was changed to IV bactrim and had transmet on 12-14-12. He was sent home 12-18-12 with IV bactrim, which he completed on 12-26-12.  Since d/c has completed anbx as planned, pic pulled. Since d/c his wound has remained open, denies drainage. Feels better per pt. Has occas pain. Denies f/c. Was seen by ortho on 12-28-12 and was felt to be doing well per pt.  FSG have been variable.  No BM for 1 week. Has had flatus. Appetite improving.   Review of Systems  Constitutional: Positive for appetite change. Negative for fever, chills and unexpected weight change.  Gastrointestinal: Positive for constipation. Negative for diarrhea.  Genitourinary: Negative for difficulty urinating.       Objective:   Physical Exam  Constitutional: He appears well-developed and well-nourished.  HENT:  Mouth/Throat: No oropharyngeal exudate.  Eyes: EOM are normal. Pupils are equal, round, and reactive to light.  Neck: Neck supple.  Cardiovascular: Normal rate, regular rhythm and normal  heart sounds.   Pulmonary/Chest: Effort normal and breath sounds normal.  Abdominal: Soft. Bowel sounds are normal. There is no tenderness.  Musculoskeletal:       Feet:  Lymphadenopathy:    He has no cervical adenopathy.  Neurological: A sensory deficit is present.  No light touch in L foot          Assessment & Plan:

## 2013-01-02 NOTE — Assessment & Plan Note (Signed)
Appreciate pt's pcp f/u, good control of his FSG will be key to his healing.

## 2013-01-02 NOTE — Assessment & Plan Note (Addendum)
I am concerned about the wound at his transmet. Will restart his anbx via pill form. Will see him back in 1 month. He will continue to f/u with ortho and wound care center (BIW dressing change, hyperbaric O2).  His ankle wound is clean, no d/c. Will continue to watch this as well.  Would strongly consider repeating his MRI if not improving.

## 2013-01-02 NOTE — Assessment & Plan Note (Signed)
Will give him trial of colace

## 2013-01-02 NOTE — Anesthesia Postprocedure Evaluation (Signed)
  Anesthesia Post-op Note  Patient: Keith Guzman  Procedure(s) Performed: Procedure(s) (LRB): AMPUTATION Left Mid Foot (Left)  Patient Location: PACU  Anesthesia Type: General  Level of Consciousness: awake and alert   Airway and Oxygen Therapy: Patient Spontanous Breathing  Post-op Pain: mild  Post-op Assessment: Post-op Vital signs reviewed, Patient's Cardiovascular Status Stable, Respiratory Function Stable, Patent Airway and No signs of Nausea or vomiting  Last Vitals:  Filed Vitals:   12/18/12 0600  BP: 143/83  Pulse: 94  Temp: 37 C  Resp: 18    Post-op Vital Signs: stable   Complications: No apparent anesthesia complications

## 2013-01-04 ENCOUNTER — Encounter (HOSPITAL_BASED_OUTPATIENT_CLINIC_OR_DEPARTMENT_OTHER): Payer: BC Managed Care – PPO

## 2013-01-05 LAB — GLUCOSE, CAPILLARY
Glucose-Capillary: 252 mg/dL — ABNORMAL HIGH (ref 70–99)
Glucose-Capillary: 278 mg/dL — ABNORMAL HIGH (ref 70–99)

## 2013-01-06 LAB — GLUCOSE, CAPILLARY
Glucose-Capillary: 356 mg/dL — ABNORMAL HIGH (ref 70–99)
Glucose-Capillary: 366 mg/dL — ABNORMAL HIGH (ref 70–99)

## 2013-01-09 LAB — GLUCOSE, CAPILLARY: Glucose-Capillary: 282 mg/dL — ABNORMAL HIGH (ref 70–99)

## 2013-01-16 LAB — GLUCOSE, CAPILLARY
Glucose-Capillary: 343 mg/dL — ABNORMAL HIGH (ref 70–99)
Glucose-Capillary: 309 mg/dL — ABNORMAL HIGH (ref 70–99)

## 2013-01-17 LAB — GLUCOSE, CAPILLARY: Glucose-Capillary: 245 mg/dL — ABNORMAL HIGH (ref 70–99)

## 2013-01-18 LAB — GLUCOSE, CAPILLARY: Glucose-Capillary: 328 mg/dL — ABNORMAL HIGH (ref 70–99)

## 2013-01-19 ENCOUNTER — Encounter: Payer: Self-pay | Admitting: Infectious Diseases

## 2013-01-20 LAB — GLUCOSE, CAPILLARY: Glucose-Capillary: 306 mg/dL — ABNORMAL HIGH (ref 70–99)

## 2013-01-25 LAB — GLUCOSE, CAPILLARY: Glucose-Capillary: 259 mg/dL — ABNORMAL HIGH (ref 70–99)

## 2013-01-26 ENCOUNTER — Encounter (HOSPITAL_BASED_OUTPATIENT_CLINIC_OR_DEPARTMENT_OTHER): Payer: BC Managed Care – PPO | Attending: General Surgery

## 2013-01-26 DIAGNOSIS — Y831 Surgical operation with implant of artificial internal device as the cause of abnormal reaction of the patient, or of later complication, without mention of misadventure at the time of the procedure: Secondary | ICD-10-CM | POA: Insufficient documentation

## 2013-01-26 DIAGNOSIS — T85898A Other specified complication of other internal prosthetic devices, implants and grafts, initial encounter: Secondary | ICD-10-CM | POA: Insufficient documentation

## 2013-01-26 DIAGNOSIS — E1169 Type 2 diabetes mellitus with other specified complication: Secondary | ICD-10-CM | POA: Insufficient documentation

## 2013-01-26 DIAGNOSIS — L97509 Non-pressure chronic ulcer of other part of unspecified foot with unspecified severity: Secondary | ICD-10-CM | POA: Insufficient documentation

## 2013-01-26 DIAGNOSIS — S98919A Complete traumatic amputation of unspecified foot, level unspecified, initial encounter: Secondary | ICD-10-CM | POA: Insufficient documentation

## 2013-01-27 LAB — GLUCOSE, CAPILLARY
Glucose-Capillary: 241 mg/dL — ABNORMAL HIGH (ref 70–99)
Glucose-Capillary: 217 mg/dL — ABNORMAL HIGH (ref 70–99)
Glucose-Capillary: 245 mg/dL — ABNORMAL HIGH (ref 70–99)

## 2013-01-31 LAB — GLUCOSE, CAPILLARY: Glucose-Capillary: 194 mg/dL — ABNORMAL HIGH (ref 70–99)

## 2013-02-01 ENCOUNTER — Other Ambulatory Visit (HOSPITAL_COMMUNITY): Payer: Self-pay | Admitting: Gastroenterology

## 2013-02-01 ENCOUNTER — Ambulatory Visit (INDEPENDENT_AMBULATORY_CARE_PROVIDER_SITE_OTHER): Payer: BC Managed Care – PPO | Admitting: Infectious Diseases

## 2013-02-01 ENCOUNTER — Encounter: Payer: Self-pay | Admitting: Infectious Diseases

## 2013-02-01 VITALS — BP 110/70 | HR 100 | Temp 97.9°F | Ht 71.5 in | Wt 128.0 lb

## 2013-02-01 DIAGNOSIS — M869 Osteomyelitis, unspecified: Secondary | ICD-10-CM

## 2013-02-01 DIAGNOSIS — R1013 Epigastric pain: Secondary | ICD-10-CM

## 2013-02-01 NOTE — Progress Notes (Signed)
  Subjective:    Patient ID: Keith Guzman, male    DOB: 06/08/1961, 52 y.o.   MRN: [id number]  HPI 52 yo M with DM2 (for ~20 years but only on medicine x 1 yr) and a non-healing L heel wound beginning roughly January 2014. Wound Cx 10-14-12: MSSA. He had previously been on doxy (pt unsure how long). He underwent L LE atherectomy and angioplasty on 11-29-12. He was eval by wound care team at that time and found to have a L great toe ulcer and L heel wound. They were both found to have a strong odor at that time. He was noted to h/h 8.5/25.7 at d/c. He was d/c off anbx.  He was admitted on 3-12 (from wound care center) with f/c, n/v for 2 days. He noted foul smelling d/c from his great toe wound prior to arrival. He was afebrile in ED, had WBC 17.6k and hgb had dropped to 7.7. He was felt to be septic and started on vanco/zosyn. MRI showed osteomyelitis of his L heel but not his forefoot.  He had a wound Cx in hospital that grew S maltophila. He was changed to IV bactrim and had transmet on 12-14-12. He was sent home 12-18-12 with IV bactrim, which he completed on 12-26-12.  He was seen in ID clinic 4-7 and concern was raised over wound healing. He was restarted on bactrim (po) and was to continue to follow at wound care ctr.  Wt has gone 165 ---> 128. Having problems with n/v, going to GI today. Having trouble keeping his medicines down. N/v is with or without medications.  Foot has been doing better. Feels like wound is cleaner. Is getting smaller. Cont to f/u at wound center. No f/c. Ability to bear wt is better. Has small mt of drainage from wound.   FSG have been high (290s, then has dropped to 190s). Follows at Dr Farris Has.   Review of Systems     Objective:   Physical Exam  Constitutional: He appears well-developed and well-nourished. No distress.  Musculoskeletal:       Feet:          Assessment & Plan:

## 2013-02-01 NOTE — Assessment & Plan Note (Addendum)
Per pt and his wife, his foot is improving. I am hopeful that the cause of his GI upset is not the bactrim. He will be seen by GI to address this. Could change to levaquin if needed (steno was sensitive to this). Will see him back in 1 month to re-address. He and his wife agree to defer repeat MRI at this point. Reinforced to him need to control FSG (although very difficult with his GI upset).

## 2013-02-02 ENCOUNTER — Ambulatory Visit (HOSPITAL_COMMUNITY)
Admission: RE | Admit: 2013-02-02 | Discharge: 2013-02-02 | Disposition: A | Discharge status: Home / Self Care | Payer: BC Managed Care – PPO | Source: Ambulatory Visit | Attending: Gastroenterology | Admitting: Gastroenterology

## 2013-02-02 DIAGNOSIS — R1110 Vomiting, unspecified: Secondary | ICD-10-CM | POA: Insufficient documentation

## 2013-02-02 DIAGNOSIS — K3189 Other diseases of stomach and duodenum: Secondary | ICD-10-CM | POA: Insufficient documentation

## 2013-02-02 DIAGNOSIS — R1013 Epigastric pain: Secondary | ICD-10-CM | POA: Insufficient documentation

## 2013-02-02 DIAGNOSIS — E119 Type 2 diabetes mellitus without complications: Secondary | ICD-10-CM | POA: Insufficient documentation

## 2013-02-02 DIAGNOSIS — R109 Unspecified abdominal pain: Secondary | ICD-10-CM | POA: Insufficient documentation

## 2013-02-02 MED ORDER — TECHNETIUM TC 99M SULFUR COLLOID
2.1000 | Freq: Once | INTRAVENOUS | Status: AC | PRN
  Administered 2013-02-02: 2.1 via INTRAVENOUS

## 2013-02-03 ENCOUNTER — Ambulatory Visit (INDEPENDENT_AMBULATORY_CARE_PROVIDER_SITE_OTHER): Payer: BC Managed Care – PPO | Admitting: General Surgery

## 2013-02-03 ENCOUNTER — Encounter (INDEPENDENT_AMBULATORY_CARE_PROVIDER_SITE_OTHER): Payer: Self-pay | Admitting: General Surgery

## 2013-02-03 VITALS — BP 122/62 | HR 64 | Temp 98.0°F | Resp 18 | Ht 71.5 in | Wt 131.0 lb

## 2013-02-03 DIAGNOSIS — K3184 Gastroparesis: Secondary | ICD-10-CM

## 2013-02-03 DIAGNOSIS — E1143 Type 2 diabetes mellitus with diabetic autonomic (poly)neuropathy: Secondary | ICD-10-CM

## 2013-02-03 DIAGNOSIS — R1013 Epigastric pain: Secondary | ICD-10-CM

## 2013-02-03 MED ORDER — METOCLOPRAMIDE HCL 10 MG PO TABS: 10.0000 mg | ORAL_TABLET | Freq: Four times a day (QID) | ORAL | Status: DC
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
## 2013-02-07 LAB — GLUCOSE, CAPILLARY
Glucose-Capillary: 178 mg/dL — ABNORMAL HIGH (ref 70–99)
Glucose-Capillary: 181 mg/dL — ABNORMAL HIGH (ref 70–99)

## 2013-02-08 ENCOUNTER — Other Ambulatory Visit: Payer: BC Managed Care – PPO

## 2013-02-08 LAB — GLUCOSE, CAPILLARY
Glucose-Capillary: 212 mg/dL — ABNORMAL HIGH (ref 70–99)
Glucose-Capillary: 156 mg/dL — ABNORMAL HIGH (ref 70–99)

## 2013-02-09 NOTE — Progress Notes (Signed)
[facility]  NAMESABEN, DONIGAN NO.:  [id number]  MEDICAL RECORD NO.:  [id number]      DATE OF BIRTH:  [birth year]  PHYSICIAN:  Ardath Sax, M.D.           VISIT DATE:                                  OFFICE VISIT   Keith Guzman is a 52 year old diabetic man who has had great problems with diabetic foot ulcers.  His last problem resulted getting gangrenous toes and he had to have a transmetatarsal amputation.  After this the flap failed and he has been getting hyperbaric oxygen treatments and collagen dressings which have dramatically showed great progress in healing this wound.  We are coalescing it as a diabetic foot ulcer and a nonhealing surgical wound of his left foot following a transmetatarsal amputation. The ulcer is over 50% smaller than it was before we started hyperbaric oxygen treatments, and we would like to get him through another series of daily treatments forone month.     Ardath Sax, M.D.     PP/MEDQ  D:  02/08/2013  T:  02/09/2013  Job:  [id number]

## 2013-02-10 ENCOUNTER — Encounter (INDEPENDENT_AMBULATORY_CARE_PROVIDER_SITE_OTHER): Payer: Self-pay

## 2013-02-10 LAB — GLUCOSE, CAPILLARY
Glucose-Capillary: 250 mg/dL — ABNORMAL HIGH (ref 70–99)
Glucose-Capillary: 181 mg/dL — ABNORMAL HIGH (ref 70–99)

## 2013-02-13 LAB — GLUCOSE, CAPILLARY: Glucose-Capillary: 233 mg/dL — ABNORMAL HIGH (ref 70–99)

## 2013-02-14 ENCOUNTER — Other Ambulatory Visit: Payer: BC Managed Care – PPO

## 2013-02-14 LAB — GLUCOSE, CAPILLARY: Glucose-Capillary: 187 mg/dL — ABNORMAL HIGH (ref 70–99)

## 2013-02-22 LAB — GLUCOSE, CAPILLARY
Glucose-Capillary: 127 mg/dL — ABNORMAL HIGH (ref 70–99)
Glucose-Capillary: 133 mg/dL — ABNORMAL HIGH (ref 70–99)

## 2013-02-23 LAB — GLUCOSE, CAPILLARY: Glucose-Capillary: 96 mg/dL (ref 70–99)

## 2013-02-24 LAB — GLUCOSE, CAPILLARY: Glucose-Capillary: 176 mg/dL — ABNORMAL HIGH (ref 70–99)

## 2013-02-27 ENCOUNTER — Encounter (HOSPITAL_BASED_OUTPATIENT_CLINIC_OR_DEPARTMENT_OTHER): Payer: BC Managed Care – PPO | Attending: General Surgery

## 2013-02-27 DIAGNOSIS — E1169 Type 2 diabetes mellitus with other specified complication: Secondary | ICD-10-CM | POA: Insufficient documentation

## 2013-02-27 DIAGNOSIS — L97509 Non-pressure chronic ulcer of other part of unspecified foot with unspecified severity: Secondary | ICD-10-CM | POA: Insufficient documentation

## 2013-02-27 LAB — GLUCOSE, CAPILLARY
Glucose-Capillary: 131 mg/dL — ABNORMAL HIGH (ref 70–99)
Glucose-Capillary: 156 mg/dL — ABNORMAL HIGH (ref 70–99)

## 2013-03-06 ENCOUNTER — Ambulatory Visit: Payer: BC Managed Care – PPO | Admitting: Infectious Diseases

## 2013-03-10 ENCOUNTER — Ambulatory Visit (INDEPENDENT_AMBULATORY_CARE_PROVIDER_SITE_OTHER): Payer: BC Managed Care – PPO | Admitting: General Surgery

## 2013-03-10 ENCOUNTER — Encounter (INDEPENDENT_AMBULATORY_CARE_PROVIDER_SITE_OTHER): Payer: Self-pay | Admitting: General Surgery

## 2013-03-10 VITALS — BP 122/80 | HR 116 | Temp 97.6°F | Resp 14 | Ht 71.5 in | Wt 129.0 lb

## 2013-03-10 DIAGNOSIS — E1149 Type 2 diabetes mellitus with other diabetic neurological complication: Secondary | ICD-10-CM

## 2013-03-10 DIAGNOSIS — K3184 Gastroparesis: Secondary | ICD-10-CM

## 2013-03-10 DIAGNOSIS — E1143 Type 2 diabetes mellitus with diabetic autonomic (poly)neuropathy: Secondary | ICD-10-CM

## 2013-03-10 MED ORDER — ONDANSETRON 4 MG PO TBDP: 4.0000 mg | ORAL_TABLET | Freq: Three times a day (TID) | ORAL | Status: DC | PRN

## 2013-03-10 NOTE — Progress Notes (Signed)
Subjective:     Patient ID: Keith Guzman, male   DOB: August 18, 1961, 52 y.o.   MRN: [id number]  HPI This patient follows up for further evaluation of his gastroparesis. He does have a gastric imaging study which was delayed and abnormal and I have set him up for ultrasound of the abdomen to evaluate for possible cholelithiasis as well. He has not had this ultrasound yet. He says that he is much improved since our last visit and has been taking Reglan twice a day although of he has been unable to take it as prescribed. He says that overall he is much improved and really hasn't had the nausea and vomiting except when he overeats. He says that his sugars are well controlled and he is not have any more abdominal pain. His bowels are working well moving about every other day. He has gained 1 pound. He is still spitting up saliva due to the taste. He is undergoing hyperbaric treatment for healing of his foot wound  Review of Systems     Objective:   Physical Exam No acute distress and nontoxic-appearing sitting comfortably on the chair His abdomen is soft and nontender on exam    Assessment:     Gastroparesis I think that his symptoms do sound as though they could be related to gastroparesis. He says that his diabetes is much better controlled and he has been getting some relief with gastroparesis diet and the Reglan. His abdominal pain is improved in his not having much nausea and vomiting except with hyperbarics. I did recommend that he proceed with the abdominal ultrasound to evaluate for possible cholelithiasis but at this point, since his symptoms are much improved not sure that any surgery is necessary at this time for treatment of his gastroparesis. I would continue Reglan and I counseled him in the use of this. I recommended that he take this 30 minutes prior to being 3 times a day and that he continue with small frequent meals and gastric paresis diet. I also offered to give him a prescription for  some Zofran to take prior to his hyperbaric treatment.     Plan:     Continue with gastric paresis diet and Reglan and we will check the ultrasound of the abdomen to evaluate for cholelithiasis and he will followup with me after his ultrasound or if symptoms do not continue to improve with conservative management

## 2013-03-13 LAB — GLUCOSE, CAPILLARY: Glucose-Capillary: 208 mg/dL — ABNORMAL HIGH (ref 70–99)

## 2013-03-16 ENCOUNTER — Ambulatory Visit
Admission: RE | Admit: 2013-03-16 | Discharge: 2013-03-16 | Disposition: A | Discharge status: Home / Self Care | Payer: BC Managed Care – PPO | Source: Ambulatory Visit | Attending: General Surgery | Admitting: General Surgery

## 2013-03-16 DIAGNOSIS — R1013 Epigastric pain: Secondary | ICD-10-CM

## 2013-03-29 ENCOUNTER — Encounter (HOSPITAL_BASED_OUTPATIENT_CLINIC_OR_DEPARTMENT_OTHER): Payer: BC Managed Care – PPO | Attending: General Surgery

## 2013-03-29 DIAGNOSIS — L97509 Non-pressure chronic ulcer of other part of unspecified foot with unspecified severity: Secondary | ICD-10-CM | POA: Insufficient documentation

## 2013-03-29 DIAGNOSIS — E1169 Type 2 diabetes mellitus with other specified complication: Secondary | ICD-10-CM | POA: Insufficient documentation

## 2013-03-29 LAB — GLUCOSE, CAPILLARY

## 2013-04-28 ENCOUNTER — Telehealth (HOSPITAL_COMMUNITY): Payer: Self-pay | Admitting: Cardiovascular Disease

## 2013-05-08 ENCOUNTER — Other Ambulatory Visit (HOSPITAL_COMMUNITY): Payer: Self-pay | Admitting: Cardiovascular Disease

## 2013-05-08 DIAGNOSIS — I739 Peripheral vascular disease, unspecified: Secondary | ICD-10-CM
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
## 2013-06-26 ENCOUNTER — Encounter (HOSPITAL_COMMUNITY): Payer: BC Managed Care – PPO

## 2013-07-03 ENCOUNTER — Ambulatory Visit (HOSPITAL_COMMUNITY)
Admission: RE | Admit: 2013-07-03 | Discharge: 2013-07-03 | Disposition: A | Discharge status: Home / Self Care | Payer: BC Managed Care – PPO | Source: Ambulatory Visit | Attending: Cardiovascular Disease | Admitting: Cardiovascular Disease

## 2013-07-03 DIAGNOSIS — I739 Peripheral vascular disease, unspecified: Secondary | ICD-10-CM

## 2013-07-03 DIAGNOSIS — I70219 Atherosclerosis of native arteries of extremities with intermittent claudication, unspecified extremity: Secondary | ICD-10-CM

## 2013-07-03 NOTE — Progress Notes (Signed)
Arterial Duplex Lower Ext. Completed. Laquandra Carrillo, BS, RDMS, RVT  

## 2013-07-07 ENCOUNTER — Encounter: Payer: Self-pay | Admitting: *Deleted

## 2013-07-07 ENCOUNTER — Telehealth: Payer: Self-pay | Admitting: *Deleted

## 2013-07-07 ENCOUNTER — Other Ambulatory Visit: Payer: Self-pay | Admitting: Family Medicine

## 2013-07-07 DIAGNOSIS — I739 Peripheral vascular disease, unspecified: Secondary | ICD-10-CM

## 2013-07-07 DIAGNOSIS — R1011 Right upper quadrant pain: Secondary | ICD-10-CM

## 2013-07-07 NOTE — Telephone Encounter (Signed)
Message copied by Marella Bile on Fri Jul 07, 2013  5:35 PM ------      Message from: Runell Gess      Created: Thu Jul 06, 2013  6:48 PM       Progression of disease RSFA & Left pop. Repeat 6 months ------

## 2013-07-07 NOTE — Telephone Encounter (Signed)
Order placed for repeat lower ext doppler in 6 months 

## 2013-07-26 ENCOUNTER — Ambulatory Visit
Admission: RE | Admit: 2013-07-26 | Discharge: 2013-07-26 | Disposition: A | Discharge status: Home / Self Care | Payer: BC Managed Care – PPO | Source: Ambulatory Visit | Attending: Family Medicine | Admitting: Family Medicine

## 2013-07-26 DIAGNOSIS — R1011 Right upper quadrant pain: Secondary | ICD-10-CM

## 2013-08-04 ENCOUNTER — Other Ambulatory Visit: Payer: Self-pay | Admitting: Family Medicine

## 2013-08-04 ENCOUNTER — Ambulatory Visit
Admission: RE | Admit: 2013-08-04 | Discharge: 2013-08-04 | Disposition: A | Discharge status: Home / Self Care | Payer: BC Managed Care – PPO | Source: Ambulatory Visit | Attending: Family Medicine | Admitting: Family Medicine

## 2013-08-04 DIAGNOSIS — J90 Pleural effusion, not elsewhere classified: Secondary | ICD-10-CM

## 2013-10-10 ENCOUNTER — Other Ambulatory Visit: Payer: Self-pay | Admitting: Family Medicine

## 2013-10-10 ENCOUNTER — Ambulatory Visit
Admission: RE | Admit: 2013-10-10 | Discharge: 2013-10-10 | Disposition: A | Discharge status: Home / Self Care | Payer: 59 | Source: Ambulatory Visit | Attending: Family Medicine | Admitting: Family Medicine

## 2013-10-10 DIAGNOSIS — E119 Type 2 diabetes mellitus without complications: Secondary | ICD-10-CM

## 2013-10-16 ENCOUNTER — Institutional Professional Consult (permissible substitution): Payer: 59 | Admitting: Internal Medicine

## 2013-10-16 ENCOUNTER — Other Ambulatory Visit: Payer: 59

## 2013-10-16 ENCOUNTER — Encounter: Payer: Self-pay | Admitting: Internal Medicine

## 2013-10-16 ENCOUNTER — Other Ambulatory Visit (INDEPENDENT_AMBULATORY_CARE_PROVIDER_SITE_OTHER): Payer: 59

## 2013-10-16 ENCOUNTER — Ambulatory Visit (INDEPENDENT_AMBULATORY_CARE_PROVIDER_SITE_OTHER): Payer: 59 | Admitting: Internal Medicine

## 2013-10-16 VITALS — BP 144/80 | HR 106 | Temp 98.0°F | Ht 71.0 in | Wt 150.0 lb

## 2013-10-16 DIAGNOSIS — J90 Pleural effusion, not elsewhere classified: Secondary | ICD-10-CM

## 2013-10-16 LAB — BASIC METABOLIC PANEL
BUN: 8 mg/dL (ref 6–23)
CO2: 29 mEq/L (ref 19–32)
CREATININE: 0.9 mg/dL (ref 0.4–1.5)
Calcium: 9.1 mg/dL (ref 8.4–10.5)
Chloride: 102 mEq/L (ref 96–112)
GFR: 114.35 mL/min (ref >60.00)
GLUCOSE: 94 mg/dL (ref 70–99)
Potassium: 3.4 mEq/L — ABNORMAL LOW (ref 3.5–5.1)
Sodium: 140 mEq/L (ref 135–145)

## 2013-10-16 LAB — HEPATIC FUNCTION PANEL
ALBUMIN: 4 g/dL (ref 3.5–5.2)
ALK PHOS: 57 U/L (ref 39–117)
ALT: 11 U/L (ref 0–53)
AST: 16 U/L (ref 0–37)
Bilirubin, Direct: 0.1 mg/dL (ref 0.0–0.3)
Total Bilirubin: 0.7 mg/dL (ref 0.3–1.2)
Total Protein: 7.9 g/dL (ref 6.0–8.3)

## 2013-10-16 LAB — BRAIN NATRIURETIC PEPTIDE: Pro B Natriuretic peptide (BNP): 509 pg/mL — ABNORMAL HIGH (ref 0.0–100.0)

## 2013-10-16 LAB — TSH: TSH: 1.45 u[IU]/mL (ref 0.35–5.50)

## 2013-10-16 MED ORDER — FUROSEMIDE 20 MG PO TABS: 20.0000 mg | ORAL_TABLET | Freq: Every day | ORAL | Status: DC

## 2013-10-16 NOTE — Patient Instructions (Signed)
Furosemide 20 mg one daily   Please remember to go to the lab  department downstairs for your tests - we will call you with the results when they are available.  Please schedule a follow up office visit in 4 weeks, sooner if needed
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
## 2013-10-17 ENCOUNTER — Other Ambulatory Visit: Payer: Self-pay | Admitting: Internal Medicine

## 2013-10-17 DIAGNOSIS — J90 Pleural effusion, not elsewhere classified: Secondary | ICD-10-CM

## 2013-10-17 NOTE — Progress Notes (Signed)
Quick Note:  Spoke with pt and notified of results per Dr. Wert. Pt verbalized understanding and denied any questions.  ______ 

## 2013-10-17 NOTE — Assessment & Plan Note (Addendum)
-    BNP 509 10/16/13  (ESR requested but not done due to EMR glitch),  tsh and alb and creat ok  ddx for bilateral effusion is actually quite limited in a pt with nl albumin, creat  and TSH and most likley related to occult CHF  rec echo next step and in meantime start low dose lasix

## 2013-11-13 ENCOUNTER — Ambulatory Visit (HOSPITAL_COMMUNITY): Payer: 59 | Attending: Cardiovascular Disease | Admitting: Cardiology

## 2013-11-13 ENCOUNTER — Encounter: Payer: Self-pay | Admitting: Internal Medicine

## 2013-11-13 ENCOUNTER — Encounter: Payer: Self-pay | Admitting: Cardiovascular Disease

## 2013-11-13 ENCOUNTER — Ambulatory Visit (INDEPENDENT_AMBULATORY_CARE_PROVIDER_SITE_OTHER): Payer: 59 | Admitting: Internal Medicine

## 2013-11-13 ENCOUNTER — Telehealth: Payer: Self-pay | Admitting: Internal Medicine

## 2013-11-13 ENCOUNTER — Ambulatory Visit (INDEPENDENT_AMBULATORY_CARE_PROVIDER_SITE_OTHER)
Admission: RE | Admit: 2013-11-13 | Discharge: 2013-11-13 | Disposition: A | Discharge status: Home / Self Care | Payer: 59 | Source: Ambulatory Visit | Attending: Internal Medicine | Admitting: Internal Medicine

## 2013-11-13 VITALS — BP 110/76 | HR 100 | Temp 97.7°F | Ht 71.5 in | Wt 149.0 lb

## 2013-11-13 DIAGNOSIS — I509 Heart failure, unspecified: Secondary | ICD-10-CM | POA: Insufficient documentation

## 2013-11-13 DIAGNOSIS — J90 Pleural effusion, not elsewhere classified: Secondary | ICD-10-CM

## 2013-11-13 DIAGNOSIS — Z87891 Personal history of nicotine dependence: Secondary | ICD-10-CM | POA: Insufficient documentation

## 2013-11-13 DIAGNOSIS — I5022 Chronic systolic (congestive) heart failure: Secondary | ICD-10-CM

## 2013-11-13 DIAGNOSIS — I079 Rheumatic tricuspid valve disease, unspecified: Secondary | ICD-10-CM | POA: Insufficient documentation

## 2013-11-13 DIAGNOSIS — I059 Rheumatic mitral valve disease, unspecified: Secondary | ICD-10-CM | POA: Insufficient documentation

## 2013-11-13 DIAGNOSIS — I379 Nonrheumatic pulmonary valve disorder, unspecified: Secondary | ICD-10-CM | POA: Insufficient documentation

## 2013-11-13 MED ORDER — SPIRONOLACTONE 50 MG PO TABS: 50.0000 mg | ORAL_TABLET | Freq: Every day | ORAL | Status: DC

## 2013-11-13 NOTE — Assessment & Plan Note (Signed)
-    BNP 509 10/16/13   > lasix 20 mg per day > improved 11/13/2013 so added aldactone 50 mg daily  Echo c/w systolic chf > see sep a/p

## 2013-11-13 NOTE — Progress Notes (Signed)
Echo performed. 

## 2013-11-13 NOTE — Progress Notes (Signed)
Quick Note:  LMTCB ______ 
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
## 2013-11-13 NOTE — Patient Instructions (Addendum)
Add aldactone 50 mg one daily to the lasix 20 mg daily  Please remember to go to the  x-ray department downstairs for your tests - we will call you with the results when they are available.  See Tammy NP 1- 2 weeks with all your medications, even over the counter meds, separated in two separate bags, the ones you take no matter what vs the ones you stop once you feel better and take only as needed when you feel you need them.   Tammy  will generate for you a new user friendly medication calendar that will put [place] all on the same page re: your medication use.     Without this process, it simply isn't possible to assure that we are providing  your outpatient care  with  the attention to detail we feel you deserve.   If we cannot assure that you're getting that kind of care,  then we cannot manage your problem effectively from this clinic.  Once you have seen Tammy and we are sure that we're all on the same page with your medication use she will arrange follow up with me.

## 2013-11-13 NOTE — Telephone Encounter (Signed)
Notes Recorded by Christen Butter, CMA on 11/13/2013 at 12:56 PM LMTCB ------  Notes Recorded by Nyoka Cowden, MD on 11/13/2013 at 12:26 PM Call patient : Study is c/w chf, needs cards consult      Pt aware and order placed.Carron Curie, CMA

## 2013-11-13 NOTE — Assessment & Plan Note (Signed)
-   Left ventricle: The cavity size was moderately dilated. Systolic function was severely reduced. The estimated ejection fraction was in the range of 20% to 25%. - Mitral valve: Mild regurgitation. - Left atrium: The atrium was mildly dilated. - Right ventricle: The cavity size was mildly dilated. - Atrial septum: No defect or patent foramen ovale was identified. - Pulmonary arteries: PA peak pressure: 30mm Hg (S). - Pericardium, extracardiac: A trivial pericardial effusion was identified. - Impressions: Large left pleural effusion > referred to cards  Pleural effusions improving with only 20 lasix daily > add aldactone 50 mg daily pending cards f/u

## 2013-11-15 NOTE — Progress Notes (Signed)
Quick Note:  Spoke with pt and notified of results per Dr. Wert. Pt verbalized understanding and denied any questions.  ______ 

## 2013-11-24 ENCOUNTER — Encounter: Payer: 59 | Admitting: Adult Health

## 2013-11-24 ENCOUNTER — Other Ambulatory Visit (INDEPENDENT_AMBULATORY_CARE_PROVIDER_SITE_OTHER): Payer: 59

## 2013-11-24 ENCOUNTER — Encounter: Payer: Self-pay | Admitting: Adult Health

## 2013-11-24 ENCOUNTER — Ambulatory Visit (INDEPENDENT_AMBULATORY_CARE_PROVIDER_SITE_OTHER): Payer: 59 | Admitting: Adult Health

## 2013-11-24 VITALS — BP 122/84 | HR 104 | Temp 97.5°F | Ht 71.5 in | Wt 149.0 lb

## 2013-11-24 DIAGNOSIS — I5022 Chronic systolic (congestive) heart failure: Secondary | ICD-10-CM

## 2013-11-24 DIAGNOSIS — J90 Pleural effusion, not elsewhere classified: Secondary | ICD-10-CM

## 2013-11-24 LAB — BASIC METABOLIC PANEL
BUN: 19 mg/dL (ref 6–23)
CALCIUM: 9.9 mg/dL (ref 8.4–10.5)
CO2: 32 meq/L (ref 19–32)
Chloride: 99 mEq/L (ref 96–112)
Creatinine, Ser: 1 mg/dL (ref 0.4–1.5)
GFR: 97.66 mL/min (ref >60.00)
GLUCOSE: 174 mg/dL — AB (ref 70–99)
POTASSIUM: 4.5 meq/L (ref 3.5–5.1)
Sodium: 137 mEq/L (ref 135–145)

## 2013-11-24 NOTE — Assessment & Plan Note (Signed)
Pleural effusion ?  CHF related.  Improved on last cxr   Plan  Cont on diuretics  Repeat cxr on return in 2-3 weeks.

## 2013-11-24 NOTE — Addendum Note (Signed)
Addended by: Boone Master E on: 11/24/2013 04:24 PM   Modules accepted: Orders

## 2013-11-24 NOTE — Assessment & Plan Note (Signed)
Improved symptom control on diuretics  Cardiology referral pending  Check bmet today   Patient's medications were reviewed today and patient education was given. Computerized medication calendar was adjusted/completed   Plan  Follow med calendar closely and bring to each visit.  Continue on current regimen .  Labs today  Follow up with cardiology as planned  Follow up Dr. Sherene Sires  In 3-4 weeks with chest xray.  Please contact office for sooner follow up if symptoms do not improve or worsen or seek emergency care
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
## 2013-11-24 NOTE — Patient Instructions (Signed)
Follow med calendar closely and bring to each visit.  Continue on current regimen .  Labs today  Follow up with cardiology as planned  Follow up Dr. Sherene Sires  In 3-4 weeks with chest xray.  Please contact office for sooner follow up if symptoms do not improve or worsen or seek emergency care

## 2013-11-27 NOTE — Addendum Note (Signed)
Addended by: Boone Master E on: 11/27/2013 12:45 PM   Modules accepted: Orders

## 2013-12-09 ENCOUNTER — Encounter: Payer: Self-pay | Admitting: Cardiology

## 2013-12-11 ENCOUNTER — Encounter: Payer: Self-pay | Admitting: Cardiology

## 2013-12-11 ENCOUNTER — Ambulatory Visit (INDEPENDENT_AMBULATORY_CARE_PROVIDER_SITE_OTHER): Payer: 59 | Admitting: Cardiology

## 2013-12-11 VITALS — BP 120/80 | HR 102 | Ht 71.0 in | Wt 144.8 lb

## 2013-12-11 DIAGNOSIS — E785 Hyperlipidemia, unspecified: Secondary | ICD-10-CM

## 2013-12-11 DIAGNOSIS — I428 Other cardiomyopathies: Secondary | ICD-10-CM

## 2013-12-11 DIAGNOSIS — I5022 Chronic systolic (congestive) heart failure: Secondary | ICD-10-CM

## 2013-12-11 DIAGNOSIS — I10 Essential (primary) hypertension: Secondary | ICD-10-CM

## 2013-12-11 DIAGNOSIS — R0989 Other specified symptoms and signs involving the circulatory and respiratory systems: Secondary | ICD-10-CM | POA: Insufficient documentation

## 2013-12-11 DIAGNOSIS — I739 Peripheral vascular disease, unspecified: Secondary | ICD-10-CM

## 2013-12-11 DIAGNOSIS — R0609 Other forms of dyspnea: Secondary | ICD-10-CM

## 2013-12-11 DIAGNOSIS — I420 Dilated cardiomyopathy: Secondary | ICD-10-CM | POA: Insufficient documentation

## 2013-12-11 DIAGNOSIS — R0600 Dyspnea, unspecified: Secondary | ICD-10-CM

## 2013-12-11 MED ORDER — ATORVASTATIN CALCIUM 80 MG PO TABS: 80.0000 mg | ORAL_TABLET | Freq: Every day | ORAL | Status: DC

## 2013-12-11 MED ORDER — CARVEDILOL 6.25 MG PO TABS: 6.2500 mg | ORAL_TABLET | Freq: Two times a day (BID) | ORAL | Status: DC

## 2013-12-11 MED ORDER — LISINOPRIL 5 MG PO TABS: 5.0000 mg | ORAL_TABLET | Freq: Every day | ORAL | Status: DC

## 2013-12-11 NOTE — Assessment & Plan Note (Signed)
Patient appears to be euvolemic on examination. Continue present dose of diuretics. 

## 2013-12-11 NOTE — Assessment & Plan Note (Signed)
Continue aspirin and statin. 

## 2013-12-11 NOTE — Assessment & Plan Note (Signed)
Check carotid Dopplers °
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
## 2013-12-11 NOTE — Assessment & Plan Note (Signed)
Given documented vascular disease and diabetes mellitus I would like for him to be on high-dose statin. Discontinue Zocor. Begin Lipitor 80 mg daily. Check lipids and liver in 6 weeks.

## 2013-12-11 NOTE — Patient Instructions (Signed)
Your physician recommends that you schedule a follow-up appointment in: 6 WEEKS WITH DR CRENSHAW  STOP AMLODIPINE  START LISINOPRIL 5 MG ONCE DAILY  START CARVEDILOL 6.25 MG TWICE DAILY  STOP SIMVASTATIN  START LIPITOR 80 MG ONCE DAILY  Your physician recommends that you return for lab work in: ONE WEEK  Your physician recommends that you return for lab work in: 6 WEEKS AFTER STARTING LIPITOR- DO NOT EAT PRIOR TO LAB WORK  Your physician has requested that you have a carotid duplex. This test is an ultrasound of the carotid arteries in your neck. It looks at blood flow through these arteries that supply the brain with blood. Allow one hour for this exam. There are no restrictions or special instructions.

## 2013-12-11 NOTE — Assessment & Plan Note (Signed)
Adjust blood pressure medications to also treat cardiomyopathy. Plan as outlined under cardiomyopathy.

## 2013-12-11 NOTE — Assessment & Plan Note (Addendum)
Patient has been found to have a cardiomyopathy. Etiology unclear. No history of alcohol. Blood pressure controlled. Possibly secondary to coronary disease given documented vascular disease and risk factors. I recommended cardiac catheterization today. The risks and benefits were discussed. He would like to discuss this further with his family before making a final decision. He will contact us if agreeable. In the meantime discontinue amlodipine. Begin lisinopril 5 mg daily. Check potassium, renal function and BNP in one week. Add carvedilol 3.125 mg by mouth twice a day. Titrate medications as tolerated by blood pressure. Once medications are fully titrated and coronary disease excluded we will repeat echocardiogram. His ejection fraction less than 35% would need to consider ICD. Note patient has been anemic previously. I will check a CBC prior to proceeding with catheterization.

## 2013-12-12 ENCOUNTER — Encounter: Payer: Self-pay | Admitting: Internal Medicine

## 2013-12-15 ENCOUNTER — Encounter: Payer: Self-pay | Admitting: Internal Medicine

## 2013-12-15 ENCOUNTER — Ambulatory Visit (INDEPENDENT_AMBULATORY_CARE_PROVIDER_SITE_OTHER): Payer: 59 | Admitting: Internal Medicine

## 2013-12-15 ENCOUNTER — Ambulatory Visit (INDEPENDENT_AMBULATORY_CARE_PROVIDER_SITE_OTHER)
Admission: RE | Admit: 2013-12-15 | Discharge: 2013-12-15 | Disposition: A | Discharge status: Home / Self Care | Payer: 59 | Source: Ambulatory Visit | Attending: Internal Medicine | Admitting: Internal Medicine

## 2013-12-15 VITALS — BP 120/72 | HR 86 | Temp 97.8°F | Ht 71.5 in | Wt 142.0 lb

## 2013-12-15 DIAGNOSIS — J90 Pleural effusion, not elsewhere classified: Secondary | ICD-10-CM

## 2013-12-15 NOTE — Progress Notes (Signed)
Quick Note:  Spoke with pt and notified of results per Dr. Wert. Pt verbalized understanding and denied any questions.  ______ 

## 2013-12-15 NOTE — Patient Instructions (Signed)
Please remember to go to the   x-ray department downstairs for your tests - we will call you with the results when they are available.  See calendar for specific medication instructions and bring it back for each and every office visit for every healthcare provider you see.  Without it,  you may not receive the best quality medical care that we feel you deserve.  You will note that the calendar groups together  your maintenance  medications that are timed at particular times of the day.  Think of this as your checklist for what your doctor has instructed you to do until your next evaluation to see what benefit  there is  to staying on a consistent group of medications intended to keep you well.  The other group at the bottom is entirely up to you to use as you see fit  for specific symptoms that may arise between visits that require you to treat them on an as needed basis.  Think of this as your action plan or "what if" list.   Separating the top medications from the bottom group is fundamental to providing you adequate care going forward.    Your problem appears to be entirely related to your heart so pulmonary follow up is as needed
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
## 2013-12-16 NOTE — Assessment & Plan Note (Signed)
-    BNP 509 10/16/13   > lasix 20 mg per day > improved 11/13/2013 so added aldactone 50 mg daily - See Echo 11/13/2013 c/w chf> referred to Cardiology > see [facility] eval 12/11/13   Almost completely resolved at present - concerned about using acei and aldactone but plan to f/u renal function within a week so rec no change rx and pulmonary f/u can be prn at this point    Each maintenance medication was reviewed in detail including most importantly the difference between maintenance and as needed and under what circumstances the prns are to be used. This was done in the context of a medication calendar review which provided the patient with a user-friendly unambiguous mechanism for medication administration and reconciliation and provides an action plan for all active problems. It is critical that this be shown to every doctor  for modification during the office visit if necessary so the patient can use it as a working document.

## 2013-12-18 ENCOUNTER — Other Ambulatory Visit: Payer: 59

## 2013-12-19 ENCOUNTER — Encounter: Payer: Self-pay | Admitting: Cardiology

## 2013-12-19 ENCOUNTER — Other Ambulatory Visit (INDEPENDENT_AMBULATORY_CARE_PROVIDER_SITE_OTHER): Payer: 59

## 2013-12-19 ENCOUNTER — Ambulatory Visit (HOSPITAL_COMMUNITY): Payer: 59 | Attending: Cardiology | Admitting: Cardiology

## 2013-12-19 DIAGNOSIS — R0989 Other specified symptoms and signs involving the circulatory and respiratory systems: Secondary | ICD-10-CM | POA: Insufficient documentation

## 2013-12-19 DIAGNOSIS — R0609 Other forms of dyspnea: Secondary | ICD-10-CM

## 2013-12-19 DIAGNOSIS — R0600 Dyspnea, unspecified: Secondary | ICD-10-CM

## 2013-12-19 LAB — HEPATIC FUNCTION PANEL
ALT: 23 U/L (ref 0–53)
AST: 18 U/L (ref 0–37)
Albumin: 4.2 g/dL (ref 3.5–5.2)
Alkaline Phosphatase: 69 U/L (ref 39–117)
BILIRUBIN DIRECT: 0 mg/dL (ref 0.0–0.3)
BILIRUBIN TOTAL: 0.5 mg/dL (ref 0.3–1.2)
Total Protein: 7.1 g/dL (ref 6.0–8.3)

## 2013-12-19 LAB — CBC WITH DIFFERENTIAL/PLATELET
BASOS PCT: 0.3 % (ref 0.0–3.0)
Basophils Absolute: 0 10*3/uL (ref 0.0–0.1)
Eosinophils Absolute: 0.2 10*3/uL (ref 0.0–0.7)
Eosinophils Relative: 3.8 % (ref 0.0–5.0)
HEMATOCRIT: 31 % — AB (ref 39.0–52.0)
Hemoglobin: 9.7 g/dL — ABNORMAL LOW (ref 13.0–17.0)
LYMPHS ABS: 1.2 10*3/uL (ref 0.7–4.0)
Lymphocytes Relative: 24.5 % (ref 12.0–46.0)
MCHC: 31.4 g/dL (ref 30.0–36.0)
MCV: 76 fl — AB (ref 78.0–100.0)
MONO ABS: 0.3 10*3/uL (ref 0.1–1.0)
Monocytes Relative: 6.9 % (ref 3.0–12.0)
Neutro Abs: 3.2 10*3/uL (ref 1.4–7.7)
Neutrophils Relative %: 64.5 % (ref 43.0–77.0)
Platelets: 310 10*3/uL (ref 150.0–400.0)
RBC: 4.08 Mil/uL — AB (ref 4.22–5.81)
RDW: 15 % — ABNORMAL HIGH (ref 11.5–14.6)
WBC: 5 10*3/uL (ref 4.5–10.5)

## 2013-12-19 LAB — BASIC METABOLIC PANEL
BUN: 27 mg/dL — AB (ref 6–23)
CO2: 28 mEq/L (ref 19–32)
CREATININE: 1.3 mg/dL (ref 0.4–1.5)
Calcium: 9.3 mg/dL (ref 8.4–10.5)
Chloride: 98 mEq/L (ref 96–112)
GFR: 76.51 mL/min (ref >60.00)
GLUCOSE: 187 mg/dL — AB (ref 70–99)
POTASSIUM: 4.5 meq/L (ref 3.5–5.1)
Sodium: 134 mEq/L — ABNORMAL LOW (ref 135–145)

## 2013-12-19 LAB — LIPID PANEL
CHOL/HDL RATIO: 2
Cholesterol: 137 mg/dL (ref 0–200)
HDL: 62 mg/dL (ref >39.00)
LDL Cholesterol: 55 mg/dL (ref 0–99)
Triglycerides: 100 mg/dL (ref 0.0–149.0)
VLDL: 20 mg/dL (ref 0.0–40.0)

## 2013-12-19 LAB — BRAIN NATRIURETIC PEPTIDE: Pro B Natriuretic peptide (BNP): 332 pg/mL — ABNORMAL HIGH (ref 0.0–100.0)

## 2013-12-19 NOTE — Progress Notes (Signed)
Carotid duplex completed 

## 2014-01-10 ENCOUNTER — Telehealth: Payer: Self-pay | Admitting: *Deleted

## 2014-01-10 NOTE — Telephone Encounter (Signed)
Regarding carotid dopplers Keith Guzman     pt aware of results

## 2014-01-24 ENCOUNTER — Ambulatory Visit (INDEPENDENT_AMBULATORY_CARE_PROVIDER_SITE_OTHER): Payer: 59 | Admitting: *Deleted

## 2014-01-24 ENCOUNTER — Encounter: Payer: Self-pay | Admitting: *Deleted

## 2014-01-24 ENCOUNTER — Other Ambulatory Visit: Payer: Self-pay | Admitting: Internal Medicine

## 2014-01-24 DIAGNOSIS — I739 Peripheral vascular disease, unspecified: Secondary | ICD-10-CM

## 2014-01-24 DIAGNOSIS — I10 Essential (primary) hypertension: Secondary | ICD-10-CM

## 2014-01-24 DIAGNOSIS — I5022 Chronic systolic (congestive) heart failure: Secondary | ICD-10-CM

## 2014-01-24 DIAGNOSIS — I428 Other cardiomyopathies: Secondary | ICD-10-CM

## 2014-01-24 DIAGNOSIS — E785 Hyperlipidemia, unspecified: Secondary | ICD-10-CM

## 2014-01-24 DIAGNOSIS — I420 Dilated cardiomyopathy: Secondary | ICD-10-CM

## 2014-01-24 LAB — HEPATIC FUNCTION PANEL
ALBUMIN: 3.8 g/dL (ref 3.5–5.2)
ALK PHOS: 59 U/L (ref 39–117)
ALT: 24 U/L (ref 0–53)
AST: 17 U/L (ref 0–37)
BILIRUBIN TOTAL: 0.4 mg/dL (ref 0.3–1.2)
Bilirubin, Direct: 0.1 mg/dL (ref 0.0–0.3)
Total Protein: 7.1 g/dL (ref 6.0–8.3)

## 2014-01-24 LAB — LIPID PANEL
CHOL/HDL RATIO: 2
Cholesterol: 95 mg/dL (ref 0–200)
HDL: 54.8 mg/dL (ref >39.00)
LDL CALC: 26 mg/dL (ref 0–99)
TRIGLYCERIDES: 70 mg/dL (ref 0.0–149.0)
VLDL: 14 mg/dL (ref 0.0–40.0)

## 2014-01-25 ENCOUNTER — Ambulatory Visit (INDEPENDENT_AMBULATORY_CARE_PROVIDER_SITE_OTHER): Payer: 59 | Admitting: Cardiology

## 2014-01-25 ENCOUNTER — Encounter: Payer: Self-pay | Admitting: Cardiology

## 2014-01-25 VITALS — BP 94/50 | HR 70 | Ht 71.5 in | Wt 136.1 lb

## 2014-01-25 DIAGNOSIS — I429 Cardiomyopathy, unspecified: Secondary | ICD-10-CM

## 2014-01-25 DIAGNOSIS — D649 Anemia, unspecified: Secondary | ICD-10-CM

## 2014-01-25 DIAGNOSIS — I739 Peripheral vascular disease, unspecified: Secondary | ICD-10-CM

## 2014-01-25 DIAGNOSIS — I428 Other cardiomyopathies: Secondary | ICD-10-CM

## 2014-01-25 MED ORDER — SPIRONOLACTONE 50 MG PO TABS: 25.0000 mg | ORAL_TABLET | Freq: Every day | ORAL | Status: DC

## 2014-01-25 NOTE — Assessment & Plan Note (Signed)
Continue aspirin and statin. I will arrange followup with Dr. Allyson Sabal.

## 2014-01-25 NOTE — Patient Instructions (Signed)
Your physician wants you to follow-up in: 3 MONTHS WITH DR Jens Som You will receive a reminder letter in the mail two months in advance. If you don't receive a letter, please call our office to schedule the follow-up appointment.   DECREASE SPIRONOLACTONE TO 25 MG ONCE DAILY= 1/2 OF 50 MG TABLET  Your physician recommends that you return for lab work in: ONE WEEK  FOLLOW UP WITH DR BERRY FOR PVD  REFERRAL TO GI FOR ANEMIA

## 2014-01-25 NOTE — Assessment & Plan Note (Signed)
Blood pressure controlled. Continue present medications. 

## 2014-01-25 NOTE — Assessment & Plan Note (Signed)
I will arrange a GI evaluation for microcytic anemia.

## 2014-01-25 NOTE — Assessment & Plan Note (Signed)
Continue ACE inhibitor and beta blocker. I cannot advance medications as his blood pressure is borderline. Continue present dose of Lasix. Decrease spironolactone to 25 mg daily. Check potassium and renal function in one week. Begin discussed cardiac catheterization to rule out coronary disease as a cause of his cardiomyopathy. He is hesitant. He will agree to a nuclear study to screen for coronary disease. If abnormal he would agree to catheterization at that point. Repeat echocardiogram in 3 months. Hopefully LV function will have improved with medical therapy. If not we will need to consider ICD.

## 2014-01-25 NOTE — Assessment & Plan Note (Signed)
Continue statin. 
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
## 2014-01-26 ENCOUNTER — Encounter: Payer: Self-pay | Admitting: Gastroenterology

## 2014-02-02 ENCOUNTER — Other Ambulatory Visit (INDEPENDENT_AMBULATORY_CARE_PROVIDER_SITE_OTHER): Payer: 59

## 2014-02-02 DIAGNOSIS — I429 Cardiomyopathy, unspecified: Secondary | ICD-10-CM

## 2014-02-02 DIAGNOSIS — I428 Other cardiomyopathies: Secondary | ICD-10-CM

## 2014-02-02 LAB — BASIC METABOLIC PANEL
BUN: 15 mg/dL (ref 6–23)
CHLORIDE: 100 meq/L (ref 96–112)
CO2: 31 meq/L (ref 19–32)
Calcium: 9.6 mg/dL (ref 8.4–10.5)
Creatinine, Ser: 1.1 mg/dL (ref 0.4–1.5)
GFR: 89.45 mL/min (ref >60.00)
Glucose, Bld: 235 mg/dL — ABNORMAL HIGH (ref 70–99)
Potassium: 4.6 mEq/L (ref 3.5–5.1)
SODIUM: 138 meq/L (ref 135–145)

## 2014-03-07 ENCOUNTER — Ambulatory Visit (INDEPENDENT_AMBULATORY_CARE_PROVIDER_SITE_OTHER): Payer: 59 | Admitting: Cardiovascular Disease

## 2014-03-07 ENCOUNTER — Encounter: Payer: Self-pay | Admitting: Cardiovascular Disease

## 2014-03-07 VITALS — BP 116/64 | HR 93 | Ht 71.5 in | Wt 136.2 lb

## 2014-03-07 DIAGNOSIS — I739 Peripheral vascular disease, unspecified: Secondary | ICD-10-CM

## 2014-03-07 NOTE — Patient Instructions (Signed)
Your physician wants you to follow-up in: 1 year with Dr Berry. You will receive a reminder letter in the mail two months in advance. If you don't receive a letter, please call our office to schedule the follow-up appointment.  

## 2014-03-07 NOTE — Progress Notes (Signed)
03/07/2014 [facility]   August 14, 1961  [id number]  Primary Physician Laurell JosephsMorrow, Aaron P, MD Primary Cardiologist: Runell GessJonathan J. Ednah Hammock MD Roseanne RenoFACP,FACC,FAHA, FSCAI   HPI:  The patient is a 53 year old, thin-appearing, married [place] male, father of 2, grandfather to 1 grandchild who is currently out of work and was referred by Dr. Jimmey RalphParker originally for critical limb ischemia. He is accompanied by his wife today. He has had a heel ulcer probably related to trauma that was managed by Dr. Jimmey RalphParker. Venous Dopplers were negative and arterial Dopplers showed a right ABI of 1 and a left of 0.59 with high-grade popliteal and tibial stenosis. I angiogram'd him November 29, 2012, revealing an 80% distal left SFA stenosis in the adductor canal, 90% below-the-knee popiteal stenosis extending the tibioperoneal trunk with an occluded anterior tibial. I did directional atherectomy of his distal SFA and AngioSculpt cutting balloon angioplasty of the popliteal and tibioperoneal trunk with an excellent result. His ABI did increase from 0.5 range to 0.75. He ultimately developed an ulcer on his left great toe and underwent transmetatarsal amputation by Dr. Aldean BakerMarcus Duda which he and Dr. Jimmey RalphParker are followed.   I last saw him in the office 12/23/12. He is recently followup with Dr. Jens Somrenshaw for his cardiovascular care. Apparently he has an ejection fraction in the 20% range and cardiac catheterization was recommended. He does have a microcytic anemia which is currently being worked up.  Current Outpatient Prescriptions  Medication Sig Dispense Refill  . acetaminophen (TYLENOL) 500 MG tablet Per bottle as needed for pain      . atorvastatin (LIPITOR) 80 MG tablet Take 1 tablet (80 mg total) by mouth daily.  90 tablet  3  . carvedilol (COREG) 6.25 MG tablet Take 1 tablet (6.25 mg total) by mouth 2 (two) times daily.  180 tablet  3  . dextromethorphan-guaiFENesin (MUCINEX DM) 30-600 MG per 12 hr tablet Take 1-2 tablets by  mouth every 12 (twelve) hours as needed (cough/congestion).      . ferrous sulfate 325 (65 FE) MG tablet Take 325 mg by mouth daily with breakfast.      . furosemide (LASIX) 20 MG tablet Take 20 mg by mouth daily.      Marland Kitchen. lisinopril (PRINIVIL,ZESTRIL) 5 MG tablet Take 1 tablet (5 mg total) by mouth daily.  90 tablet  3  . metFORMIN (GLUCOPHAGE) 500 MG tablet 1 tab by mouth in the morning and 2 at bedtime      . Multiple Vitamin (MULTIVITAMIN WITH MINERALS) TABS Take 1 tablet by mouth every other day.       . spironolactone (ALDACTONE) 50 MG tablet Take 0.5 tablets (25 mg total) by mouth daily.  30 tablet  12   No current facility-administered medications for this visit.    No Known Allergies  History   Social History  . Marital Status: Married    Spouse Name: N/A    Number of Children: 2  . Years of Education: N/A   Occupational History  .     Social History Main Topics  . Smoking status: Former Smoker -- 1.00 packs/day for 35 years    Types: Cigarettes    Quit date: 12/01/2003  . Smokeless tobacco: Never Used  . Alcohol Use: No  . Drug Use: No  . Sexual Activity: Yes   Other Topics Concern  . Not on file   Social History Narrative  . No narrative on file     Review of Systems:  General: negative for chills, fever, night sweats or weight changes.  Cardiovascular: negative for chest pain, dyspnea on exertion, edema, orthopnea, palpitations, paroxysmal nocturnal dyspnea or shortness of breath Dermatological: negative for rash Respiratory: negative for cough or wheezing Urologic: negative for hematuria Abdominal: negative for nausea, vomiting, diarrhea, bright red blood per rectum, melena, or hematemesis Neurologic: negative for visual changes, syncope, or dizziness All other systems reviewed and are otherwise negative except as noted above.    Blood pressure 116/64, pulse 93, height 5' 11.5" (1.816 m), weight 136 lb 3.2 oz (61.78 kg).  General appearance: alert and no  distress Neck: no adenopathy, no carotid bruit, no JVD, supple, symmetrical, trachea midline and thyroid not enlarged, symmetric, no tenderness/mass/nodules Lungs: clear to auscultation bilaterally Heart: regular rate and rhythm, S1, S2 normal, no murmur, click, rub or gallop Extremities: extremities normal, atraumatic, no cyanosis or edema and healed left transmetatarsal amputation  EKG not performed today  ASSESSMENT AND PLAN:   PVD, LSFA PTA 11/29/12 Status post left SFA, left popliteal and tibioperoneal trunk intervention by myself 11/01/12 for critical limb ischemia. The patient was initially referred to me by Dr. Ardath Sax at [facility] wound care center. He simply underwent transmetatarsal" by Dr. Lajoyce Corners. The procedure was done for limb salvage and ultimately prevented him from having a BKA. The wound has healed nicely. He denies claudication. His last Doppler studies performed 10//14 revealed ABIs of 0.9 on the right and 0.9 for the left. He had a moderately high-grade stenosis in his left popliteal and a patent tibial peroneal trunk.      Runell Gess MD FACP,FACC,FAHA, [facility] 03/07/2014 12:51 PM

## 2014-03-07 NOTE — Assessment & Plan Note (Signed)
Status post left SFA, left popliteal and tibioperoneal trunk intervention by myself 11/01/12 for critical limb ischemia. The patient was initially referred to me by Dr. Ardath Sax at [facility] wound care center. He simply underwent transmetatarsal" by Dr. Lajoyce Corners. The procedure was done for limb salvage and ultimately prevented him from having a BKA. The wound has healed nicely. He denies claudication. His last Doppler studies performed 10//14 revealed ABIs of 0.9 on the right and 0.9 for the left. He had a moderately high-grade stenosis in his left popliteal and a patent tibial peroneal trunk.

## 2014-03-21 ENCOUNTER — Ambulatory Visit: Payer: 59 | Admitting: Gastroenterology

## 2014-03-26 ENCOUNTER — Other Ambulatory Visit: Payer: Self-pay | Admitting: Internal Medicine

## 2014-05-11 ENCOUNTER — Encounter: Payer: Self-pay | Admitting: *Deleted

## 2014-05-11 ENCOUNTER — Encounter: Payer: Self-pay | Admitting: Cardiology

## 2014-05-11 ENCOUNTER — Ambulatory Visit (INDEPENDENT_AMBULATORY_CARE_PROVIDER_SITE_OTHER): Payer: 59 | Admitting: Cardiology

## 2014-05-11 VITALS — BP 124/76 | HR 94 | Ht 71.5 in | Wt 145.3 lb

## 2014-05-11 DIAGNOSIS — I428 Other cardiomyopathies: Secondary | ICD-10-CM

## 2014-05-11 DIAGNOSIS — I5022 Chronic systolic (congestive) heart failure: Secondary | ICD-10-CM

## 2014-05-11 DIAGNOSIS — I420 Dilated cardiomyopathy: Secondary | ICD-10-CM

## 2014-05-11 LAB — BASIC METABOLIC PANEL WITH GFR
BUN: 16 mg/dL (ref 6–23)
CO2: 26 mEq/L (ref 19–32)
Calcium: 9.1 mg/dL (ref 8.4–10.5)
Chloride: 98 mEq/L (ref 96–112)
Creat: 1.13 mg/dL (ref 0.50–1.35)
GFR, Est African American: 84 mL/min
GFR, Est Non African American: 73 mL/min
GLUCOSE: 280 mg/dL — AB (ref 70–99)
POTASSIUM: 4.6 meq/L (ref 3.5–5.3)
SODIUM: 136 meq/L (ref 135–145)

## 2014-05-11 MED ORDER — ASPIRIN EC 81 MG PO TBEC: 81.0000 mg | DELAYED_RELEASE_TABLET | Freq: Every day | ORAL | Status: AC

## 2014-05-11 NOTE — Assessment & Plan Note (Signed)
Continue statin. 

## 2014-05-11 NOTE — Patient Instructions (Addendum)
Your physician recommends that you schedule a follow-up appointment in: 3 MONTHS WITH DR Jens Som  Your physician has requested that you have a lexiscan myoview. For further information please visit [email or web address] Please follow instruction sheet, as given.   Your physician has requested that you have an echocardiogram. Echocardiography is a painless test that uses sound waves to create images of your heart. It provides your doctor with information about the size and shape of your heart and how well your heart's chambers and valves are working. This procedure takes approximately one hour. There are no restrictions for this procedure.   Your physician recommends that you HAVE LAB WORK TODAY   START ASPIRIN 81 MG ENTERIC COATED
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
## 2014-05-11 NOTE — Assessment & Plan Note (Signed)
Continue ACE inhibitor and beta blocker. He has some orthostatic symptoms and I am therefore hesitant to advance medications. Etiology of cardiomyopathy. He is hesitant to proceed with catheterization. He has agreed to nuclear study to screen for coronary disease. If abnormal he will agree to catheterization. Repeat echocardiogram to reassess LV function on medical therapy. May require ICD.

## 2014-05-11 NOTE — Assessment & Plan Note (Signed)
Add enteric-coated aspirin 81 mg daily. Continue statin. Followed by Dr. Neta Ehlers.

## 2014-05-11 NOTE — Assessment & Plan Note (Signed)
Continue Lasix. Check potassium and renal function.

## 2014-05-11 NOTE — Assessment & Plan Note (Signed)
Blood pressure controlled. Continue present medications. Check potassium and renal function. 

## 2014-05-17 ENCOUNTER — Telehealth (HOSPITAL_COMMUNITY): Payer: Self-pay

## 2014-05-17 NOTE — Telephone Encounter (Signed)
Encounter complete. 

## 2014-05-22 ENCOUNTER — Ambulatory Visit (HOSPITAL_COMMUNITY)
Admission: RE | Admit: 2014-05-22 | Discharge: 2014-05-22 | Disposition: A | Discharge status: Home / Self Care | Payer: 59 | Source: Ambulatory Visit | Attending: Gynecology | Admitting: Gynecology

## 2014-05-22 ENCOUNTER — Ambulatory Visit (HOSPITAL_COMMUNITY)
Admission: RE | Admit: 2014-05-22 | Discharge: 2014-05-22 | Disposition: A | Discharge status: Home / Self Care | Payer: 59 | Source: Ambulatory Visit | Attending: Cardiology | Admitting: Cardiology

## 2014-05-22 DIAGNOSIS — I420 Dilated cardiomyopathy: Secondary | ICD-10-CM

## 2014-05-22 DIAGNOSIS — I517 Cardiomegaly: Secondary | ICD-10-CM

## 2014-05-22 DIAGNOSIS — I428 Other cardiomyopathies: Secondary | ICD-10-CM | POA: Insufficient documentation

## 2014-05-22 MED ORDER — TECHNETIUM TC 99M SESTAMIBI GENERIC - CARDIOLITE
10.0000 | Freq: Once | INTRAVENOUS | Status: AC | PRN
  Administered 2014-05-22: 10 via INTRAVENOUS

## 2014-05-22 MED ORDER — TECHNETIUM TC 99M SESTAMIBI GENERIC - CARDIOLITE
30.0000 | Freq: Once | INTRAVENOUS | Status: AC | PRN
  Administered 2014-05-22: 30 via INTRAVENOUS

## 2014-05-22 MED ORDER — REGADENOSON 0.4 MG/5ML IV SOLN
0.4000 mg | Freq: Once | INTRAVENOUS | Status: AC
  Administered 2014-05-22: 0.4 mg via INTRAVENOUS

## 2014-05-22 NOTE — Procedures (Addendum)
[place]  [facility] [address] [place] [address] [phone]  Cardiology Nuclear Med Study  Keith Guzman is a 53 y.o. male     MRN : [id number]     DOB: [birth year]  Procedure Date: 05/22/2014  Nuclear Med Background Indication for Stress Test:  Evaluation for Ischemia History:  CHF;No prior NUC MPI for comparison;ECHO in 10/2013-EF=20-25% Cardiac Risk Factors: History of Smoking, Hypertension, Lipids, NIDDM, PVD and Hx of Bil PE's.  Symptoms:  Pt denies all sy,ptoms at this time.   Nuclear Pre-Procedure Caffeine/Decaff Intake:  9:00pm NPO After: 7:00am   IV Site: R Antecubital  IV 0.9% NS with Angio Cath:  22g  Chest Size (in):  36"  IV Started by: Berdie Ogren, RN  Height: 5' 11.5" (1.816 m)  Cup Size: n/a  BMI:  Body mass index is 18.71 kg/(m^2). Weight:  136 lb (61.689 kg)   Tech Comments:  n/a    Nuclear Med Study 1 or 2 day study: 1 day  Stress Test Type:  Lexiscan  Order Authorizing Provider:  Olga Millers, MD   Resting Radionuclide: Technetium 59m Sestamibi  Resting Radionuclide Dose: 10.6 mCi   Stress Radionuclide:  Technetium 14m Sestamibi  Stress Radionuclide Dose: 30.5 mCi           Stress Protocol Rest HR:88 Stress HR:103  Rest BP: 162/97 Stress BP: 162/97  Exercise Time (min): n/a METS: n/a          Dose of Adenosine (mg):  n/a Dose of Lexiscan: 0.4 mg  Dose of Atropine (mg): n/a Dose of Dobutamine: n/a mcg/kg/min (at max HR)  Stress Test Technologist: Ernestene Mention, CCT Nuclear Technologist: Koren Shiver, CNMT   Rest Procedure:  Myocardial perfusion imaging was performed at rest 45 minutes following the intravenous administration of Technetium 60m Sestamibi. Stress Procedure:  The patient received IV Lexiscan 0.4 mg over 15-seconds.  Technetium 88m Sestamibi injected IV at 30-seconds.  There were no significant changes with Lexiscan.  Quantitative spect images were obtained after a 45 minute  delay.  Transient Ischemic Dilatation (Normal <1.22):  1.16 QGS EDV:  112 ml QGS ESV:  59 ml LV Ejection Fraction: 47%        Rest ECG: NSR, nonspecific ST changes.  Stress ECG: No significant ST segment change suggestive of ischemia.  QPS Raw Data Images:  There is interference from nuclear activity from structures below the diaphragm. This does not affect the ability to read the study. Stress Images:  Normal homogeneous uptake in all areas of the myocardium. Rest Images:  Normal homogeneous uptake in all areas of the myocardium. Subtraction (SDS):  No evidence of ischemia.  Impression Exercise Capacity:  Lexiscan with no exercise. BP Response:  Normal blood pressure response. Clinical Symptoms:  No chest pain or dyspnea. ECG Impression:  No significant ST segment change suggestive of ischemia. Comparison with Prior Nuclear Study: No previous nuclear study performed  Overall Impression:  Low risk stress nuclear study with no ischemia or infarction.  LV Wall Motion:  Wall motion appears normal; LV function appears better than calculated EF; suggest echo to further assess.   Olga Millers, MD  05/22/2014 12:58 PM

## 2014-05-22 NOTE — Progress Notes (Signed)
2D Echocardiogram Complete.  05/22/2014   Anyi Fels, RDCS  

## 2014-06-13 ENCOUNTER — Other Ambulatory Visit: Payer: Self-pay

## 2014-06-13 ENCOUNTER — Telehealth: Payer: Self-pay | Admitting: Cardiology

## 2014-06-13 MED ORDER — ATORVASTATIN CALCIUM 80 MG PO TABS: 80.0000 mg | ORAL_TABLET | Freq: Every day | ORAL | Status: DC

## 2014-06-13 NOTE — Telephone Encounter (Signed)
Pt need his Carvedilol called in today please. Pt have been waiting for it. Please call his Carvedilol 6.25 mg #60 to  [phone].

## 2014-06-19 ENCOUNTER — Other Ambulatory Visit: Payer: Self-pay

## 2014-06-19 MED ORDER — CARVEDILOL 6.25 MG PO TABS: 6.2500 mg | ORAL_TABLET | Freq: Two times a day (BID) | ORAL | Status: DC

## 2014-07-11 ENCOUNTER — Other Ambulatory Visit: Payer: Self-pay | Admitting: Cardiology
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
## 2014-08-17 ENCOUNTER — Ambulatory Visit (INDEPENDENT_AMBULATORY_CARE_PROVIDER_SITE_OTHER): Payer: 59 | Admitting: Cardiology

## 2014-08-17 ENCOUNTER — Encounter: Payer: Self-pay | Admitting: Cardiology

## 2014-08-17 VITALS — BP 140/80 | HR 98 | Ht 71.5 in | Wt 151.8 lb

## 2014-08-17 DIAGNOSIS — I420 Dilated cardiomyopathy: Secondary | ICD-10-CM

## 2014-08-17 DIAGNOSIS — I10 Essential (primary) hypertension: Secondary | ICD-10-CM

## 2014-08-17 DIAGNOSIS — E785 Hyperlipidemia, unspecified: Secondary | ICD-10-CM

## 2014-08-17 LAB — BASIC METABOLIC PANEL WITH GFR
BUN: 13 mg/dL (ref 6–23)
CALCIUM: 9.7 mg/dL (ref 8.4–10.5)
CO2: 28 mEq/L (ref 19–32)
Chloride: 100 mEq/L (ref 96–112)
Creat: 1.14 mg/dL (ref 0.50–1.35)
GFR, Est African American: 83 mL/min
GFR, Est Non African American: 72 mL/min
GLUCOSE: 186 mg/dL — AB (ref 70–99)
Potassium: 4.3 mEq/L (ref 3.5–5.3)
SODIUM: 138 meq/L (ref 135–145)

## 2014-08-17 NOTE — Assessment & Plan Note (Signed)
Continue aspirin and statin. 

## 2014-08-17 NOTE — Assessment & Plan Note (Signed)
Continue present blood pressure medications. 

## 2014-08-17 NOTE — Assessment & Plan Note (Signed)
Euvolemic on examination. Continue present dose of diuretics. Check potassium and renal function. 

## 2014-08-17 NOTE — Assessment & Plan Note (Signed)
Continue ACE inhibitor and beta blocker. He is complaining of continuous dizziness that is not positional. I'm not convinced this is related to his blood pressure medications. I will not advance at this point. I have asked him to track his blood pressure at home to see if there is a correlation between dizziness and blood pressure.

## 2014-08-17 NOTE — Patient Instructions (Signed)
Your physician recommends that you schedule a follow-up appointment in: 3 MONTHS WITH DR CRENSHAW  Your physician recommends that you HAVE LAB WORK TODAY  

## 2014-08-17 NOTE — Assessment & Plan Note (Signed)
Continue statin. 

## 2014-08-20 ENCOUNTER — Telehealth: Payer: Self-pay | Admitting: Hematology

## 2014-08-20 NOTE — Telephone Encounter (Signed)
Lt mess for pt for future appt on 09/25/14 at 1:30pm

## 2014-08-21 ENCOUNTER — Telehealth: Payer: Self-pay | Admitting: Hematology

## 2014-08-21 NOTE — Telephone Encounter (Signed)
C/D ON 11/24 FOR NP APPT ON 12/03  °

## 2014-08-22 ENCOUNTER — Other Ambulatory Visit: Payer: Self-pay | Admitting: Cardiology

## 2014-08-22 NOTE — Telephone Encounter (Signed)
Rx has been sent to the pharmacy electronically. ° °

## 2014-08-30 ENCOUNTER — Encounter: Payer: 59 | Admitting: Hematology

## 2014-08-30 ENCOUNTER — Ambulatory Visit: Payer: 59

## 2014-08-30 ENCOUNTER — Telehealth: Payer: Self-pay | Admitting: Hematology

## 2014-08-30 NOTE — Telephone Encounter (Signed)
PATIENT WIFE CALLED TO R/S NP APPT TO 12/29 @ 10:30 DUE TO WORK SCHEDULE.

## 2014-08-30 NOTE — Progress Notes (Signed)
This encounter was created in error - please disregard.

## 2014-09-06 ENCOUNTER — Encounter (HOSPITAL_COMMUNITY): Payer: Self-pay | Admitting: Cardiovascular Disease

## 2014-09-18 ENCOUNTER — Other Ambulatory Visit: Payer: Self-pay | Admitting: Cardiology

## 2014-09-25 ENCOUNTER — Telehealth: Payer: Self-pay | Admitting: Hematology

## 2014-09-25 ENCOUNTER — Ambulatory Visit: Payer: 59 | Admitting: Hematology

## 2014-09-25 ENCOUNTER — Ambulatory Visit (HOSPITAL_BASED_OUTPATIENT_CLINIC_OR_DEPARTMENT_OTHER): Payer: 59 | Admitting: Hematology

## 2014-09-25 ENCOUNTER — Ambulatory Visit: Payer: 59

## 2014-09-25 ENCOUNTER — Encounter: Payer: Self-pay | Admitting: Hematology

## 2014-09-25 ENCOUNTER — Ambulatory Visit (HOSPITAL_BASED_OUTPATIENT_CLINIC_OR_DEPARTMENT_OTHER): Payer: 59

## 2014-09-25 VITALS — BP 154/81 | HR 96 | Temp 98.2°F | Resp 18 | Ht 71.5 in | Wt 156.4 lb

## 2014-09-25 DIAGNOSIS — D509 Iron deficiency anemia, unspecified: Secondary | ICD-10-CM

## 2014-09-25 DIAGNOSIS — D649 Anemia, unspecified: Secondary | ICD-10-CM

## 2014-09-25 DIAGNOSIS — I10 Essential (primary) hypertension: Secondary | ICD-10-CM

## 2014-09-25 DIAGNOSIS — E119 Type 2 diabetes mellitus without complications: Secondary | ICD-10-CM

## 2014-09-25 LAB — COMPREHENSIVE METABOLIC PANEL (CC13)
ALT: 57 U/L — ABNORMAL HIGH (ref 0–55)
ANION GAP: 9 meq/L (ref 3–11)
AST: 26 U/L (ref 5–34)
Albumin: 3.8 g/dL (ref 3.5–5.0)
Alkaline Phosphatase: 144 U/L (ref 40–150)
BUN: 19.8 mg/dL (ref 7.0–26.0)
CHLORIDE: 100 meq/L (ref 98–109)
CO2: 27 meq/L (ref 22–29)
CREATININE: 1.6 mg/dL — AB (ref 0.7–1.3)
Calcium: 9.3 mg/dL (ref 8.4–10.4)
EGFR: 57 mL/min/{1.73_m2} — ABNORMAL LOW (ref >90)
GLUCOSE: 409 mg/dL — AB (ref 70–140)
Potassium: 4.6 mEq/L (ref 3.5–5.1)
Sodium: 136 mEq/L (ref 136–145)
Total Bilirubin: 0.34 mg/dL (ref 0.20–1.20)
Total Protein: 7.3 g/dL (ref 6.4–8.3)

## 2014-09-25 LAB — CBC & DIFF AND RETIC
BASO%: 0.2 % (ref 0.0–2.0)
BASOS ABS: 0 10*3/uL (ref 0.0–0.1)
EOS ABS: 0.2 10*3/uL (ref 0.0–0.5)
EOS%: 4.3 % (ref 0.0–7.0)
HEMATOCRIT: 27.6 % — AB (ref 38.4–49.9)
HEMOGLOBIN: 8.5 g/dL — AB (ref 13.0–17.1)
IMMATURE RETIC FRACT: 3.4 % (ref 3.00–10.60)
LYMPH%: 20.3 % (ref 14.0–49.0)
MCH: 24.1 pg — ABNORMAL LOW (ref 27.2–33.4)
MCHC: 30.8 g/dL — ABNORMAL LOW (ref 32.0–36.0)
MCV: 78.4 fL — ABNORMAL LOW (ref 79.3–98.0)
MONO#: 0.4 10*3/uL (ref 0.1–0.9)
MONO%: 7.2 % (ref 0.0–14.0)
NEUT%: 68 % (ref 39.0–75.0)
NEUTROS ABS: 3.5 10*3/uL (ref 1.5–6.5)
Platelets: 259 10*3/uL (ref 140–400)
RBC: 3.52 10*6/uL — ABNORMAL LOW (ref 4.20–5.82)
RDW: 13.2 % (ref 11.0–14.6)
RETIC CT ABS: 40.48 10*3/uL (ref 34.80–93.90)
Retic %: 1.15 % (ref 0.80–1.80)
WBC: 5.2 10*3/uL (ref 4.0–10.3)
lymph#: 1.1 10*3/uL (ref 0.9–3.3)

## 2014-09-25 LAB — IRON AND TIBC CHCC
%SAT: 29 % (ref 20–55)
IRON: 80 ug/dL (ref 42–163)
TIBC: 280 ug/dL (ref 202–409)
UIBC: 200 ug/dL (ref 117–376)

## 2014-09-25 LAB — LACTATE DEHYDROGENASE (CC13): LDH: 211 U/L (ref 125–245)

## 2014-09-25 LAB — MORPHOLOGY: PLT EST: ADEQUATE

## 2014-09-25 LAB — FERRITIN CHCC: Ferritin: 478 ng/ml — ABNORMAL HIGH (ref 22–316)

## 2014-09-25 NOTE — Telephone Encounter (Signed)
Pt confirmed labs/ov per 12/29 POF, gave pt AVS.... KJ, added labs today

## 2014-09-25 NOTE — Progress Notes (Signed)
Checked in new pt with no financial concerns at this time.  Pt states he's here for a hematology concern so financial assistance may not be needed but he has Raquel's card for any billing or insurance questions or concerns.
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
## 2014-09-27 LAB — SPEP & IFE WITH QIG
Albumin ELP: 56.6 % (ref 55.8–66.1)
Alpha-1-Globulin: 4.2 % (ref 2.9–4.9)
Alpha-2-Globulin: 10.9 % (ref 7.1–11.8)
BETA 2: 5.7 % (ref 3.2–6.5)
Beta Globulin: 6.3 % (ref 4.7–7.2)
Gamma Globulin: 16.3 % (ref 11.1–18.8)
IGA: 410 mg/dL — AB (ref 68–379)
IGM, SERUM: 60 mg/dL (ref 41–251)
IgG (Immunoglobin G), Serum: 1120 mg/dL (ref 650–1600)
TOTAL PROTEIN, SERUM ELECTROPHOR: 7.1 g/dL (ref 6.0–8.3)

## 2014-09-27 LAB — VITAMIN B12: VITAMIN B 12: 1246 pg/mL — AB (ref 211–911)

## 2014-09-27 LAB — HEAVY METALS, BLOOD
Arsenic: <3 mcg/L (ref <23)
Mercury, B: <4 mcg/L (ref <=10)

## 2014-09-27 LAB — HEMOGLOBINOPATHY EVALUATION
HGB A: 97.3 % (ref 96.8–97.8)
Hemoglobin Other: 0 %
Hgb A2 Quant: 2.7 % (ref 2.2–3.2)
Hgb F Quant: 0 % (ref 0.0–2.0)
Hgb S Quant: 0 %

## 2014-09-27 LAB — KAPPA/LAMBDA LIGHT CHAINS
KAPPA LAMBDA RATIO: 1.65 (ref 0.26–1.65)
Kappa free light chain: 5.81 mg/dL — ABNORMAL HIGH (ref 0.33–1.94)
LAMBDA FREE LGHT CHN: 3.52 mg/dL — AB (ref 0.57–2.63)

## 2014-09-27 LAB — FOLATE RBC: RBC Folate: 1283 ng/mL (ref >280)

## 2014-09-27 LAB — ERYTHROPOIETIN: ERYTHROPOIETIN: 6 m[IU]/mL (ref 2.6–18.5)

## 2014-09-27 LAB — HAPTOGLOBIN: Haptoglobin: 139 mg/dL (ref 45–215)

## 2014-09-28 ENCOUNTER — Encounter: Payer: Self-pay | Admitting: Hematology

## 2014-10-07 ENCOUNTER — Other Ambulatory Visit: Payer: Self-pay | Admitting: Internal Medicine

## 2014-10-10 ENCOUNTER — Other Ambulatory Visit: Payer: Self-pay | Admitting: Internal Medicine

## 2014-10-17 ENCOUNTER — Encounter: Payer: Self-pay | Admitting: Hematology

## 2014-10-17 ENCOUNTER — Other Ambulatory Visit (HOSPITAL_BASED_OUTPATIENT_CLINIC_OR_DEPARTMENT_OTHER): Payer: 59

## 2014-10-17 ENCOUNTER — Telehealth: Payer: Self-pay | Admitting: *Deleted

## 2014-10-17 ENCOUNTER — Telehealth: Payer: Self-pay | Admitting: Hematology

## 2014-10-17 ENCOUNTER — Ambulatory Visit (HOSPITAL_BASED_OUTPATIENT_CLINIC_OR_DEPARTMENT_OTHER): Payer: 59 | Admitting: Hematology

## 2014-10-17 VITALS — BP 131/69 | HR 85 | Temp 98.1°F | Resp 18 | Ht 71.5 in | Wt 157.6 lb

## 2014-10-17 DIAGNOSIS — D509 Iron deficiency anemia, unspecified: Secondary | ICD-10-CM

## 2014-10-17 DIAGNOSIS — I739 Peripheral vascular disease, unspecified: Secondary | ICD-10-CM

## 2014-10-17 DIAGNOSIS — N189 Chronic kidney disease, unspecified: Secondary | ICD-10-CM

## 2014-10-17 DIAGNOSIS — I129 Hypertensive chronic kidney disease with stage 1 through stage 4 chronic kidney disease, or unspecified chronic kidney disease: Secondary | ICD-10-CM

## 2014-10-17 DIAGNOSIS — D638 Anemia in other chronic diseases classified elsewhere: Secondary | ICD-10-CM | POA: Insufficient documentation

## 2014-10-17 DIAGNOSIS — D649 Anemia, unspecified: Secondary | ICD-10-CM

## 2014-10-17 LAB — CBC & DIFF AND RETIC
BASO%: 0.2 % (ref 0.0–2.0)
Basophils Absolute: 0 10*3/uL (ref 0.0–0.1)
EOS%: 3.2 % (ref 0.0–7.0)
Eosinophils Absolute: 0.2 10*3/uL (ref 0.0–0.5)
HCT: 27.5 % — ABNORMAL LOW (ref 38.4–49.9)
HEMOGLOBIN: 8.6 g/dL — AB (ref 13.0–17.1)
IMMATURE RETIC FRACT: 3.3 % (ref 3.00–10.60)
LYMPH%: 17.6 % (ref 14.0–49.0)
MCH: 24.4 pg — ABNORMAL LOW (ref 27.2–33.4)
MCHC: 31.3 g/dL — ABNORMAL LOW (ref 32.0–36.0)
MCV: 77.9 fL — AB (ref 79.3–98.0)
MONO#: 0.6 10*3/uL (ref 0.1–0.9)
MONO%: 10.3 % (ref 0.0–14.0)
NEUT%: 68.7 % (ref 39.0–75.0)
NEUTROS ABS: 3.9 10*3/uL (ref 1.5–6.5)
Platelets: 229 10*3/uL (ref 140–400)
RBC: 3.53 10*6/uL — AB (ref 4.20–5.82)
RDW: 13 % (ref 11.0–14.6)
Retic %: 1.05 % (ref 0.80–1.80)
Retic Ct Abs: 37.07 10*3/uL (ref 34.80–93.90)
WBC: 5.6 10*3/uL (ref 4.0–10.3)
lymph#: 1 10*3/uL (ref 0.9–3.3)

## 2014-10-17 MED ORDER — DARBEPOETIN ALFA 300 MCG/0.6ML IJ SOSY
300.0000 ug | PREFILLED_SYRINGE | Freq: Once | INTRAMUSCULAR | Status: DC
  Filled 2014-10-17: qty 0.6

## 2014-10-17 NOTE — Telephone Encounter (Signed)
Gave avs & calendar for January/February. °

## 2014-10-17 NOTE — Telephone Encounter (Signed)
Ok to DC lasix and spironolactone; call if increased SOB, weight gain or edema.     Keith Guzman        ----- Message -----     From: Malachy Mood, MD     Sent: 10/17/2014 11:04 AM      To: Lewayne Bunting, MD
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
## 2014-10-17 NOTE — Telephone Encounter (Signed)
Spoke with pt wife, Aware of dr crenshaw's recommendations.  

## 2014-10-17 NOTE — Telephone Encounter (Signed)
Pt is returning Debra's call .  °

## 2014-10-22 ENCOUNTER — Other Ambulatory Visit (HOSPITAL_BASED_OUTPATIENT_CLINIC_OR_DEPARTMENT_OTHER): Payer: 59

## 2014-10-22 ENCOUNTER — Ambulatory Visit (HOSPITAL_BASED_OUTPATIENT_CLINIC_OR_DEPARTMENT_OTHER): Payer: 59

## 2014-10-22 DIAGNOSIS — N189 Chronic kidney disease, unspecified: Secondary | ICD-10-CM

## 2014-10-22 DIAGNOSIS — D631 Anemia in chronic kidney disease: Secondary | ICD-10-CM

## 2014-10-22 DIAGNOSIS — D638 Anemia in other chronic diseases classified elsewhere: Secondary | ICD-10-CM

## 2014-10-22 DIAGNOSIS — D649 Anemia, unspecified: Secondary | ICD-10-CM

## 2014-10-22 LAB — CBC & DIFF AND RETIC
BASO%: 0.2 % (ref 0.0–2.0)
Basophils Absolute: 0 10*3/uL (ref 0.0–0.1)
EOS%: 3.4 % (ref 0.0–7.0)
Eosinophils Absolute: 0.2 10*3/uL (ref 0.0–0.5)
HCT: 27.2 % — ABNORMAL LOW (ref 38.4–49.9)
HGB: 8.2 g/dL — ABNORMAL LOW (ref 13.0–17.1)
Immature Retic Fract: 2.6 % — ABNORMAL LOW (ref 3.00–10.60)
LYMPH%: 20.9 % (ref 14.0–49.0)
MCH: 23.8 pg — ABNORMAL LOW (ref 27.2–33.4)
MCHC: 30.1 g/dL — AB (ref 32.0–36.0)
MCV: 79.1 fL — ABNORMAL LOW (ref 79.3–98.0)
MONO#: 0.5 10*3/uL (ref 0.1–0.9)
MONO%: 9.1 % (ref 0.0–14.0)
NEUT%: 66.4 % (ref 39.0–75.0)
NEUTROS ABS: 3.9 10*3/uL (ref 1.5–6.5)
PLATELETS: 233 10*3/uL (ref 140–400)
RBC: 3.44 10*6/uL — ABNORMAL LOW (ref 4.20–5.82)
RDW: 12.9 % (ref 11.0–14.6)
RETIC CT ABS: 30.96 10*3/uL — AB (ref 34.80–93.90)
Retic %: 0.9 % (ref 0.80–1.80)
WBC: 5.8 10*3/uL (ref 4.0–10.3)
lymph#: 1.2 10*3/uL (ref 0.9–3.3)

## 2014-10-22 MED ORDER — DARBEPOETIN ALFA 300 MCG/0.6ML IJ SOSY
300.0000 ug | PREFILLED_SYRINGE | Freq: Once | INTRAMUSCULAR | Status: AC
  Administered 2014-10-22: 300 ug via SUBCUTANEOUS
  Filled 2014-10-22: qty 0.6

## 2014-10-22 NOTE — Patient Instructions (Signed)
Darbepoetin Alfa injection What is this medicine? DARBEPOETIN ALFA (dar be POE e tin AL fa) helps your body make more red blood cells. It is used to treat anemia caused by chronic kidney failure and chemotherapy. This medicine may be used for other purposes; ask your health care provider or pharmacist if you have questions. COMMON BRAND NAME(S): Aranesp What should I tell my health care provider before I take this medicine? They need to know if you have any of these conditions: -blood clotting disorders or history of blood clots -cancer patient not on chemotherapy -cystic fibrosis -heart disease, such as angina, heart failure, or a history of a heart attack -hemoglobin level of 12 g/dL or greater -high blood pressure -low levels of folate, iron, or vitamin B12 -seizures -an unusual or allergic reaction to darbepoetin, erythropoietin, albumin, hamster proteins, latex, other medicines, foods, dyes, or preservatives -pregnant or trying to get pregnant -breast-feeding How should I use this medicine? This medicine is for injection into a vein or under the skin. It is usually given by a health care professional in a hospital or clinic setting. If you get this medicine at home, you will be taught how to prepare and give this medicine. Do not shake the solution before you withdraw a dose. Use exactly as directed. Take your medicine at regular intervals. Do not take your medicine more often than directed. It is important that you put your used needles and syringes in a special sharps container. Do not put them in a trash can. If you do not have a sharps container, call your pharmacist or healthcare provider to get one. Talk to your pediatrician regarding the use of this medicine in children. While this medicine may be used in children as young as 1 year for selected conditions, precautions do apply. Overdosage: If you think you have taken too much of this medicine contact a poison control center or  emergency room at once. NOTE: This medicine is only for you. Do not share this medicine with others. What if I miss a dose? If you miss a dose, take it as soon as you can. If it is almost time for your next dose, take only that dose. Do not take double or extra doses. What may interact with this medicine? Do not take this medicine with any of the following medications: -epoetin alfa This list may not describe all possible interactions. Give your health care provider a list of all the medicines, herbs, non-prescription drugs, or dietary supplements you use. Also tell them if you smoke, drink alcohol, or use illegal drugs. Some items may interact with your medicine. What should I watch for while using this medicine? Visit your prescriber or health care professional for regular checks on your progress and for the needed blood tests and blood pressure measurements. It is especially important for the doctor to make sure your hemoglobin level is in the desired range, to limit the risk of potential side effects and to give you the best benefit. Keep all appointments for any recommended tests. Check your blood pressure as directed. Ask your doctor what your blood pressure should be and when you should contact him or her. As your body makes more red blood cells, you may need to take iron, folic acid, or vitamin B supplements. Ask your doctor or health care provider which products are right for you. If you have kidney disease continue dietary restrictions, even though this medication can make you feel better. Talk with your doctor or health   care professional about the foods you eat and the vitamins that you take. What side effects may I notice from receiving this medicine? Side effects that you should report to your doctor or health care professional as soon as possible: -allergic reactions like skin rash, itching or hives, swelling of the face, lips, or tongue -breathing problems -changes in vision -chest  pain -confusion, trouble speaking or understanding -feeling faint or lightheaded, falls -high blood pressure -muscle aches or pains -pain, swelling, warmth in the leg -rapid weight gain -severe headaches -sudden numbness or weakness of the face, arm or leg -trouble walking, dizziness, loss of balance or coordination -seizures (convulsions) -swelling of the ankles, feet, hands -unusually weak or tired Side effects that usually do not require medical attention (report to your doctor or health care professional if they continue or are bothersome): -diarrhea -fever, chills (flu-like symptoms) -headaches -nausea, vomiting -redness, stinging, or swelling at site where injected This list may not describe all possible side effects. Call your doctor for medical advice about side effects. You may report side effects to FDA at 1-800-FDA-1088. Where should I keep my medicine? Keep out of the reach of children. Store in a refrigerator between 2 and 8 degrees C (36 and 46 degrees F). Do not freeze. Do not shake. Throw away any unused portion if using a single-dose vial. Throw away any unused medicine after the expiration date. NOTE: This sheet is a summary. It may not cover all possible information. If you have questions about this medicine, talk to your doctor, pharmacist, or health care provider.  2015, Elsevier/Gold Standard. (2008-08-28 10:23:57)  

## 2014-11-02 ENCOUNTER — Other Ambulatory Visit: Payer: Self-pay

## 2014-11-02 MED ORDER — LISINOPRIL 5 MG PO TABS: 5.0000 mg | ORAL_TABLET | Freq: Every day | ORAL | Status: DC

## 2014-11-08 ENCOUNTER — Other Ambulatory Visit: Payer: Self-pay | Admitting: Internal Medicine

## 2014-11-15 ENCOUNTER — Other Ambulatory Visit (HOSPITAL_BASED_OUTPATIENT_CLINIC_OR_DEPARTMENT_OTHER): Payer: 59

## 2014-11-15 ENCOUNTER — Ambulatory Visit: Payer: 59

## 2014-11-15 ENCOUNTER — Encounter: Payer: Self-pay | Admitting: Hematology

## 2014-11-15 ENCOUNTER — Ambulatory Visit (HOSPITAL_BASED_OUTPATIENT_CLINIC_OR_DEPARTMENT_OTHER): Payer: 59 | Admitting: Hematology

## 2014-11-15 ENCOUNTER — Telehealth: Payer: Self-pay | Admitting: Hematology

## 2014-11-15 VITALS — BP 186/93 | HR 87 | Temp 98.7°F | Resp 18 | Ht 71.5 in | Wt 162.4 lb

## 2014-11-15 DIAGNOSIS — D631 Anemia in chronic kidney disease: Secondary | ICD-10-CM | POA: Diagnosis not present

## 2014-11-15 DIAGNOSIS — D638 Anemia in other chronic diseases classified elsewhere: Secondary | ICD-10-CM

## 2014-11-15 DIAGNOSIS — D649 Anemia, unspecified: Secondary | ICD-10-CM

## 2014-11-15 DIAGNOSIS — N189 Chronic kidney disease, unspecified: Secondary | ICD-10-CM

## 2014-11-15 LAB — CBC & DIFF AND RETIC
BASO%: 0.2 % (ref 0.0–2.0)
Basophils Absolute: 0 10*3/uL (ref 0.0–0.1)
EOS%: 2.5 % (ref 0.0–7.0)
Eosinophils Absolute: 0.1 10*3/uL (ref 0.0–0.5)
HCT: 34.9 % — ABNORMAL LOW (ref 38.4–49.9)
HGB: 10.6 g/dL — ABNORMAL LOW (ref 13.0–17.1)
IMMATURE RETIC FRACT: 1.9 % — AB (ref 3.00–10.60)
LYMPH%: 18.2 % (ref 14.0–49.0)
MCH: 24.1 pg — AB (ref 27.2–33.4)
MCHC: 30.4 g/dL — AB (ref 32.0–36.0)
MCV: 79.5 fL (ref 79.3–98.0)
MONO#: 0.6 10*3/uL (ref 0.1–0.9)
MONO%: 10.1 % (ref 0.0–14.0)
NEUT#: 3.9 10*3/uL (ref 1.5–6.5)
NEUT%: 69 % (ref 39.0–75.0)
PLATELETS: 202 10*3/uL (ref 140–400)
RBC: 4.39 10*6/uL (ref 4.20–5.82)
RDW: 14.1 % (ref 11.0–14.6)
RETIC CT ABS: 23.71 10*3/uL — AB (ref 34.80–93.90)
Retic %: 0.54 % — ABNORMAL LOW (ref 0.80–1.80)
WBC: 5.7 10*3/uL (ref 4.0–10.3)
lymph#: 1 10*3/uL (ref 0.9–3.3)

## 2014-11-15 NOTE — Progress Notes (Signed)
Not getting Aranesp today per Dr Mosetta Putt.   HGB 10.6 today
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
## 2014-11-15 NOTE — Telephone Encounter (Signed)
Pt confirmed labs/ov/inj per 02/18 POF, gave pt AVS.... KJ

## 2014-11-16 ENCOUNTER — Encounter: Payer: Self-pay | Admitting: Hematology
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
## 2014-11-21 ENCOUNTER — Ambulatory Visit (INDEPENDENT_AMBULATORY_CARE_PROVIDER_SITE_OTHER): Payer: 59 | Admitting: Cardiology

## 2014-11-21 ENCOUNTER — Encounter: Payer: Self-pay | Admitting: Cardiology

## 2014-11-21 VITALS — BP 158/84 | HR 86 | Ht 71.5 in | Wt 161.7 lb

## 2014-11-21 DIAGNOSIS — I739 Peripheral vascular disease, unspecified: Secondary | ICD-10-CM

## 2014-11-21 DIAGNOSIS — I420 Dilated cardiomyopathy: Secondary | ICD-10-CM

## 2014-11-21 DIAGNOSIS — I10 Essential (primary) hypertension: Secondary | ICD-10-CM

## 2014-11-21 DIAGNOSIS — E785 Hyperlipidemia, unspecified: Secondary | ICD-10-CM

## 2014-11-21 MED ORDER — CARVEDILOL 12.5 MG PO TABS: 12.5000 mg | ORAL_TABLET | Freq: Two times a day (BID) | ORAL | Status: DC

## 2014-11-21 MED ORDER — ATORVASTATIN CALCIUM 80 MG PO TABS: ORAL_TABLET | ORAL | Status: DC

## 2014-11-21 NOTE — Assessment & Plan Note (Signed)
LV function improved on most recent echo. Continue ACE inhibitor and beta blocker. Increase carvedilol to 12.5 mg by mouth twice a day.

## 2014-11-21 NOTE — Assessment & Plan Note (Signed)
Patient's diuretics have been discontinued. He is euvolemic on examination. We'll continue to monitor.

## 2014-11-21 NOTE — Patient Instructions (Signed)
Your physician wants you to follow-up in: 6 MONTHS WITH DR CRENSHAW You will receive a reminder letter in the mail two months in advance. If you don't receive a letter, please call our office to schedule the follow-up appointment.   INCREASE CARVEDILOL TO 12.5 MG TWICE DAILY=2 OF THE 6.25 MG TABLETS TWICE DAILY 

## 2014-11-21 NOTE — Addendum Note (Signed)
Addended by: Freddi Starr on: 11/21/2014 09:58 AM   Modules accepted: Orders

## 2014-11-21 NOTE — Assessment & Plan Note (Signed)
Continue statin. 

## 2014-11-21 NOTE — Assessment & Plan Note (Signed)
Blood pressure elevated. Increase Coreg to 12.5 mg twice a day. Follow blood pressure at home and we will increase medications as needed.

## 2014-11-21 NOTE — Assessment & Plan Note (Signed)
Continue aspirin and statin. 

## 2014-12-14 ENCOUNTER — Other Ambulatory Visit (HOSPITAL_BASED_OUTPATIENT_CLINIC_OR_DEPARTMENT_OTHER): Payer: 59

## 2014-12-14 ENCOUNTER — Ambulatory Visit (HOSPITAL_BASED_OUTPATIENT_CLINIC_OR_DEPARTMENT_OTHER): Payer: 59

## 2014-12-14 DIAGNOSIS — D631 Anemia in chronic kidney disease: Secondary | ICD-10-CM

## 2014-12-14 DIAGNOSIS — D649 Anemia, unspecified: Secondary | ICD-10-CM

## 2014-12-14 DIAGNOSIS — D638 Anemia in other chronic diseases classified elsewhere: Secondary | ICD-10-CM

## 2014-12-14 DIAGNOSIS — N189 Chronic kidney disease, unspecified: Secondary | ICD-10-CM

## 2014-12-14 LAB — CBC & DIFF AND RETIC
BASO%: 0.4 % (ref 0.0–2.0)
Basophils Absolute: 0 10*3/uL (ref 0.0–0.1)
EOS%: 3.3 % (ref 0.0–7.0)
Eosinophils Absolute: 0.2 10*3/uL (ref 0.0–0.5)
HCT: 32.4 % — ABNORMAL LOW (ref 38.4–49.9)
HGB: 9.5 g/dL — ABNORMAL LOW (ref 13.0–17.1)
Immature Retic Fract: 6.8 % (ref 3.00–10.60)
LYMPH%: 22.6 % (ref 14.0–49.0)
MCH: 22.9 pg — ABNORMAL LOW (ref 27.2–33.4)
MCHC: 29.2 g/dL — ABNORMAL LOW (ref 32.0–36.0)
MCV: 78.5 fL — ABNORMAL LOW (ref 79.3–98.0)
MONO#: 0.5 10*3/uL (ref 0.1–0.9)
MONO%: 8.3 % (ref 0.0–14.0)
NEUT#: 3.9 10*3/uL (ref 1.5–6.5)
NEUT%: 65.4 % (ref 39.0–75.0)
Platelets: 293 10*3/uL (ref 140–400)
RBC: 4.13 10*6/uL — ABNORMAL LOW (ref 4.20–5.82)
RDW: 15.5 % — ABNORMAL HIGH (ref 11.0–14.6)
RETIC %: 0.89 % (ref 0.80–1.80)
Retic Ct Abs: 36.76 10*3/uL (ref 34.80–93.90)
WBC: 6 10*3/uL (ref 4.0–10.3)
lymph#: 1.4 10*3/uL (ref 0.9–3.3)

## 2014-12-14 MED ORDER — DARBEPOETIN ALFA 150 MCG/0.3ML IJ SOSY
150.0000 ug | PREFILLED_SYRINGE | Freq: Once | INTRAMUSCULAR | Status: AC
  Administered 2014-12-14: 150 ug via SUBCUTANEOUS
  Filled 2014-12-14: qty 0.3

## 2015-01-14 ENCOUNTER — Ambulatory Visit: Payer: 59

## 2015-01-14 ENCOUNTER — Other Ambulatory Visit (HOSPITAL_BASED_OUTPATIENT_CLINIC_OR_DEPARTMENT_OTHER): Payer: 59

## 2015-01-14 DIAGNOSIS — N189 Chronic kidney disease, unspecified: Secondary | ICD-10-CM | POA: Diagnosis not present

## 2015-01-14 DIAGNOSIS — D649 Anemia, unspecified: Secondary | ICD-10-CM

## 2015-01-14 DIAGNOSIS — D631 Anemia in chronic kidney disease: Secondary | ICD-10-CM | POA: Diagnosis not present

## 2015-01-14 DIAGNOSIS — D638 Anemia in other chronic diseases classified elsewhere: Secondary | ICD-10-CM

## 2015-01-14 LAB — CBC & DIFF AND RETIC
BASO%: 0.2 % (ref 0.0–2.0)
Basophils Absolute: 0 10*3/uL (ref 0.0–0.1)
EOS ABS: 0.2 10*3/uL (ref 0.0–0.5)
EOS%: 2.6 % (ref 0.0–7.0)
HEMATOCRIT: 34.9 % — AB (ref 38.4–49.9)
HGB: 10.6 g/dL — ABNORMAL LOW (ref 13.0–17.1)
Immature Retic Fract: 5.9 % (ref 3.00–10.60)
LYMPH%: 16.8 % (ref 14.0–49.0)
MCH: 23.9 pg — ABNORMAL LOW (ref 27.2–33.4)
MCHC: 30.4 g/dL — ABNORMAL LOW (ref 32.0–36.0)
MCV: 78.8 fL — ABNORMAL LOW (ref 79.3–98.0)
MONO#: 0.5 10*3/uL (ref 0.1–0.9)
MONO%: 8.8 % (ref 0.0–14.0)
NEUT%: 71.6 % (ref 39.0–75.0)
NEUTROS ABS: 4.2 10*3/uL (ref 1.5–6.5)
PLATELETS: 307 10*3/uL (ref 140–400)
RBC: 4.43 10*6/uL (ref 4.20–5.82)
RDW: 15.2 % — ABNORMAL HIGH (ref 11.0–14.6)
RETIC %: 0.61 % — AB (ref 0.80–1.80)
Retic Ct Abs: 27.02 10*3/uL — ABNORMAL LOW (ref 34.80–93.90)
WBC: 5.8 10*3/uL (ref 4.0–10.3)
lymph#: 1 10*3/uL (ref 0.9–3.3)

## 2015-01-14 MED ORDER — DARBEPOETIN ALFA 150 MCG/0.3ML IJ SOSY: 150.0000 ug | PREFILLED_SYRINGE | Freq: Once | INTRAMUSCULAR | Status: DC

## 2015-02-13 ENCOUNTER — Ambulatory Visit: Payer: 59 | Admitting: Hematology

## 2015-02-13 ENCOUNTER — Ambulatory Visit: Payer: 59

## 2015-02-13 ENCOUNTER — Other Ambulatory Visit: Payer: 59

## 2015-03-06 ENCOUNTER — Other Ambulatory Visit: Payer: Self-pay | Admitting: Cardiology

## 2015-03-07 NOTE — Telephone Encounter (Signed)
Rx(s) sent to pharmacy electronically.  

## 2015-03-08 ENCOUNTER — Telehealth: Payer: Self-pay | Admitting: Hematology

## 2015-03-08 ENCOUNTER — Other Ambulatory Visit: Payer: Self-pay | Admitting: *Deleted

## 2015-03-08 ENCOUNTER — Telehealth: Payer: Self-pay | Admitting: *Deleted

## 2015-03-08 NOTE — Telephone Encounter (Signed)
VM message received @ 10:36 am from pt's wife, requesting call back. Call made to wife. She states they missed his last appt with Dr. Mosetta Putt on 01/14/15 and would like to re-schedule. He may need aranesp injection also, but she will wait to see what DrMarland Kitchen Mosetta Putt recommends.

## 2015-03-08 NOTE — Telephone Encounter (Signed)
Lft msg for pt confirming labs/ov/inj per 06/10 POF pt missed last apt, mailed out schedule... KJ

## 2015-03-08 NOTE — Telephone Encounter (Signed)
Please reschedule, with APP or me in a few weeks, appointment with aranesp injection also.  Malachy Mood

## 2015-03-08 NOTE — Telephone Encounter (Signed)
POF to scheduler 

## 2015-03-13 ENCOUNTER — Telehealth: Payer: Self-pay | Admitting: Hematology

## 2015-03-13 NOTE — Telephone Encounter (Signed)
pt wife cld to r/s appt-gave updated time & date

## 2015-03-25 ENCOUNTER — Ambulatory Visit (HOSPITAL_BASED_OUTPATIENT_CLINIC_OR_DEPARTMENT_OTHER): Payer: 59

## 2015-03-25 ENCOUNTER — Ambulatory Visit: Payer: 59

## 2015-03-25 ENCOUNTER — Ambulatory Visit (HOSPITAL_BASED_OUTPATIENT_CLINIC_OR_DEPARTMENT_OTHER): Payer: 59 | Admitting: Hematology

## 2015-03-25 ENCOUNTER — Ambulatory Visit: Payer: 59 | Admitting: Hematology

## 2015-03-25 ENCOUNTER — Encounter: Payer: Self-pay | Admitting: Hematology

## 2015-03-25 ENCOUNTER — Other Ambulatory Visit: Payer: 59

## 2015-03-25 ENCOUNTER — Other Ambulatory Visit (HOSPITAL_BASED_OUTPATIENT_CLINIC_OR_DEPARTMENT_OTHER): Payer: 59

## 2015-03-25 ENCOUNTER — Telehealth: Payer: Self-pay | Admitting: Hematology

## 2015-03-25 VITALS — BP 160/81 | HR 89 | Temp 98.5°F | Resp 16 | Ht 71.5 in | Wt 164.4 lb

## 2015-03-25 DIAGNOSIS — E119 Type 2 diabetes mellitus without complications: Secondary | ICD-10-CM

## 2015-03-25 DIAGNOSIS — N189 Chronic kidney disease, unspecified: Secondary | ICD-10-CM

## 2015-03-25 DIAGNOSIS — I10 Essential (primary) hypertension: Secondary | ICD-10-CM

## 2015-03-25 DIAGNOSIS — D6489 Other specified anemias: Secondary | ICD-10-CM | POA: Diagnosis not present

## 2015-03-25 DIAGNOSIS — D631 Anemia in chronic kidney disease: Secondary | ICD-10-CM

## 2015-03-25 DIAGNOSIS — D649 Anemia, unspecified: Secondary | ICD-10-CM

## 2015-03-25 DIAGNOSIS — D638 Anemia in other chronic diseases classified elsewhere: Secondary | ICD-10-CM

## 2015-03-25 LAB — CBC & DIFF AND RETIC
BASO%: 0.2 % (ref 0.0–2.0)
Basophils Absolute: 0 10*3/uL (ref 0.0–0.1)
EOS%: 3.5 % (ref 0.0–7.0)
Eosinophils Absolute: 0.2 10*3/uL (ref 0.0–0.5)
HCT: 23.9 % — ABNORMAL LOW (ref 38.4–49.9)
HGB: 7.3 g/dL — ABNORMAL LOW (ref 13.0–17.1)
Immature Retic Fract: 2.6 % — ABNORMAL LOW (ref 3.00–10.60)
LYMPH%: 22.9 % (ref 14.0–49.0)
MCH: 24.1 pg — AB (ref 27.2–33.4)
MCHC: 30.5 g/dL — AB (ref 32.0–36.0)
MCV: 78.9 fL — AB (ref 79.3–98.0)
MONO#: 0.6 10*3/uL (ref 0.1–0.9)
MONO%: 10.9 % (ref 0.0–14.0)
NEUT#: 3.4 10*3/uL (ref 1.5–6.5)
NEUT%: 62.5 % (ref 39.0–75.0)
Platelets: 226 10*3/uL (ref 140–400)
RBC: 3.03 10*6/uL — ABNORMAL LOW (ref 4.20–5.82)
RDW: 14.3 % (ref 11.0–14.6)
Retic %: 1.73 % (ref 0.80–1.80)
Retic Ct Abs: 52.42 10*3/uL (ref 34.80–93.90)
WBC: 5.4 10*3/uL (ref 4.0–10.3)
lymph#: 1.2 10*3/uL (ref 0.9–3.3)

## 2015-03-25 MED ORDER — INTEGRA PLUS PO CAPS: 1.0000 | ORAL_CAPSULE | Freq: Every day | ORAL | Status: DC

## 2015-03-25 MED ORDER — DARBEPOETIN ALFA 150 MCG/0.3ML IJ SOSY
150.0000 ug | PREFILLED_SYRINGE | Freq: Once | INTRAMUSCULAR | Status: AC
  Administered 2015-03-25: 150 ug via SUBCUTANEOUS
  Filled 2015-03-25: qty 0.3

## 2015-03-25 NOTE — Patient Instructions (Signed)
Darbepoetin Alfa injection What is this medicine? DARBEPOETIN ALFA (dar be POE e tin AL fa) helps your body make more red blood cells. It is used to treat anemia caused by chronic kidney failure and chemotherapy. This medicine may be used for other purposes; ask your health care provider or pharmacist if you have questions. COMMON BRAND NAME(S): Aranesp What should I tell my health care provider before I take this medicine? They need to know if you have any of these conditions: -blood clotting disorders or history of blood clots -cancer patient not on chemotherapy -cystic fibrosis -heart disease, such as angina, heart failure, or a history of a heart attack -hemoglobin level of 12 g/dL or greater -high blood pressure -low levels of folate, iron, or vitamin B12 -seizures -an unusual or allergic reaction to darbepoetin, erythropoietin, albumin, hamster proteins, latex, other medicines, foods, dyes, or preservatives -pregnant or trying to get pregnant -breast-feeding How should I use this medicine? This medicine is for injection into a vein or under the skin. It is usually given by a health care professional in a hospital or clinic setting. If you get this medicine at home, you will be taught how to prepare and give this medicine. Do not shake the solution before you withdraw a dose. Use exactly as directed. Take your medicine at regular intervals. Do not take your medicine more often than directed. It is important that you put your used needles and syringes in a special sharps container. Do not put them in a trash can. If you do not have a sharps container, call your pharmacist or healthcare provider to get one. Talk to your pediatrician regarding the use of this medicine in children. While this medicine may be used in children as young as 1 year for selected conditions, precautions do apply. Overdosage: If you think you have taken too much of this medicine contact a poison control center or  emergency room at once. NOTE: This medicine is only for you. Do not share this medicine with others. What if I miss a dose? If you miss a dose, take it as soon as you can. If it is almost time for your next dose, take only that dose. Do not take double or extra doses. What may interact with this medicine? Do not take this medicine with any of the following medications: -epoetin alfa This list may not describe all possible interactions. Give your health care provider a list of all the medicines, herbs, non-prescription drugs, or dietary supplements you use. Also tell them if you smoke, drink alcohol, or use illegal drugs. Some items may interact with your medicine. What should I watch for while using this medicine? Visit your prescriber or health care professional for regular checks on your progress and for the needed blood tests and blood pressure measurements. It is especially important for the doctor to make sure your hemoglobin level is in the desired range, to limit the risk of potential side effects and to give you the best benefit. Keep all appointments for any recommended tests. Check your blood pressure as directed. Ask your doctor what your blood pressure should be and when you should contact him or her. As your body makes more red blood cells, you may need to take iron, folic acid, or vitamin B supplements. Ask your doctor or health care provider which products are right for you. If you have kidney disease continue dietary restrictions, even though this medication can make you feel better. Talk with your doctor or health   care professional about the foods you eat and the vitamins that you take. What side effects may I notice from receiving this medicine? Side effects that you should report to your doctor or health care professional as soon as possible: -allergic reactions like skin rash, itching or hives, swelling of the face, lips, or tongue -breathing problems -changes in vision -chest  pain -confusion, trouble speaking or understanding -feeling faint or lightheaded, falls -high blood pressure -muscle aches or pains -pain, swelling, warmth in the leg -rapid weight gain -severe headaches -sudden numbness or weakness of the face, arm or leg -trouble walking, dizziness, loss of balance or coordination -seizures (convulsions) -swelling of the ankles, feet, hands -unusually weak or tired Side effects that usually do not require medical attention (report to your doctor or health care professional if they continue or are bothersome): -diarrhea -fever, chills (flu-like symptoms) -headaches -nausea, vomiting -redness, stinging, or swelling at site where injected This list may not describe all possible side effects. Call your doctor for medical advice about side effects. You may report side effects to FDA at 1-800-FDA-1088. Where should I keep my medicine? Keep out of the reach of children. Store in a refrigerator between 2 and 8 degrees C (36 and 46 degrees F). Do not freeze. Do not shake. Throw away any unused portion if using a single-dose vial. Throw away any unused medicine after the expiration date. NOTE: This sheet is a summary. It may not cover all possible information. If you have questions about this medicine, talk to your doctor, pharmacist, or health care provider.  2015, Elsevier/Gold Standard. (2008-08-28 10:23:57)  

## 2015-03-25 NOTE — Telephone Encounter (Signed)
Pt confirmed labs/ov/inj per 06/27 POF, gave pt AVS and Calendar... KJ °
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
## 2015-04-10 DIAGNOSIS — E11319 Type 2 diabetes mellitus with unspecified diabetic retinopathy without macular edema: Secondary | ICD-10-CM | POA: Diagnosis not present

## 2015-04-10 DIAGNOSIS — I10 Essential (primary) hypertension: Secondary | ICD-10-CM | POA: Diagnosis not present

## 2015-04-10 DIAGNOSIS — D649 Anemia, unspecified: Secondary | ICD-10-CM | POA: Diagnosis not present

## 2015-04-10 DIAGNOSIS — E1140 Type 2 diabetes mellitus with diabetic neuropathy, unspecified: Secondary | ICD-10-CM | POA: Diagnosis not present

## 2015-04-10 DIAGNOSIS — E118 Type 2 diabetes mellitus with unspecified complications: Secondary | ICD-10-CM | POA: Diagnosis not present

## 2015-04-10 DIAGNOSIS — E785 Hyperlipidemia, unspecified: Secondary | ICD-10-CM | POA: Diagnosis not present

## 2015-04-22 DIAGNOSIS — E11359 Type 2 diabetes mellitus with proliferative diabetic retinopathy without macular edema: Secondary | ICD-10-CM | POA: Diagnosis not present

## 2015-04-29 ENCOUNTER — Other Ambulatory Visit: Payer: Self-pay | Admitting: Internal Medicine

## 2015-05-01 DIAGNOSIS — I10 Essential (primary) hypertension: Secondary | ICD-10-CM | POA: Diagnosis not present

## 2015-05-16 ENCOUNTER — Ambulatory Visit: Payer: 59 | Admitting: Internal Medicine

## 2015-05-20 ENCOUNTER — Encounter: Payer: Self-pay | Admitting: Cardiovascular Disease

## 2015-05-21 DIAGNOSIS — E118 Type 2 diabetes mellitus with unspecified complications: Secondary | ICD-10-CM | POA: Diagnosis not present

## 2015-05-21 DIAGNOSIS — Z794 Long term (current) use of insulin: Secondary | ICD-10-CM | POA: Diagnosis not present

## 2015-05-24 ENCOUNTER — Other Ambulatory Visit (HOSPITAL_BASED_OUTPATIENT_CLINIC_OR_DEPARTMENT_OTHER): Payer: 59

## 2015-05-24 ENCOUNTER — Ambulatory Visit (HOSPITAL_BASED_OUTPATIENT_CLINIC_OR_DEPARTMENT_OTHER): Payer: 59

## 2015-05-24 VITALS — BP 139/85 | Temp 98.5°F

## 2015-05-24 DIAGNOSIS — D631 Anemia in chronic kidney disease: Secondary | ICD-10-CM

## 2015-05-24 DIAGNOSIS — N189 Chronic kidney disease, unspecified: Secondary | ICD-10-CM

## 2015-05-24 DIAGNOSIS — D638 Anemia in other chronic diseases classified elsewhere: Secondary | ICD-10-CM

## 2015-05-24 DIAGNOSIS — D649 Anemia, unspecified: Secondary | ICD-10-CM

## 2015-05-24 LAB — CBC & DIFF AND RETIC
BASO%: 0.2 % (ref 0.0–2.0)
Basophils Absolute: 0 10*3/uL (ref 0.0–0.1)
EOS ABS: 0.2 10*3/uL (ref 0.0–0.5)
EOS%: 3.8 % (ref 0.0–7.0)
HEMATOCRIT: 28.3 % — AB (ref 38.4–49.9)
HEMOGLOBIN: 8.6 g/dL — AB (ref 13.0–17.1)
IMMATURE RETIC FRACT: 2.7 % — AB (ref 3.00–10.60)
LYMPH%: 19.1 % (ref 14.0–49.0)
MCH: 23.8 pg — AB (ref 27.2–33.4)
MCHC: 30.4 g/dL — ABNORMAL LOW (ref 32.0–36.0)
MCV: 78.2 fL — AB (ref 79.3–98.0)
MONO#: 0.6 10*3/uL (ref 0.1–0.9)
MONO%: 10.6 % (ref 0.0–14.0)
NEUT#: 4 10*3/uL (ref 1.5–6.5)
NEUT%: 66.3 % (ref 39.0–75.0)
PLATELETS: 224 10*3/uL (ref 140–400)
RBC: 3.62 10*6/uL — ABNORMAL LOW (ref 4.20–5.82)
RDW: 14.2 % (ref 11.0–14.6)
Retic %: 1.18 % (ref 0.80–1.80)
Retic Ct Abs: 42.72 10*3/uL (ref 34.80–93.90)
WBC: 6 10*3/uL (ref 4.0–10.3)
lymph#: 1.2 10*3/uL (ref 0.9–3.3)

## 2015-05-24 MED ORDER — DARBEPOETIN ALFA 150 MCG/0.3ML IJ SOSY
150.0000 ug | PREFILLED_SYRINGE | Freq: Once | INTRAMUSCULAR | Status: AC
  Administered 2015-05-24: 150 ug via SUBCUTANEOUS
  Filled 2015-05-24: qty 0.3

## 2015-05-28 ENCOUNTER — Ambulatory Visit (INDEPENDENT_AMBULATORY_CARE_PROVIDER_SITE_OTHER): Payer: 59 | Admitting: Cardiovascular Disease

## 2015-05-28 ENCOUNTER — Encounter: Payer: Self-pay | Admitting: Cardiovascular Disease

## 2015-05-28 VITALS — BP 120/62 | HR 79 | Ht 71.0 in | Wt 162.0 lb

## 2015-05-28 DIAGNOSIS — I10 Essential (primary) hypertension: Secondary | ICD-10-CM

## 2015-05-28 DIAGNOSIS — I739 Peripheral vascular disease, unspecified: Secondary | ICD-10-CM

## 2015-05-28 NOTE — Patient Instructions (Signed)
  We will see you back in follow up in 1 year with Dr Berry.   Dr Berry has ordered: 1. Lower extremity arterial doppler- During this test, ultrasound is used to evaluate arterial blood flow in the legs. Allow approximately one hour for this exam.      

## 2015-05-28 NOTE — Progress Notes (Signed)
05/28/2015 Keith Guzman   [birth year]  [id number]  Primary Physician Keith Josephs, MD Primary Cardiologist: Keith Gess MD Keith Guzman   HPI:  The patient is a 54 year old, thin-appearing, married [place] American male, father of 2, grandfather to 1 grandchild who is currently out of work and was referred by Dr. Jimmey Guzman originally for critical limb ischemia. He is accompanied by his wife today. I last saw him in the office 03/07/14.He has had a heel ulcer probably related to trauma that was managed by Dr. Jimmey Guzman. Venous Dopplers were negative and arterial Dopplers showed a right ABI of 1 and a left of 0.59 with high-grade popliteal and tibial stenosis. I angiogram'd him November 29, 2012, revealing an 80% distal left SFA stenosis in the adductor canal, 90% below-the-knee popiteal stenosis extending the tibioperoneal trunk with an occluded anterior tibial. I did directional atherectomy of his distal SFA and AngioSculpt cutting balloon angioplasty of the popliteal and tibioperoneal trunk with an excellent result. His ABI did increase from 0.5 range to 0.75. He ultimately developed an ulcer on his left great toe and underwent transmetatarsal amputation by Dr. Aldean Guzman which he and Dr. Jimmey Guzman are followed.   Dr. Jens Guzman follows temporal artery vessel point of view. He did have a Myoview stress test performed last year that was nonischemic with an ejection fraction of 47%. He does complain of some lifestyle limiting claudication. His most recent Dopplers performed 07/05/13 revealed ABIs in the 0.9 range with high-frequency signal in the mid right SFA and left popliteal artery.     Current Outpatient Prescriptions  Medication Sig Dispense Refill  . acetaminophen (TYLENOL) 500 MG tablet Per bottle as needed for pain    . aspirin EC 81 MG tablet Take 1 tablet (81 mg total) by mouth daily. 90 tablet 3  . atorvastatin (LIPITOR) 80 MG tablet TAKE 1 TABLET (80 MG TOTAL) BY MOUTH DAILY. 90  tablet 3  . carvedilol (COREG) 12.5 MG tablet Take 1 tablet (12.5 mg total) by mouth 2 (two) times daily. 180 tablet 3  . Darbepoetin Alfa 300 MCG/ML SOLN Inject 300 mcg as directed every 30 (thirty) days.    . FeFum-FePoly-FA-B Cmp-C-Biot (INTEGRA PLUS) CAPS Take 1 capsule by mouth daily. 30 capsule 3  . glipiZIDE (GLUCOTROL XL) 10 MG 24 hr tablet Take 10 mg by mouth daily with breakfast.    . LANTUS SOLOSTAR 100 UNIT/ML Solostar Pen 18 Units at bedtime. Increasing dose to get a blood sugar of 120  1  . Multiple Vitamin (MULTIVITAMIN WITH MINERALS) TABS Take 1 tablet by mouth every other day.      No current facility-administered medications for this visit.    No Known Allergies  Social History   Social History  . Marital Status: Married    Spouse Name: N/A  . Number of Children: 2  . Years of Education: N/A   Occupational History  .     Social History Main Topics  . Smoking status: Former Smoker -- 1.00 packs/day for 35 years    Types: Cigarettes    Quit date: 12/01/2003  . Smokeless tobacco: Never Used  . Alcohol Use: No  . Drug Use: No  . Sexual Activity: Yes   Other Topics Concern  . Not on file   Social History Narrative     Review of Systems: General: negative for chills, fever, night sweats or weight changes.  Cardiovascular: negative for chest pain, dyspnea on exertion, edema, orthopnea, palpitations, paroxysmal  nocturnal dyspnea or shortness of breath Dermatological: negative for rash Respiratory: negative for cough or wheezing Urologic: negative for hematuria Abdominal: negative for nausea, vomiting, diarrhea, bright red blood per rectum, melena, or hematemesis Neurologic: negative for visual changes, syncope, or dizziness All other systems reviewed and are otherwise negative except as noted above.    Blood pressure 120/62, pulse 79, height  (1.803 m), weight 162 lb (73.483 kg).  General appearance: alert and no distress Neck: no adenopathy, no  JVD, supple, symmetrical, trachea midline, thyroid not enlarged, symmetric, no tenderness/mass/nodules and ssoft right carotid bruit Lungs: clear to auscultation bilaterally Heart: regular rate and rhythm, S1, S2 normal, no murmur, click, rub or gallop Extremities: extremities normal, atraumatic, no cyanosis or edema Pulses: 2+ and symmetric 1+ pedal pulses bilaterally  EKG normal sinus rhythm at 79 with lateral T-wave inversion. I personally reviewed his EKG  ASSESSMENT AND PLAN:   PVD, LSFA PTA 11/29/12 History of peripheral arterial disease with intervention on his left SFA, popliteal and tibioperoneal trunk by myself 11/29/12. He was initially sent to me by Dr. Jimmey Guzman for critical limb ischemia. He openly underwent a left transmetatarsal amputation by KeithDuda . His most recent Dopplers performed 07/03/13 revealed a right ABI 0.99 with a high-frequency signal in his mid right SFA and left ABI 0.94 with a high-frequency signal in his left popliteal artery. He does get some lifestyle of claudication this has not changed since I saw him. He has had no recurrent ulcers.      Keith Gess MD FACP,FACC,FAHA, [facility] 05/28/2015 10:48 AM

## 2015-05-28 NOTE — Assessment & Plan Note (Signed)
History of peripheral arterial disease with intervention on his left SFA, popliteal and tibioperoneal trunk by myself 11/29/12. He was initially sent to me by Dr. Jimmey Ralph for critical limb ischemia. He openly underwent a left transmetatarsal amputation by Dr.Duda . His most recent Dopplers performed 07/03/13 revealed a right ABI 0.99 with a high-frequency signal in his mid right SFA and left ABI 0.94 with a high-frequency signal in his left popliteal artery. He does get some lifestyle of claudication this has not changed since I saw him. He has had no recurrent ulcers.

## 2015-06-06 ENCOUNTER — Other Ambulatory Visit: Payer: Self-pay | Admitting: Cardiovascular Disease

## 2015-06-06 DIAGNOSIS — I779 Disorder of arteries and arterioles, unspecified: Secondary | ICD-10-CM

## 2015-06-13 ENCOUNTER — Encounter (HOSPITAL_COMMUNITY): Payer: Medicare Other

## 2015-06-20 ENCOUNTER — Ambulatory Visit (HOSPITAL_COMMUNITY)
Admission: RE | Admit: 2015-06-20 | Discharge: 2015-06-20 | Disposition: A | Discharge status: Home / Self Care | Payer: 59 | Source: Ambulatory Visit | Attending: Cardiology | Admitting: Cardiology

## 2015-06-20 DIAGNOSIS — I779 Disorder of arteries and arterioles, unspecified: Secondary | ICD-10-CM

## 2015-06-20 DIAGNOSIS — E785 Hyperlipidemia, unspecified: Secondary | ICD-10-CM | POA: Insufficient documentation

## 2015-06-20 DIAGNOSIS — Z89432 Acquired absence of left foot: Secondary | ICD-10-CM | POA: Diagnosis not present

## 2015-06-20 DIAGNOSIS — Z9862 Peripheral vascular angioplasty status: Secondary | ICD-10-CM | POA: Insufficient documentation

## 2015-06-20 DIAGNOSIS — I739 Peripheral vascular disease, unspecified: Secondary | ICD-10-CM | POA: Diagnosis not present

## 2015-06-20 DIAGNOSIS — I10 Essential (primary) hypertension: Secondary | ICD-10-CM | POA: Diagnosis not present

## 2015-06-20 DIAGNOSIS — I70202 Unspecified atherosclerosis of native arteries of extremities, left leg: Secondary | ICD-10-CM | POA: Diagnosis not present

## 2015-06-20 DIAGNOSIS — E119 Type 2 diabetes mellitus without complications: Secondary | ICD-10-CM | POA: Diagnosis not present

## 2015-07-24 ENCOUNTER — Ambulatory Visit (HOSPITAL_BASED_OUTPATIENT_CLINIC_OR_DEPARTMENT_OTHER): Payer: 59

## 2015-07-24 ENCOUNTER — Other Ambulatory Visit: Payer: Self-pay | Admitting: Hematology

## 2015-07-24 ENCOUNTER — Other Ambulatory Visit (HOSPITAL_BASED_OUTPATIENT_CLINIC_OR_DEPARTMENT_OTHER): Payer: 59

## 2015-07-24 ENCOUNTER — Other Ambulatory Visit: Payer: Self-pay | Admitting: *Deleted

## 2015-07-24 VITALS — BP 150/84 | HR 79 | Temp 98.3°F

## 2015-07-24 DIAGNOSIS — D631 Anemia in chronic kidney disease: Secondary | ICD-10-CM

## 2015-07-24 DIAGNOSIS — N189 Chronic kidney disease, unspecified: Secondary | ICD-10-CM | POA: Diagnosis not present

## 2015-07-24 DIAGNOSIS — D649 Anemia, unspecified: Secondary | ICD-10-CM | POA: Diagnosis not present

## 2015-07-24 DIAGNOSIS — D638 Anemia in other chronic diseases classified elsewhere: Secondary | ICD-10-CM

## 2015-07-24 LAB — CBC & DIFF AND RETIC
BASO%: 0.1 % (ref 0.0–2.0)
BASOS ABS: 0 10*3/uL (ref 0.0–0.1)
EOS ABS: 0.2 10*3/uL (ref 0.0–0.5)
EOS%: 3.3 % (ref 0.0–7.0)
HEMATOCRIT: 30 % — AB (ref 38.4–49.9)
HEMOGLOBIN: 9.2 g/dL — AB (ref 13.0–17.1)
Immature Retic Fract: 5.2 % (ref 3.00–10.60)
LYMPH%: 19.9 % (ref 14.0–49.0)
MCH: 24.1 pg — AB (ref 27.2–33.4)
MCHC: 30.7 g/dL — ABNORMAL LOW (ref 32.0–36.0)
MCV: 78.7 fL — AB (ref 79.3–98.0)
MONO#: 0.6 10*3/uL (ref 0.1–0.9)
MONO%: 7.8 % (ref 0.0–14.0)
NEUT#: 5 10*3/uL (ref 1.5–6.5)
NEUT%: 68.9 % (ref 39.0–75.0)
PLATELETS: 230 10*3/uL (ref 140–400)
RBC: 3.81 10*6/uL — ABNORMAL LOW (ref 4.20–5.82)
RDW: 14.9 % — ABNORMAL HIGH (ref 11.0–14.6)
Retic %: 1.15 % (ref 0.80–1.80)
Retic Ct Abs: 43.82 10*3/uL (ref 34.80–93.90)
WBC: 7.3 10*3/uL (ref 4.0–10.3)
lymph#: 1.5 10*3/uL (ref 0.9–3.3)

## 2015-07-24 MED ORDER — DARBEPOETIN ALFA 150 MCG/0.3ML IJ SOSY
150.0000 ug | PREFILLED_SYRINGE | Freq: Once | INTRAMUSCULAR | Status: AC
  Administered 2015-07-24: 150 ug via SUBCUTANEOUS
  Filled 2015-07-24: qty 0.3

## 2015-07-30 ENCOUNTER — Ambulatory Visit (HOSPITAL_BASED_OUTPATIENT_CLINIC_OR_DEPARTMENT_OTHER): Payer: 59 | Admitting: Hematology

## 2015-07-30 ENCOUNTER — Encounter: Payer: Self-pay | Admitting: Hematology

## 2015-07-30 ENCOUNTER — Telehealth: Payer: Self-pay | Admitting: Hematology

## 2015-07-30 VITALS — BP 144/75 | HR 84 | Temp 98.0°F | Resp 19 | Ht 71.5 in | Wt 166.7 lb

## 2015-07-30 DIAGNOSIS — I779 Disorder of arteries and arterioles, unspecified: Secondary | ICD-10-CM | POA: Diagnosis not present

## 2015-07-30 DIAGNOSIS — N189 Chronic kidney disease, unspecified: Secondary | ICD-10-CM

## 2015-07-30 DIAGNOSIS — E119 Type 2 diabetes mellitus without complications: Secondary | ICD-10-CM

## 2015-07-30 DIAGNOSIS — D6489 Other specified anemias: Secondary | ICD-10-CM

## 2015-07-30 DIAGNOSIS — D638 Anemia in other chronic diseases classified elsewhere: Secondary | ICD-10-CM

## 2015-07-30 DIAGNOSIS — I10 Essential (primary) hypertension: Secondary | ICD-10-CM | POA: Diagnosis not present
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
## 2015-07-30 NOTE — Telephone Encounter (Signed)
Gave adn printed appt sched for pt for NOV thru March

## 2015-08-04 ENCOUNTER — Other Ambulatory Visit: Payer: Self-pay | Admitting: Hematology

## 2015-08-27 ENCOUNTER — Ambulatory Visit (HOSPITAL_BASED_OUTPATIENT_CLINIC_OR_DEPARTMENT_OTHER): Payer: 59

## 2015-08-27 ENCOUNTER — Other Ambulatory Visit (HOSPITAL_BASED_OUTPATIENT_CLINIC_OR_DEPARTMENT_OTHER): Payer: 59

## 2015-08-27 VITALS — BP 141/73 | HR 81 | Temp 98.3°F

## 2015-08-27 DIAGNOSIS — D638 Anemia in other chronic diseases classified elsewhere: Secondary | ICD-10-CM

## 2015-08-27 DIAGNOSIS — N189 Chronic kidney disease, unspecified: Secondary | ICD-10-CM | POA: Diagnosis not present

## 2015-08-27 DIAGNOSIS — D649 Anemia, unspecified: Secondary | ICD-10-CM

## 2015-08-27 LAB — IRON AND TIBC CHCC
%SAT: 37 % (ref 20–55)
Iron: 84 ug/dL (ref 42–163)
TIBC: 225 ug/dL (ref 202–409)
UIBC: 141 ug/dL (ref 117–376)

## 2015-08-27 LAB — CBC & DIFF AND RETIC
BASO%: 0.1 % (ref 0.0–2.0)
BASOS ABS: 0 10*3/uL (ref 0.0–0.1)
EOS ABS: 0.3 10*3/uL (ref 0.0–0.5)
EOS%: 3.3 % (ref 0.0–7.0)
HCT: 32.3 % — ABNORMAL LOW (ref 38.4–49.9)
HGB: 9.9 g/dL — ABNORMAL LOW (ref 13.0–17.1)
Immature Retic Fract: 2 % — ABNORMAL LOW (ref 3.00–10.60)
LYMPH%: 17.3 % (ref 14.0–49.0)
MCH: 23.9 pg — AB (ref 27.2–33.4)
MCHC: 30.7 g/dL — AB (ref 32.0–36.0)
MCV: 77.8 fL — ABNORMAL LOW (ref 79.3–98.0)
MONO#: 0.7 10*3/uL (ref 0.1–0.9)
MONO%: 8.4 % (ref 0.0–14.0)
NEUT#: 5.6 10*3/uL (ref 1.5–6.5)
NEUT%: 70.9 % (ref 39.0–75.0)
Platelets: 246 10*3/uL (ref 140–400)
RBC: 4.15 10*6/uL — AB (ref 4.20–5.82)
RDW: 15.3 % — ABNORMAL HIGH (ref 11.0–14.6)
Retic %: 0.81 % (ref 0.80–1.80)
Retic Ct Abs: 33.62 10*3/uL — ABNORMAL LOW (ref 34.80–93.90)
WBC: 7.9 10*3/uL (ref 4.0–10.3)
lymph#: 1.4 10*3/uL (ref 0.9–3.3)

## 2015-08-27 LAB — COMPREHENSIVE METABOLIC PANEL (CC13)
ALT: 47 U/L (ref 0–55)
AST: 30 U/L (ref 5–34)
Albumin: 3.2 g/dL — ABNORMAL LOW (ref 3.5–5.0)
Alkaline Phosphatase: 109 U/L (ref 40–150)
Anion Gap: 7 mEq/L (ref 3–11)
BUN: 18.9 mg/dL (ref 7.0–26.0)
CHLORIDE: 105 meq/L (ref 98–109)
CO2: 26 meq/L (ref 22–29)
CREATININE: 1.7 mg/dL — AB (ref 0.7–1.3)
Calcium: 9.1 mg/dL (ref 8.4–10.4)
EGFR: 52 mL/min/{1.73_m2} — ABNORMAL LOW (ref >90)
GLUCOSE: 233 mg/dL — AB (ref 70–140)
POTASSIUM: 4.6 meq/L (ref 3.5–5.1)
SODIUM: 137 meq/L (ref 136–145)
Total Bilirubin: 0.47 mg/dL (ref 0.20–1.20)
Total Protein: 7 g/dL (ref 6.4–8.3)

## 2015-08-27 LAB — FERRITIN CHCC: FERRITIN: 457 ng/mL — AB (ref 22–316)

## 2015-08-27 MED ORDER — DARBEPOETIN ALFA 150 MCG/0.3ML IJ SOSY
150.0000 ug | PREFILLED_SYRINGE | Freq: Once | INTRAMUSCULAR | Status: AC
  Administered 2015-08-27: 150 ug via SUBCUTANEOUS
  Filled 2015-08-27: qty 0.3

## 2015-09-12 ENCOUNTER — Ambulatory Visit
Admission: RE | Admit: 2015-09-12 | Discharge: 2015-09-12 | Disposition: A | Discharge status: Home / Self Care | Payer: 59 | Source: Ambulatory Visit | Attending: Family Medicine | Admitting: Family Medicine

## 2015-09-12 ENCOUNTER — Other Ambulatory Visit: Payer: Self-pay | Admitting: Family Medicine

## 2015-09-12 DIAGNOSIS — T148XXA Other injury of unspecified body region, initial encounter: Principal | ICD-10-CM

## 2015-09-12 DIAGNOSIS — L089 Local infection of the skin and subcutaneous tissue, unspecified: Secondary | ICD-10-CM

## 2015-09-13 ENCOUNTER — Encounter: Payer: 59 | Attending: Surgery | Admitting: Surgery

## 2015-09-13 DIAGNOSIS — T25321A Burn of third degree of right foot, initial encounter: Secondary | ICD-10-CM | POA: Diagnosis not present

## 2015-09-13 DIAGNOSIS — E1140 Type 2 diabetes mellitus with diabetic neuropathy, unspecified: Secondary | ICD-10-CM | POA: Insufficient documentation

## 2015-09-13 DIAGNOSIS — E11621 Type 2 diabetes mellitus with foot ulcer: Secondary | ICD-10-CM | POA: Diagnosis not present

## 2015-09-13 DIAGNOSIS — I420 Dilated cardiomyopathy: Secondary | ICD-10-CM | POA: Insufficient documentation

## 2015-09-13 DIAGNOSIS — X58XXXA Exposure to other specified factors, initial encounter: Secondary | ICD-10-CM | POA: Insufficient documentation

## 2015-09-13 DIAGNOSIS — I10 Essential (primary) hypertension: Secondary | ICD-10-CM | POA: Diagnosis not present

## 2015-09-13 DIAGNOSIS — L97512 Non-pressure chronic ulcer of other part of right foot with fat layer exposed: Secondary | ICD-10-CM | POA: Insufficient documentation

## 2015-09-13 DIAGNOSIS — Z87891 Personal history of nicotine dependence: Secondary | ICD-10-CM | POA: Insufficient documentation

## 2015-09-13 DIAGNOSIS — E7800 Pure hypercholesterolemia, unspecified: Secondary | ICD-10-CM | POA: Insufficient documentation
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
## 2015-09-14 NOTE — Progress Notes (Signed)
Keith Guzman ([id number]) Visit Report for 09/13/2015 Abuse/Suicide Risk Screen Details Patient Name: Keith Guzman, Keith Guzman. Date of Service: 09/13/2015 1:30 PM Medical Record Number: [id number] Patient Account Number: [id number] Date of Birth/Sex: January 08, 1961 (54 y.o. Male) Treating RN: Clover Mealy, RN, BSN, [place] Sink Primary Care Physician: Farris Has Other Clinician: Referring Physician: Farris Has Treating Physician/Extender: Rudene Re in Treatment: 0 Abuse/Suicide Risk Screen Items Answer ABUSE/SUICIDE RISK SCREEN: Has anyone close to you tried to hurt or harm you recentlyo No Do you feel uncomfortable with anyone in your familyo No Has anyone forced you do things that you didnot want to doo No Do you have any thoughts of harming yourselfo No Patient displays signs or symptoms of abuse and/or neglect. No Electronic Signature(s) Signed: 09/13/2015 3:55:28 PM By: Elpidio Eric BSN, RN Entered By: Elpidio Eric on 09/13/2015 13:48:00 Keith Guzman, Keith Guzman ([id number]) -------------------------------------------------------------------------------- Activities of Daily Living Details Patient Name: Keith Guzman. Date of Service: 09/13/2015 1:30 PM Medical Record Number: [id number] Patient Account Number: [id number] Date of Birth/Sex: 09/06/1961 (54 y.o. Male) Treating RN: Clover Mealy, RN, BSN, [place] Sink Primary Care Physician: Farris Has Other Clinician: Referring Physician: Farris Has Treating Physician/Extender: Rudene Re in Treatment: 0 Activities of Daily Living Items Answer Activities of Daily Living (Please select one for each item) Drive Automobile Not Able Take Medications Need Assistance Use Telephone Need Assistance Care for Appearance Need Assistance Use Toilet Need Assistance Bath / Shower Need Assistance Dress Self Need Assistance Feed Self Need Assistance Walk Need Assistance Get In / Out Bed Need Assistance Housework Need Assistance Prepare Meals Need  Assistance Handle Money Need Assistance Shop for Self Need Assistance Electronic Signature(s) Signed: 09/13/2015 3:55:28 PM By: Elpidio Eric BSN, RN Entered By: Elpidio Eric on 09/13/2015 13:47:49 Keith Guzman ([id number]) -------------------------------------------------------------------------------- Education Assessment Details Patient Name: Keith Guzman. Date of Service: 09/13/2015 1:30 PM Medical Record Number: [id number] Patient Account Number: [id number] Date of Birth/Sex: [birth year] (54 y.o. Male) Treating RN: Clover Mealy, RN, BSN,  [place] Primary Care Physician: Farris Has Other Clinician: Referring Physician: Farris Has Treating Physician/Extender: Rudene Re in Treatment: 0 Primary Learner Assessed: Patient Learning Preferences/Education Level/Primary Language Learning Preference: Explanation Highest Education Level: College or Above Preferred Language: English Cognitive Barrier Assessment/Beliefs Language Barrier: No Physical Barrier Assessment Impaired Vision: Yes Glasses Impaired Hearing: No Decreased Hand dexterity: No Knowledge/Comprehension Assessment Knowledge Level: Medium Comprehension Level: Medium Ability to understand written Medium instructions: Ability to understand verbal Medium instructions: Motivation Assessment Anxiety Level: Calm Cooperation: Cooperative Education Importance: Acknowledges Need Interest in Health Problems: Asks Questions Perception: Coherent Willingness to Engage in Self- Medium Management Activities: Readiness to Engage in Self- Medium Management Activities: Electronic Signature(s) Signed: 09/13/2015 3:55:28 PM By: Elpidio Eric BSN, RN Entered By: Elpidio Eric on 09/13/2015 13:47:16 Keith Guzman ([id number]) -------------------------------------------------------------------------------- Fall Risk Assessment Details Patient Name: Keith Guzman. Date of Service: 09/13/2015 1:30 PM Medical Record Number:  [id number] Patient Account Number: [id number] Date of Birth/Sex: 30-Nov-1960 (54 y.o. Male) Treating RN: Clover Mealy, RN, BSN, [place] Primary Care Physician: Farris Has Other Clinician: Referring Physician: Farris Has Treating Physician/Extender: Rudene Re in Treatment: 0 Fall Risk Assessment Items Have you had 2 or more falls in the last 12 monthso 0 No Have you had any fall that resulted in injury in the last 12 monthso 0 No FALL RISK ASSESSMENT: History of falling - immediate or within 3 months 0 No Secondary diagnosis 0 No Ambulatory aid None/bed rest/wheelchair/nurse 0 Yes Crutches/cane/walker 0 No Furniture 0 No IV Access/Saline Lock  0 No Gait/Training Normal/bed rest/immobile 0 Yes Weak 0 No Impaired 0 No Mental Status Oriented to own ability 0 Yes Electronic Signature(s) Signed: 09/13/2015 3:55:28 PM By: Elpidio Eric BSN, RN Entered By: Elpidio Eric on 09/13/2015 13:46:45 Keith Guzman ([id number]) -------------------------------------------------------------------------------- Foot Assessment Details Patient Name: Keith Guzman. Date of Service: 09/13/2015 1:30 PM Medical Record Number: [id number] Patient Account Number: [id number] Date of Birth/Sex: 25-Oct-1960 (54 y.o. Male) Treating RN: Clover Mealy, RN, BSN, [place] Sink Primary Care Physician: Farris Has Other Clinician: Referring Physician: Farris Has Treating Physician/Extender: Rudene Re in Treatment: 0 Foot Assessment Items Site Locations + = Sensation present, - = Sensation absent, C = Callus, U = Ulcer R = Redness, W = Warmth, M = Maceration, PU = Pre-ulcerative lesion F = Fissure, S = Swelling, D = Dryness Assessment Right: Left: Other Deformity: No No Prior Foot Ulcer: No No Prior Amputation: No No Charcot Joint: No No Ambulatory Status: Ambulatory Without Help Gait: Steady Electronic Signature(s) Signed: 09/13/2015 3:55:28 PM By: Elpidio Eric BSN, RN Entered By: Elpidio Eric on 09/13/2015  13:46:24 Keith Guzman ([id number]) -------------------------------------------------------------------------------- Nutrition Risk Assessment Details Patient Name: Keith Guzman. Date of Service: 09/13/2015 1:30 PM Medical Record Number: [id number] Patient Account Number: [id number] Date of Birth/Sex: 11/04/1960 (54 y.o. Male) Treating RN: Clover Mealy, RN, BSN, [place] Primary Care Physician: Farris Has Other Clinician: Referring Physician: Farris Has Treating Physician/Extender: Rudene Re in Treatment: 0 Height (in): 68 Weight (lbs): 171 Body Mass Index (BMI): 26 Nutrition Risk Assessment Items NUTRITION RISK SCREEN: I have an illness or condition that made me change the kind and/or 0 No amount of food I eat I eat fewer than two meals per day 0 No I eat few fruits and vegetables, or milk products 0 No I have three or more drinks of beer, liquor or wine almost every day 0 No I have tooth or mouth problems that make it hard for me to eat 0 No I don't always have enough money to buy the food I need 0 No I eat alone most of the time 0 No I take three or more different prescribed or over-the-counter drugs a 0 No day Without wanting to, I have lost or gained 10 pounds in the last six 0 No months I am not always physically able to shop, cook and/or feed myself 0 No Nutrition Protocols Good Risk Protocol 0 No interventions needed Moderate Risk Protocol Electronic Signature(s) Signed: 09/13/2015 3:55:28 PM By: Elpidio Eric BSN, RN Entered By: Elpidio Eric on 09/13/2015 13:46:31

## 2015-09-14 NOTE — Progress Notes (Signed)
SERENITY, FORTNER ([id number]) Visit Report for 09/13/2015 Allergy List Details Patient Name: Keith Guzman, Keith Guzman. Date of Service: 09/13/2015 1:30 PM Medical Record Number: [id number] Patient Account Number: [id number] Date of Birth/Sex: 07/02/1961 (54 y.o. Male) Treating RN: Clover Mealy, RN, BSN, [place] Sink Primary Care Physician: Farris Has Other Clinician: Referring Physician: Farris Has Treating Physician/Extender: Rudene Re in Treatment: 0 Allergies Active Allergies no known allergies Allergy Notes Electronic Signature(s) Signed: 09/13/2015 3:55:28 PM By: Elpidio Eric BSN, RN Entered By: Elpidio Eric on 09/13/2015 13:48:15 Lanz, Keith Sprague ([id number]) -------------------------------------------------------------------------------- Arrival Information Details Patient Name: Keith Guzman. Date of Service: 09/13/2015 1:30 PM Medical Record Number: [id number] Patient Account Number: [id number] Date of Birth/Sex: 12-18-1960 (54 y.o. Male) Treating RN: Clover Mealy, RN, BSN, [place] Sink Primary Care Physician: Farris Has Other Clinician: Referring Physician: Farris Has Treating Physician/Extender: Rudene Re in Treatment: 0 Visit Information Patient Arrived: Ambulatory Arrival Time: 13:41 Accompanied By: wife Transfer Assistance: None Patient Identification Verified: Yes Secondary Verification Process Yes Completed: Patient Requires Transmission-Based No Precautions: Patient Has Alerts: Yes Patient Alerts: DM II Electronic Signature(s) Signed: 09/13/2015 3:55:28 PM By: Elpidio Eric BSN, RN Entered By: Elpidio Eric on 09/13/2015 13:44:59 Perkin, Keith Sprague ([id number]) -------------------------------------------------------------------------------- Clinic Level of Care Assessment Details Patient Name: Keith Guzman. Date of Service: 09/13/2015 1:30 PM Medical Record Number: [id number] Patient Account Number: [id number] Date of Birth/Sex: 1961-08-10 (54 y.o. Male) Treating RN: Clover Mealy, RN,  BSN,  Sink Primary Care Physician: Farris Has Other Clinician: Referring Physician: Farris Has Treating Physician/Extender: Rudene Re in Treatment: 0 Clinic Level of Care Assessment Items TOOL 1 Quantity Score  - Use when EandM and Procedure is performed on INITIAL visit 0 ASSESSMENTS - Nursing Assessment / Reassessment X - General Physical Exam (combine w/ comprehensive assessment (listed just 1 20 below) when performed on new pt. evals) X - Comprehensive Assessment (HX, ROS, Risk Assessments, Wounds Hx, etc.) 1 25 ASSESSMENTS - Wound and Skin Assessment / Reassessment  - Dermatologic / Skin Assessment (not related to wound area) 0 ASSESSMENTS - Ostomy and/or Continence Assessment and Care  - Incontinence Assessment and Management 0  - Ostomy Care Assessment and Management (repouching, etc.) 0 PROCESS - Coordination of Care X - Simple Patient / Family Education for ongoing care 1 15  - Complex (extensive) Patient / Family Education for ongoing care 0  - Staff obtains Chiropractor, Records, Test Results / Process Orders 0  - Staff telephones HHA, Nursing Homes / Clarify orders / etc 0  - Routine Transfer to another Facility (non-emergent condition) 0  - Routine Hospital Admission (non-emergent condition) 0 X - New Admissions / Manufacturing engineer / Ordering NPWT, Apligraf, etc. 1 15  - Emergency Hospital Admission (emergent condition) 0 PROCESS - Special Needs  - Pediatric / Minor Patient Management 0  - Isolation Patient Management 0 Lippard, Luccas M. ([id number])  - Hearing / Language / Visual special needs 0  - Assessment of Community assistance (transportation, D/C planning, etc.) 0  - Additional assistance / Altered mentation 0  - Support Surface(s) Assessment (bed, cushion, seat, etc.) 0 INTERVENTIONS - Miscellaneous  - External ear exam 0  - Patient Transfer (multiple staff / Nurse, adult / Similar devices) 0  - Simple  Staple / Suture removal (25 or less) 0  - Complex Staple / Suture removal (26 or more) 0  - Hypo/Hyperglycemic Management (do not check if billed separately) 0 X - Ankle / Brachial Index (ABI) - do not check if billed separately 1 15 Has the  patient been seen at the hospital within the last three years: Yes Total Score: 90 Level Of Care: New/Established - Level 3 Electronic Signature(s) Signed: 09/13/2015 3:55:28 PM By: Elpidio Eric BSN, RN Entered By: Elpidio Eric on 09/13/2015 14:41:39 Daquila, Keith Sprague ([id number]) -------------------------------------------------------------------------------- Encounter Discharge Information Details Patient Name: Keith Guzman. Date of Service: 09/13/2015 1:30 PM Medical Record Number: [id number] Patient Account Number: [id number] Date of Birth/Sex: 1961-05-13 (54 y.o. Male) Treating RN: Clover Mealy, RN, BSN, [place] Sink Primary Care Physician: Farris Has Other Clinician: Referring Physician: Farris Has Treating Physician/Extender: Rudene Re in Treatment: 0 Encounter Discharge Information Items Facility Notification Discharge Pain Level: 0 Telephoned: Yes Discharge Condition: Stable Orders Sent: Yes Ambulatory Status: Ambulatory Additional Notification Discharge Destination: Home Telephoned: Yes Transportation: Private Auto Orders Sent: Yes Accompanied By: wife Schedule Follow-up Appointment: No Medication Reconciliation completed and provided to Patient/Care No Claudell Wohler: Provided on Clinical Summary of Care: 09/13/2015 Form Type Recipient Paper Patient LL Electronic Signature(s) Signed: 09/13/2015 2:54:51 PM By: Gwenlyn Perking Entered By: Gwenlyn Perking on 09/13/2015 14:54:51 Flanigan, Keith Sprague ([id number]) -------------------------------------------------------------------------------- Lower Extremity Assessment Details Patient Name: Keith Guzman. Date of Service: 09/13/2015 1:30 PM Medical Record Number: [id number] Patient Account  Number: [id number] Date of Birth/Sex: September 05, 1961 (54 y.o. Male) Treating RN: Clover Mealy, RN, BSN, [place] Sink Primary Care Physician: Farris Has Other Clinician: Referring Physician: Farris Has Treating Physician/Extender: Rudene Re in Treatment: 0 Vascular Assessment Pulses: Posterior Tibial Dorsalis Pedis Palpable: [Right:Yes] Doppler: [Right:Multiphasic] Extremity colors, hair growth, and conditions: Extremity Color: [Right:Normal] Hair Growth on Extremity: [Right:Yes] Temperature of Extremity: [Right:Warm] Capillary Refill: [Right:< 3 seconds] Blood Pressure: Brachial: [Right:167] Dorsalis Pedis: [Left:Dorsalis Pedis: 130] Ankle: Posterior Tibial: 138 [Left:Posterior Tibial: 148 0.83] [Right:0.89] Toe Nail Assessment Left: Right: Thick: No Discolored: No Deformed: No Improper Length and Hygiene: No Electronic Signature(s) Signed: 09/13/2015 3:55:28 PM By: Elpidio Eric BSN, RN Entered By: Elpidio Eric on 09/13/2015 14:01:43 Buehler, Keith Sprague ([id number]) -------------------------------------------------------------------------------- Multi Wound Chart Details Patient Name: Keith Guzman. Date of Service: 09/13/2015 1:30 PM Medical Record Number: [id number] Patient Account Number: [id number] Date of Birth/Sex: 08/03/1961 (54 y.o. Male) Treating RN: Clover Mealy, RN, BSN, [place] Sink Primary Care Physician: Farris Has Other Clinician: Referring Physician: Farris Has Treating Physician/Extender: Rudene Re in Treatment: 0 Vital Signs Height(in): 68 Pulse(bpm): 78 Weight(lbs): 171 Blood Pressure 167/90 (mmHg): Body Mass Index(BMI): 26 Temperature(F): 98.4 Respiratory Rate 17 (breaths/min): Photos: [N/A:N/A] Wound Location: Right Toe Fifth - Lateral N/A N/A Wounding Event: Thermal Burn N/A N/A Primary Etiology: Diabetic Wound/Ulcer of N/A N/A the Lower Extremity Comorbid History: Hypertension, Type II N/A N/A Diabetes, Neuropathy Date Acquired: 08/27/2015  N/A N/A Weeks of Treatment: 0 N/A N/A Wound Status: Open N/A N/A Measurements L x W x D 3.5x1x0.1 N/A N/A (cm) Area (cm) : 2.749 N/A N/A Volume (cm) : 0.275 N/A N/A Classification: Grade 1 N/A N/A Exudate Amount: Medium N/A N/A Exudate Type: Serosanguineous N/A N/A Exudate Color: red, brown N/A N/A Wound Margin: Distinct, outline attached N/A N/A Necrotic Amount: N/A N/A N/A Exposed Structures: Fascia: No N/A N/A Fat: No Tendon: No Muscle: No Voller, Suleiman M. ([id number]) Joint: No Bone: No Limited to Skin Breakdown Epithelialization: None N/A N/A Periwound Skin Texture: Edema: No N/A N/A Excoriation: No Induration: No Callus: No Crepitus: No Fluctuance: No Friable: No Rash: No Scarring: No Periwound Skin Moist: Yes N/A N/A Moisture: Maceration: No Dry/Scaly: No Periwound Skin Color: Atrophie Blanche: No N/A N/A Cyanosis: No Ecchymosis: No Erythema: No Hemosiderin Staining: No Mottled: No Pallor: No Rubor:  No Temperature: No Abnormality N/A N/A Tenderness on No N/A N/A Palpation: Wound Preparation: Ulcer Cleansing: N/A N/A Rinsed/Irrigated with Saline Topical Anesthetic Applied: Other: lidocaine 4% Treatment Notes Electronic Signature(s) Signed: 09/13/2015 3:55:28 PM By: Elpidio Eric BSN, RN Entered By: Elpidio Eric on 09/13/2015 14:34:00 Knoll, Keith Sprague ([id number]) -------------------------------------------------------------------------------- Multi-Disciplinary Care Plan Details Patient Name: Keith Guzman. Date of Service: 09/13/2015 1:30 PM Medical Record Number: [id number] Patient Account Number: [id number] Date of Birth/Sex: 03/16/1961 (54 y.o. Male) Treating RN: Clover Mealy, RN, BSN, [place] Sink Primary Care Physician: Farris Has Other Clinician: Referring Physician: Farris Has Treating Physician/Extender: Rudene Re in Treatment: 0 Active Inactive Orientation to the Wound Care Program Nursing Diagnoses: Knowledge deficit related to the  wound healing center program Goals: Patient/caregiver will verbalize understanding of the Wound Healing Center Program Date Initiated: 09/13/2015 Goal Status: Active Interventions: Provide education on orientation to the wound center Notes: Wound/Skin Impairment Nursing Diagnoses: Impaired tissue integrity Knowledge deficit related to ulceration/compromised skin integrity Goals: Patient/caregiver will verbalize understanding of skin care regimen Date Initiated: 09/13/2015 Goal Status: Active Ulcer/skin breakdown will have a volume reduction of 30% by week 4 Date Initiated: 09/13/2015 Goal Status: Active Ulcer/skin breakdown will have a volume reduction of 50% by week 8 Date Initiated: 09/13/2015 Goal Status: Active Ulcer/skin breakdown will have a volume reduction of 80% by week 12 Date Initiated: 09/13/2015 Goal Status: Active Ulcer/skin breakdown will heal within 14 weeks Date Initiated: 09/13/2015 DIAR, BERKEL ([id number]) Goal Status: Active Interventions: Assess patient/caregiver ability to obtain necessary supplies Assess patient/caregiver ability to perform ulcer/skin care regimen upon admission and as needed Assess ulceration(s) every visit Provide education on ulcer and skin care Treatment Activities: Referred to DME Jakaiden Fill for dressing supplies : 09/13/2015 Skin care regimen initiated : 09/13/2015 Topical wound management initiated : 09/13/2015 Notes: Electronic Signature(s) Signed: 09/13/2015 3:55:28 PM By: Elpidio Eric BSN, RN Entered By: Elpidio Eric on 09/13/2015 14:31:48 Perlow, Keith Sprague ([id number]) -------------------------------------------------------------------------------- Pain Assessment Details Patient Name: Keith Guzman. Date of Service: 09/13/2015 1:30 PM Medical Record Number: [id number] Patient Account Number: [id number] Date of Birth/Sex: January 22, 1961 (54 y.o. Male) Treating RN: Clover Mealy, RN, BSN, [place] Sink Primary Care Physician: Farris Has Other  Clinician: Referring Physician: Farris Has Treating Physician/Extender: Rudene Re in Treatment: 0 Active Problems Location of Pain Severity and Description of Pain Patient Has Paino No Site Locations Pain Management and Medication Current Pain Management: Electronic Signature(s) Signed: 09/13/2015 3:55:28 PM By: Elpidio Eric BSN, RN Entered By: Elpidio Eric on 09/13/2015 13:45:09 Greenly, Keith Sprague ([id number]) -------------------------------------------------------------------------------- Patient/Caregiver Education Details Patient Name: Keith Guzman. Date of Service: 09/13/2015 1:30 PM Medical Record Number: [id number] Patient Account Number: [id number] Date of Birth/Gender: 1961-02-03 (54 y.o. Male) Treating RN: Clover Mealy, RN, BSN, [place] Sink Primary Care Physician: Farris Has Other Clinician: Referring Physician: Farris Has Treating Physician/Extender: Rudene Re in Treatment: 0 Education Assessment Education Provided To: Patient and Caregiver Education Topics Provided Welcome To The Wound Care Center: Methods: Explain/Verbal Responses: State content correctly Wound/Skin Impairment: Methods: Explain/Verbal Responses: State content correctly Electronic Signature(s) Signed: 09/13/2015 3:55:28 PM By: Elpidio Eric BSN, RN Entered By: Elpidio Eric on 09/13/2015 14:52:13 Mages, Keith Sprague ([id number]) -------------------------------------------------------------------------------- Wound Assessment Details Patient Name: Keith Guzman. Date of Service: 09/13/2015 1:30 PM Medical Record Number: [id number] Patient Account Number: [id number] Date of Birth/Sex: October 17, 1960 (54 y.o. Male) Treating RN: Clover Mealy, RN, BSN, [place] Sink Primary Care Physician: Farris Has Other Clinician: Referring Physician: Farris Has Treating Physician/Extender: Rudene Re in Treatment: 0 Wound Status Wound Number:  1 Primary Diabetic Wound/Ulcer of the Lower Etiology: Extremity Wound  Location: Right Toe Fifth - Lateral Wound Status: Open Wounding Event: Thermal Burn Comorbid Hypertension, Type II Diabetes, Date Acquired: 08/27/2015 History: Neuropathy Weeks Of Treatment: 0 Clustered Wound: No Photos Photo Uploaded By: Elpidio Eric on 09/13/2015 14:15:02 Wound Measurements Length: (cm) 3.5 Width: (cm) 1 Depth: (cm) 0.1 Area: (cm) 2.749 Volume: (cm) 0.275 % Reduction in Area: % Reduction in Volume: Epithelialization: None Tunneling: No Undermining: No Wound Description Classification: Grade 1 Wound Margin: Distinct, outline attached Exudate Amount: Medium Exudate Type: Serosanguineous Exudate Color: red, brown Foul Odor After Cleansing: No Wound Bed Necrotic Amount: Large (67-100%) Exposed Structure Necrotic Quality: Adherent Slough Fascia Exposed: No Fat Layer Exposed: No Tendon Exposed: No Zollars, Luca M. ([id number]) Muscle Exposed: No Joint Exposed: No Bone Exposed: No Limited to Skin Breakdown Periwound Skin Texture Texture Color No Abnormalities Noted: No No Abnormalities Noted: No Callus: No Atrophie Blanche: No Crepitus: No Cyanosis: No Excoriation: No Ecchymosis: No Fluctuance: No Erythema: No Friable: No Hemosiderin Staining: No Induration: No Mottled: No Localized Edema: No Pallor: No Rash: No Rubor: No Scarring: No Temperature / Pain Moisture Temperature: No Abnormality No Abnormalities Noted: No Dry / Scaly: No Maceration: No Moist: Yes Wound Preparation Ulcer Cleansing: Rinsed/Irrigated with Saline Topical Anesthetic Applied: Other: lidocaine 4%, Treatment Notes Wound #1 (Right, Lateral Toe Fifth) 1. Cleansed with: Clean wound with Normal Saline 4. Dressing Applied: Santyl Ointment 5. Secondary Dressing Applied Gauze and Kerlix/Conform Electronic Signature(s) Signed: 09/13/2015 3:55:28 PM By: Elpidio Eric BSN, RN Entered By: Elpidio Eric on 09/13/2015 13:55:09 Marmolejos, Keith Sprague  ([id number]) -------------------------------------------------------------------------------- Vitals Details Patient Name: Keith Guzman. Date of Service: 09/13/2015 1:30 PM Medical Record Number: [id number] Patient Account Number: [id number] Date of Birth/Sex: 1961/07/29 (55 y.o. Male) Treating RN: Afful, RN, BSN, [place] Primary Care Physician: Farris Has Other Clinician: Referring Physician: Farris Has Treating Physician/Extender: Rudene Re in Treatment: 0 Vital Signs Time Taken: 13:45 Temperature (F): 98.4 Height (in): 68 Pulse (bpm): 78 Source: Stated Respiratory Rate (breaths/min): 17 Weight (lbs): 171 Blood Pressure (mmHg): 167/90 Source: Stated Reference Range: 80 - 120 mg / dl Body Mass Index (BMI): 26 Electronic Signature(s) Signed: 09/13/2015 3:55:28 PM By: Elpidio Eric BSN, RN Entered By: Elpidio Eric on 09/13/2015 13:46:04

## 2015-09-19 ENCOUNTER — Encounter: Payer: 59 | Admitting: Surgery

## 2015-09-19 DIAGNOSIS — E11621 Type 2 diabetes mellitus with foot ulcer: Secondary | ICD-10-CM | POA: Diagnosis not present
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
## 2015-09-20 NOTE — Progress Notes (Signed)
Keith Guzman ([id number]) Visit Report for 09/19/2015 Arrival Information Details Patient Name: Keith Guzman, Keith Guzman. Date of Service: 09/19/2015 3:45 PM Medical Record Number: [id number] Patient Account Number: [id number] Date of Birth/Sex: 05-May-1961 (54 y.o. Male) Treating RN: Phillis Haggis Primary Care Physician: Farris Has Other Clinician: Referring Physician: Farris Has Treating Physician/Extender: Rudene Re in Treatment: 0 Visit Information History Since Last Visit All ordered tests and consults were completed: No Patient Arrived: Ambulatory Added or deleted any medications: No Arrival Time: 15:50 Any new allergies or adverse reactions: No Accompanied By: wife Had a fall or experienced change in No Transfer Assistance: None activities of daily living that may affect Patient Identification Verified: Yes risk of falls: Secondary Verification Process Yes Signs or symptoms of abuse/neglect since last No Completed: visito Patient Requires Transmission-Based No Hospitalized since last visit: No Precautions: Pain Present Now: No Patient Has Alerts: Yes Patient Alerts: DM II Electronic Signature(s) Signed: 09/19/2015 5:45:13 PM By: Alejandro Mulling Entered By: Alejandro Mulling on 09/19/2015 15:50:52 Keith Guzman ([id number]) -------------------------------------------------------------------------------- Clinic Level of Care Assessment Details Patient Name: Keith Guzman. Date of Service: 09/19/2015 3:45 PM Medical Record Number: [id number] Patient Account Number: [id number] Date of Birth/Sex: [birth year] (54 y.o. Male) Treating RN: Phillis Haggis Primary Care Physician: Farris Has Other Clinician: Referring Physician: Farris Has Treating Physician/Extender: Rudene Re in Treatment: 0 Clinic Level of Care Assessment Items TOOL 4 Quantity Score []  - Use when only an EandM is performed on FOLLOW-UP visit 0 ASSESSMENTS - Nursing Assessment /  Reassessment []  - Reassessment of Co-morbidities (includes updates in patient status) 0 X - Reassessment of Adherence to Treatment Plan 1 5 ASSESSMENTS - Wound and Skin Assessment / Reassessment X - Simple Wound Assessment / Reassessment - one wound 1 5 []  - Complex Wound Assessment / Reassessment - multiple wounds 0 []  - Dermatologic / Skin Assessment (not related to wound area) 0 ASSESSMENTS - Focused Assessment []  - Circumferential Edema Measurements - multi extremities 0 []  - Nutritional Assessment / Counseling / Intervention 0 []  - Lower Extremity Assessment (monofilament, tuning fork, pulses) 0 []  - Peripheral Arterial Disease Assessment (using hand held doppler) 0 ASSESSMENTS - Ostomy and/or Continence Assessment and Care []  - Incontinence Assessment and Management 0 []  - Ostomy Care Assessment and Management (repouching, etc.) 0 PROCESS - Coordination of Care X - Simple Patient / Family Education for ongoing care 1 15 []  - Complex (extensive) Patient / Family Education for ongoing care 0 []  - Staff obtains Chiropractor, Records, Test Results / Process Orders 0 []  - Staff telephones HHA, Nursing Homes / Clarify orders / etc 0 []  - Routine Transfer to another Facility (non-emergent condition) 0 Gerstner, Graeme M. ([id number]) []  - Routine Hospital Admission (non-emergent condition) 0 []  - New Admissions / Manufacturing engineer / Ordering NPWT, Apligraf, etc. 0 []  - Emergency Hospital Admission (emergent condition) 0 X - Simple Discharge Coordination 1 10 []  - Complex (extensive) Discharge Coordination 0 PROCESS - Special Needs []  - Pediatric / Minor Patient Management 0 []  - Isolation Patient Management 0 []  - Hearing / Language / Visual special needs 0 []  - Assessment of Community assistance (transportation, D/C planning, etc.) 0 []  - Additional assistance / Altered mentation 0 []  - Support Surface(s) Assessment (bed, cushion, seat, etc.) 0 INTERVENTIONS - Wound Cleansing /  Measurement X - Simple Wound Cleansing - one wound 1 5 []  - Complex Wound Cleansing - multiple wounds 0 X - Wound Imaging (photographs - any number of wounds) 1  5  - Wound Tracing (instead of photographs) 0 X - Simple Wound Measurement - one wound 1 5  - Complex Wound Measurement - multiple wounds 0 INTERVENTIONS - Wound Dressings X - Small Wound Dressing one or multiple wounds 1 10  - Medium Wound Dressing one or multiple wounds 0  - Large Wound Dressing one or multiple wounds 0 X - Application of Medications - topical 1 5  - Application of Medications - injection 0 INTERVENTIONS - Miscellaneous  - External ear exam 0 Grabbe, Aidian M. ([id number])  - Specimen Collection (cultures, biopsies, blood, body fluids, etc.) 0  - Specimen(s) / Culture(s) sent or taken to Lab for analysis 0  - Patient Transfer (multiple staff / Michiel Sites Lift / Similar devices) 0  - Simple Staple / Suture removal (25 or less) 0  - Complex Staple / Suture removal (26 or more) 0  - Hypo / Hyperglycemic Management (close monitor of Blood Glucose) 0  - Ankle / Brachial Index (ABI) - do not check if billed separately 0 X - Vital Signs 1 5 Has the patient been seen at the hospital within the last three years: Yes Total Score: 70 Level Of Care: New/Established - Level 2 Electronic Signature(s) Signed: 09/19/2015 5:45:13 PM By: Alejandro Mulling Entered By: Alejandro Mulling on 09/19/2015 16:17:03 Keith Guzman ([id number]) -------------------------------------------------------------------------------- Encounter Discharge Information Details Patient Name: Keith Guzman. Date of Service: 09/19/2015 3:45 PM Medical Record Number: [id number] Patient Account Number: [id number] Date of Birth/Sex: 1960-10-15 (53 y.o. Male) Treating RN: Phillis Haggis Primary Care Physician: Farris Has Other Clinician: Referring Physician: Farris Has Treating Physician/Extender: Rudene Re in Treatment:  0 Encounter Discharge Information Items Discharge Pain Level: 0 Discharge Condition: Stable Ambulatory Status: Ambulatory Discharge Destination: Home Transportation: Private Auto Accompanied By: wife Schedule Follow-up Appointment: Yes Medication Reconciliation completed and provided to Patient/Care Yes Devron Cohick: Provided on Clinical Summary of Care: 09/19/2015 Form Type Recipient Paper Patient LL Electronic Signature(s) Signed: 09/19/2015 4:28:07 PM By: Gwenlyn Perking Entered By: Gwenlyn Perking on 09/19/2015 16:28:07 Cuffie, Benjamine Guzman ([id number]) -------------------------------------------------------------------------------- Lower Extremity Assessment Details Patient Name: Keith Guzman. Date of Service: 09/19/2015 3:45 PM Medical Record Number: [id number] Patient Account Number: [id number] Date of Birth/Sex: [birth year] (54 y.o. Male) Treating RN: Phillis Haggis Primary Care Physician: Farris Has Other Clinician: Referring Physician: Farris Has Treating Physician/Extender: Rudene Re in Treatment: 0 Vascular Assessment Pulses: Posterior Tibial Dorsalis Pedis Palpable: [Right:Yes] Extremity colors, hair growth, and conditions: Extremity Color: [Right:Normal] Hair Growth on Extremity: [Right:No] Temperature of Extremity: [Right:Warm] Capillary Refill: [Right:< 3 seconds] Toe Nail Assessment Left: Right: Thick: No Discolored: No Deformed: No Improper Length and Hygiene: No Electronic Signature(s) Signed: 09/19/2015 5:45:13 PM By: Alejandro Mulling Entered By: Alejandro Mulling on 09/19/2015 15:57:25 Coughlin, Benjamine Guzman ([id number]) -------------------------------------------------------------------------------- Multi Wound Chart Details Patient Name: Keith Guzman. Date of Service: 09/19/2015 3:45 PM Medical Record Number: [id number] Patient Account Number: [id number] Date of Birth/Sex: [birth year] (54 y.o. Male) Treating RN: Phillis Haggis Primary Care  Physician: Farris Has Other Clinician: Referring Physician: Farris Has Treating Physician/Extender: Rudene Re in Treatment: 0 Vital Signs Height(in): 68 Pulse(bpm): 83 Weight(lbs): 171 Blood Pressure 151/78 (mmHg): Body Mass Index(BMI): 26 Temperature(F): 98.3 Respiratory Rate 18 (breaths/min): Photos: [1:No Photos] [N/A:N/A] Wound Location: [1:Right Toe Fifth - Lateral] [N/A:N/A] Wounding Event: [1:Thermal Burn] [N/A:N/A] Primary Etiology: [1:Diabetic Wound/Ulcer of the Lower Extremity] [N/A:N/A] Comorbid History: [1:Hypertension, Type II Diabetes, Neuropathy] [N/A:N/A] Date Acquired: [1:08/27/2015] [N/A:N/A] Weeks of Treatment: [1:0] [N/A:N/A] Wound Status: [1:Open] [N/A:N/A] Measurements  L x W x D 0.9x3.3x0.1 [N/A:N/A] (cm) Area (cm) : [1:2.333] [N/A:N/A] Volume (cm) : [1:0.233] [N/A:N/A] % Reduction in Area: [1:15.10%] [N/A:N/A] % Reduction in Volume: 15.30% [N/A:N/A] Classification: [1:Grade 1] [N/A:N/A] Exudate Amount: [1:Medium] [N/A:N/A] Exudate Type: [1:Serosanguineous] [N/A:N/A] Exudate Color: [1:red, brown] [N/A:N/A] Wound Margin: [1:Distinct, outline attached] [N/A:N/A] Granulation Amount: [1:Small (1-33%)] [N/A:N/A] Granulation Quality: [1:Pink] [N/A:N/A] Necrotic Amount: [1:Large (67-100%)] [N/A:N/A] Exposed Structures: [1:Fascia: No Fat: No Tendon: No Muscle: No Joint: No Bone: No] [N/A:N/A] Limited to Skin Breakdown Epithelialization: None N/A N/A Periwound Skin Texture: Edema: No N/A N/A Excoriation: No Induration: No Callus: No Crepitus: No Fluctuance: No Friable: No Rash: No Scarring: No Periwound Skin Moist: Yes N/A N/A Moisture: Maceration: No Dry/Scaly: No Periwound Skin Color: Atrophie Blanche: No N/A N/A Cyanosis: No Ecchymosis: No Erythema: No Hemosiderin Staining: No Mottled: No Pallor: No Rubor: No Temperature: No Abnormality N/A N/A Tenderness on No N/A N/A Palpation: Wound Preparation: Ulcer  Cleansing: N/A N/A Rinsed/Irrigated with Saline Topical Anesthetic Applied: Other: lidocaine 4% Treatment Notes Electronic Signature(s) Signed: 09/19/2015 5:45:13 PM By: Alejandro Mulling Entered By: Alejandro Mulling on 09/19/2015 16:00:15 Fehring, Benjamine Guzman ([id number]) -------------------------------------------------------------------------------- Multi-Disciplinary Care Plan Details Patient Name: Keith Guzman. Date of Service: 09/19/2015 3:45 PM Medical Record Number: [id number] Patient Account Number: [id number] Date of Birth/Sex: 1961/04/28 (54 y.o. Male) Treating RN: Phillis Haggis Primary Care Physician: Farris Has Other Clinician: Referring Physician: Farris Has Treating Physician/Extender: Rudene Re in Treatment: 0 Active Inactive Orientation to the Wound Care Program Nursing Diagnoses: Knowledge deficit related to the wound healing center program Goals: Patient/caregiver will verbalize understanding of the Wound Healing Center Program Date Initiated: 09/13/2015 Goal Status: Active Interventions: Provide education on orientation to the wound center Notes: Wound/Skin Impairment Nursing Diagnoses: Impaired tissue integrity Knowledge deficit related to ulceration/compromised skin integrity Goals: Patient/caregiver will verbalize understanding of skin care regimen Date Initiated: 09/13/2015 Goal Status: Active Ulcer/skin breakdown will have a volume reduction of 30% by week 4 Date Initiated: 09/13/2015 Goal Status: Active Ulcer/skin breakdown will have a volume reduction of 50% by week 8 Date Initiated: 09/13/2015 Goal Status: Active Ulcer/skin breakdown will have a volume reduction of 80% by week 12 Date Initiated: 09/13/2015 Goal Status: Active Ulcer/skin breakdown will heal within 14 weeks Date Initiated: 09/13/2015 LATTIE, CERVI ([id number]) Goal Status: Active Interventions: Assess patient/caregiver ability to obtain necessary supplies Assess  patient/caregiver ability to perform ulcer/skin care regimen upon admission and as needed Assess ulceration(s) every visit Provide education on ulcer and skin care Treatment Activities: Referred to DME Aimee Heldman for dressing supplies : 09/19/2015 Skin care regimen initiated : 09/19/2015 Topical wound management initiated : 09/19/2015 Notes: Electronic Signature(s) Signed: 09/19/2015 5:45:13 PM By: Alejandro Mulling Entered By: Alejandro Mulling on 09/19/2015 16:00:10 Rampersad, Benjamine Guzman ([id number]) -------------------------------------------------------------------------------- Pain Assessment Details Patient Name: Keith Guzman. Date of Service: 09/19/2015 3:45 PM Medical Record Number: [id number] Patient Account Number: [id number] Date of Birth/Sex: 1960/12/31 (54 y.o. Male) Treating RN: Phillis Haggis Primary Care Physician: Farris Has Other Clinician: Referring Physician: Farris Has Treating Physician/Extender: Rudene Re in Treatment: 0 Active Problems Location of Pain Severity and Description of Pain Patient Has Paino No Site Locations Pain Management and Medication Current Pain Management: Electronic Signature(s) Signed: 09/19/2015 5:45:13 PM By: Alejandro Mulling Entered By: Alejandro Mulling on 09/19/2015 15:50:58 Beaumont, Benjamine Guzman ([id number]) -------------------------------------------------------------------------------- Patient/Caregiver Education Details Patient Name: Keith Guzman. Date of Service: 09/19/2015 3:45 PM Medical Record Number: [id number] Patient Account Number: [id number] Date of Birth/Gender: 1961/03/30 (54 y.o. Male) Treating  RN: Phillis Haggis Primary Care Physician: Farris Has Other Clinician: Referring Physician: Farris Has Treating Physician/Extender: Rudene Re in Treatment: 0 Education Assessment Education Provided To: Patient and Caregiver Education Topics Provided Wound/Skin Impairment: Handouts: Other: change  dressing as directed Methods: Demonstration, Explain/Verbal Responses: State content correctly Electronic Signature(s) Signed: 09/19/2015 5:45:13 PM By: Alejandro Mulling Entered By: Alejandro Mulling on 09/19/2015 16:18:35 Vigeant, Benjamine Guzman ([id number]) -------------------------------------------------------------------------------- Wound Assessment Details Patient Name: Keith Guzman. Date of Service: 09/19/2015 3:45 PM Medical Record Number: [id number] Patient Account Number: [id number] Date of Birth/Sex: 1961-01-02 (54 y.o. Male) Treating RN: Phillis Haggis Primary Care Physician: Farris Has Other Clinician: Referring Physician: Farris Has Treating Physician/Extender: Rudene Re in Treatment: 0 Wound Status Wound Number: 1 Primary Diabetic Wound/Ulcer of the Lower Etiology: Extremity Wound Location: Right Toe Fifth - Lateral Wound Status: Open Wounding Event: Thermal Burn Comorbid Hypertension, Type II Diabetes, Date Acquired: 08/27/2015 History: Neuropathy Weeks Of Treatment: 0 Clustered Wound: No Photos Photo Uploaded By: Alejandro Mulling on 09/19/2015 16:54:04 Wound Measurements Length: (cm) 0.9 Width: (cm) 3.3 Depth: (cm) 0.1 Area: (cm) 2.333 Volume: (cm) 0.233 % Reduction in Area: 15.1% % Reduction in Volume: 15.3% Epithelialization: None Tunneling: No Undermining: No Wound Description Classification: Grade 1 Wound Margin: Distinct, outline attached Exudate Amount: Medium Exudate Type: Serosanguineous Exudate Color: red, brown Foul Odor After Cleansing: No Wound Bed Granulation Amount: Small (1-33%) Exposed Structure Granulation Quality: Pink Fascia Exposed: No Necrotic Amount: Large (67-100%) Fat Layer Exposed: No Necrotic Quality: Adherent Slough Tendon Exposed: No Oldenkamp, Francois M. ([id number]) Muscle Exposed: No Joint Exposed: No Bone Exposed: No Limited to Skin Breakdown Periwound Skin Texture Texture Color No Abnormalities Noted:  No No Abnormalities Noted: No Callus: No Atrophie Blanche: No Crepitus: No Cyanosis: No Excoriation: No Ecchymosis: No Fluctuance: No Erythema: No Friable: No Hemosiderin Staining: No Induration: No Mottled: No Localized Edema: No Pallor: No Rash: No Rubor: No Scarring: No Temperature / Pain Moisture Temperature: No Abnormality No Abnormalities Noted: No Dry / Scaly: No Maceration: No Moist: Yes Wound Preparation Ulcer Cleansing: Rinsed/Irrigated with Saline Topical Anesthetic Applied: Other: lidocaine 4%, Treatment Notes Wound #1 (Right, Lateral Toe Fifth) 1. Cleansed with: Clean wound with Normal Saline 2. Anesthetic Topical Lidocaine 4% cream to wound bed prior to debridement 4. Dressing Applied: Santyl Ointment 5. Secondary Dressing Applied Gauze and Kerlix/Conform 6. Footwear/Offloading device applied Other footwear/offloading device applied (specify in notes) Notes darco Electronic Signature(s) Signed: 09/19/2015 5:45:13 PM By: Alejandro Mulling Entered By: Alejandro Mulling on 09/19/2015 15:58:41 Mungia, GAVINO MONTANTE ([id number]) Klunk, Benjamine Guzman ([id number]) -------------------------------------------------------------------------------- Vitals Details Patient Name: Keith Guzman. Date of Service: 09/19/2015 3:45 PM Medical Record Number: [id number] Patient Account Number: [id number] Date of Birth/Sex: March 31, 1961 (54 y.o. Male) Treating RN: Phillis Haggis Primary Care Physician: Farris Has Other Clinician: Referring Physician: Farris Has Treating Physician/Extender: Rudene Re in Treatment: 0 Vital Signs Time Taken: 15:51 Temperature (F): 98.3 Height (in): 68 Pulse (bpm): 83 Weight (lbs): 171 Respiratory Rate (breaths/min): 18 Body Mass Index (BMI): 26 Blood Pressure (mmHg): 151/78 Reference Range: 80 - 120 mg / dl Electronic Signature(s) Signed: 09/19/2015 5:45:13 PM By: Alejandro Mulling Entered By: Alejandro Mulling on 09/19/2015  15:52:56

## 2015-09-24 ENCOUNTER — Other Ambulatory Visit (HOSPITAL_BASED_OUTPATIENT_CLINIC_OR_DEPARTMENT_OTHER): Payer: 59

## 2015-09-24 ENCOUNTER — Ambulatory Visit (HOSPITAL_BASED_OUTPATIENT_CLINIC_OR_DEPARTMENT_OTHER): Payer: 59

## 2015-09-24 VITALS — BP 149/78 | HR 87 | Temp 98.6°F

## 2015-09-24 DIAGNOSIS — D649 Anemia, unspecified: Secondary | ICD-10-CM | POA: Diagnosis not present

## 2015-09-24 DIAGNOSIS — D631 Anemia in chronic kidney disease: Secondary | ICD-10-CM

## 2015-09-24 DIAGNOSIS — N189 Chronic kidney disease, unspecified: Secondary | ICD-10-CM | POA: Diagnosis not present

## 2015-09-24 DIAGNOSIS — D638 Anemia in other chronic diseases classified elsewhere: Secondary | ICD-10-CM

## 2015-09-24 LAB — CBC & DIFF AND RETIC
BASO%: 0.2 % (ref 0.0–2.0)
BASOS ABS: 0 10*3/uL (ref 0.0–0.1)
EOS ABS: 0.3 10*3/uL (ref 0.0–0.5)
EOS%: 3.2 % (ref 0.0–7.0)
HEMATOCRIT: 31.9 % — AB (ref 38.4–49.9)
HEMOGLOBIN: 9.8 g/dL — AB (ref 13.0–17.1)
IMMATURE RETIC FRACT: 4.1 % (ref 3.00–10.60)
LYMPH#: 1.5 10*3/uL (ref 0.9–3.3)
LYMPH%: 17.8 % (ref 14.0–49.0)
MCH: 23.7 pg — AB (ref 27.2–33.4)
MCHC: 30.7 g/dL — AB (ref 32.0–36.0)
MCV: 77.2 fL — ABNORMAL LOW (ref 79.3–98.0)
MONO#: 0.8 10*3/uL (ref 0.1–0.9)
MONO%: 8.8 % (ref 0.0–14.0)
NEUT#: 6 10*3/uL (ref 1.5–6.5)
NEUT%: 70 % (ref 39.0–75.0)
PLATELETS: 299 10*3/uL (ref 140–400)
RBC: 4.13 10*6/uL — AB (ref 4.20–5.82)
RDW: 15.9 % — ABNORMAL HIGH (ref 11.0–14.6)
RETIC %: 0.81 % (ref 0.80–1.80)
RETIC CT ABS: 33.45 10*3/uL — AB (ref 34.80–93.90)
WBC: 8.5 10*3/uL (ref 4.0–10.3)
nRBC: 0 % (ref 0–0)

## 2015-09-24 MED ORDER — DARBEPOETIN ALFA 150 MCG/0.3ML IJ SOSY
150.0000 ug | PREFILLED_SYRINGE | Freq: Once | INTRAMUSCULAR | Status: AC
  Administered 2015-09-24: 150 ug via SUBCUTANEOUS
  Filled 2015-09-24: qty 0.3

## 2015-09-25 MED ORDER — PALONOSETRON HCL INJECTION 0.25 MG/5ML
INTRAVENOUS | Status: AC
  Filled 2015-09-25: qty 5

## 2015-09-25 MED ORDER — ATROPINE SULFATE 1 MG/ML IJ SOLN
INTRAMUSCULAR | Status: AC
  Filled 2015-09-25: qty 1

## 2015-09-26 ENCOUNTER — Encounter (HOSPITAL_BASED_OUTPATIENT_CLINIC_OR_DEPARTMENT_OTHER): Payer: 59 | Admitting: General Surgery

## 2015-09-26 ENCOUNTER — Encounter: Payer: Self-pay | Admitting: General Surgery

## 2015-09-26 DIAGNOSIS — I779 Disorder of arteries and arterioles, unspecified: Secondary | ICD-10-CM

## 2015-09-26 DIAGNOSIS — L97502 Non-pressure chronic ulcer of other part of unspecified foot with fat layer exposed: Secondary | ICD-10-CM | POA: Diagnosis not present

## 2015-09-26 DIAGNOSIS — E11621 Type 2 diabetes mellitus with foot ulcer: Secondary | ICD-10-CM | POA: Diagnosis not present

## 2015-09-26 NOTE — Progress Notes (Signed)
seeiheal 

## 2015-09-27 NOTE — Progress Notes (Signed)
Keith Guzman, Keith Guzman ([id number]) Visit Report for 09/26/2015 Arrival Information Details Patient Name: [name], Guzman. Date of Service: 09/26/2015 8:45 AM Medical Record Number: [id number] Patient Account Number: [id number] Date of Birth/Sex: Jan 30, 1961 (54 y.o. Male) Treating RN: Clover Mealy, RN, BSN, [place] Sink Primary Care Physician: Farris Has Other Clinician: Referring Physician: Farris Has Treating Physician/Extender: Elayne Snare in Treatment: 1 Visit Information History Since Last Visit Added or deleted any medications: No Patient Arrived: Ambulatory Any new allergies or adverse reactions: No Arrival Time: 08:27 Had a fall or experienced change in No Accompanied By: wife activities of daily living that may affect Transfer Assistance: None risk of falls: Patient Identification Verified: Yes Signs or symptoms of abuse/neglect since last No Secondary Verification Process Yes visito Completed: Has Dressing in Place as Prescribed: Yes Patient Requires Transmission-Based No Pain Present Now: No Precautions: Patient Has Alerts: Yes Patient Alerts: DM II Electronic Signature(s) Signed: 09/26/2015 3:28:31 PM By: Elpidio Eric BSN, RN Entered By: Elpidio Eric on 09/26/2015 08:31:15 Keith Guzman ([id number]) -------------------------------------------------------------------------------- Encounter Discharge Information Details Patient Name: Keith Guzman. Date of Service: 09/26/2015 8:45 AM Medical Record Number: [id number] Patient Account Number: [id number] Date of Birth/Sex: Nov 23, 1960 (54 y.o. Male) Treating RN: Clover Mealy, RN, BSN, [place] Sink Primary Care Physician: Farris Has Other Clinician: Referring Physician: Farris Has Treating Physician/Extender: Elayne Snare in Treatment: 1 Encounter Discharge Information Items Discharge Pain Level: 0 Discharge Condition: Stable Ambulatory Status: Ambulatory Discharge Destination: Home Private Transportation: Auto Accompanied By:  wife Schedule Follow-up Appointment: No Medication Reconciliation completed and No provided to Patient/Care Stepheny Canal: Clinical Summary of Care: Electronic Signature(s) Signed: 09/26/2015 8:59:05 AM By: Ardath Sax MD Entered By: Ardath Sax on 09/26/2015 08:59:05 [name], Benjamine Guzman ([id number]) -------------------------------------------------------------------------------- Lower Extremity Assessment Details Patient Name: Keith Guzman. Date of Service: 09/26/2015 8:45 AM Medical Record Number: [id number] Patient Account Number: [id number] Date of Birth/Sex: 1961-08-29 (54 y.o. Male) Treating RN: Clover Mealy, RN, BSN, [place] Sink Primary Care Physician: Farris Has Other Clinician: Referring Physician: Farris Has Treating Physician/Extender: Elayne Snare in Treatment: 1 Vascular Assessment Pulses: Posterior Tibial Dorsalis Pedis Palpable: [Right:No] Doppler: [Right:Monophasic] Extremity colors, hair growth, and conditions: Extremity Color: [Right:Normal] Hair Growth on Extremity: [Right:No] Temperature of Extremity: [Right:Warm] Toe Nail Assessment Left: Right: Thick: No Discolored: No Deformed: No Improper Length and Hygiene: No Electronic Signature(s) Signed: 09/26/2015 3:28:31 PM By: Elpidio Eric BSN, RN Entered By: Elpidio Eric on 09/26/2015 08:32:36 Keith Guzman ([id number]) -------------------------------------------------------------------------------- Multi Wound Chart Details Patient Name: Keith Guzman. Date of Service: 09/26/2015 8:45 AM Medical Record Number: [id number] Patient Account Number: [id number] Date of Birth/Sex: Nov 06, 1960 (54 y.o. Male) Treating RN: Clover Mealy, RN, BSN,  [place] Primary Care Physician: Farris Has Other Clinician: Referring Physician: Farris Has Treating Physician/Extender: Elayne Snare in Treatment: 1 Vital Signs Height(in): 68 Pulse(bpm): 83 Weight(lbs): 171 Blood Pressure 141/71 (mmHg): Body Mass Index(BMI):  26 Temperature(F): 98.3 Respiratory Rate 18 (breaths/min): Photos: [1:No Photos] [N/A:N/A] Wound Location: [1:Right, Lateral Toe Fifth] [N/A:N/A] Wounding Event: [1:Thermal Burn] [N/A:N/A] Primary Etiology: [1:Diabetic Wound/Ulcer of the Lower Extremity] [N/A:N/A] Date Acquired: [1:08/27/2015] [N/A:N/A] Weeks of Treatment: [1:1] [N/A:N/A] Wound Status: [1:Open] [N/A:N/A] Measurements L x W x D 0.5x3.4x0.1 [N/A:N/A] (cm) Area (cm) : [1:1.335] [N/A:N/A] Volume (cm) : [1:0.134] [N/A:N/A] % Reduction in Area: [1:51.40%] [N/A:N/A] % Reduction in Volume: 51.30% [N/A:N/A] Classification: [1:Grade 1] [N/A:N/A] Periwound Skin Texture: No Abnormalities Noted [N/A:N/A] Periwound Skin [1:No Abnormalities Noted] [N/A:N/A] Moisture: Periwound Skin Color: No Abnormalities Noted [N/A:N/A] Tenderness on [1:No] [N/A:N/A] Treatment Notes Electronic Signature(s) Signed: 09/26/2015 3:28:31  PM By: Elpidio Eric BSN, RN Entered By: Elpidio Eric on 09/26/2015 08:39:47 Keith Guzman ([name] ([id number]) -------------------------------------------------------------------------------- Multi-Disciplinary Care Plan Details Patient Name: Keith Guzman. Date of Service: 09/26/2015 8:45 AM Medical Record Number: [id number] Patient Account Number: [id number] Date of Birth/Sex: September 12, 1961 (54 y.o. Male) Treating RN: Clover Mealy, RN, BSN, [place] Sink Primary Care Physician: Farris Has Other Clinician: Referring Physician: Farris Has Treating Physician/Extender: Elayne Snare in Treatment: 1 Active Inactive Orientation to the Wound Care Program Nursing Diagnoses: Knowledge deficit related to the wound healing center program Goals: Patient/caregiver will verbalize understanding of the Wound Healing Center Program Date Initiated: 09/13/2015 Goal Status: Active Interventions: Provide education on orientation to the wound center Notes: Wound/Skin Impairment Nursing Diagnoses: Impaired  tissue integrity Knowledge deficit related to ulceration/compromised skin integrity Goals: Patient/caregiver will verbalize understanding of skin care regimen Date Initiated: 09/13/2015 Goal Status: Active Ulcer/skin breakdown will have a volume reduction of 30% by week 4 Date Initiated: 09/13/2015 Goal Status: Active Ulcer/skin breakdown will have a volume reduction of 50% by week 8 Date Initiated: 09/13/2015 Goal Status: Active Ulcer/skin breakdown will have a volume reduction of 80% by week 12 Date Initiated: 09/13/2015 Goal Status: Active Ulcer/skin breakdown will heal within 14 weeks Date Initiated: 09/13/2015 JAKELL, DELSORDO ([id number]) Goal Status: Active Interventions: Assess patient/caregiver ability to obtain necessary supplies Assess patient/caregiver ability to perform ulcer/skin care regimen upon admission and as needed Assess ulceration(s) every visit Provide education on ulcer and skin care Treatment Activities: Referred to DME Demmi Sindt for dressing supplies : 09/26/2015 Skin care regimen initiated : 09/26/2015 Topical wound management initiated : 09/26/2015 Notes: Electronic Signature(s) Signed: 09/26/2015 3:28:31 PM By: Elpidio Eric BSN, RN Entered By: Elpidio Eric on 09/26/2015 08:39:00 Orban, Benjamine Guzman ([id number]) -------------------------------------------------------------------------------- Patient/Caregiver Education Details Patient Name: Keith Guzman. Date of Service: 09/26/2015 8:45 AM Medical Record Number: [id number] Patient Account Number: [id number] Date of Birth/Gender: 11-11-1960 (54 y.o. Male) Treating RN: Clover Mealy, RN, BSN, [place] Sink Primary Care Physician: Farris Has Other Clinician: Referring Physician: Farris Has Treating Physician/Extender: Elayne Snare in Treatment: 1 Education Assessment Education Provided To: Patient Education Topics Provided Welcome To The Wound Care Center: Methods: Explain/Verbal Responses: State content  correctly Wound/Skin Impairment: Methods: Explain/Verbal Responses: State content correctly Electronic Signature(s) Signed: 09/26/2015 8:59:14 AM By: Ardath Sax MD Entered By: Ardath Sax on 09/26/2015 08:59:14 Twyman, Benjamine Guzman ([id number]) -------------------------------------------------------------------------------- Wound Assessment Details Patient Name: Keith Guzman. Date of Service: 09/26/2015 8:45 AM Medical Record Number: [id number] Patient Account Number: [id number] Date of Birth/Sex: 03-13-1961 (54 y.o. Male) Treating RN: Clover Mealy, RN, BSN, [place] Sink Primary Care Physician: Farris Has Other Clinician: Referring Physician: Farris Has Treating Physician/Extender: Elayne Snare in Treatment: 1 Wound Status Wound Number: 1 Primary Diabetic Wound/Ulcer of the Lower Etiology: Extremity Wound Location: Right, Lateral Toe Fifth Wound Status: Open Wounding Event: Thermal Burn Date Acquired: 08/27/2015 Weeks Of Treatment: 1 Clustered Wound: No Photos Photo Uploaded By: Lucrezia Starch RN, [place] on 09/26/2015 15:07:29 Wound Measurements Length: (cm) 0.5 Width: (cm) 3.4 Depth: (cm) 0.1 Area: (cm) 1.335 Volume: (cm) 0.134 % Reduction in Area: 51.4% % Reduction in Volume: 51.3% Wound Description Classification: Grade 1 Periwound Skin Texture Texture Color No Abnormalities Noted: No No Abnormalities Noted: No Moisture No Abnormalities Noted: No Treatment Notes Wound #1 (Right, Lateral Toe Fifth) 1. Cleansed with: KANNAN, PAMINTUAN. ([id number]) Clean wound with Normal Saline 2. Anesthetic Topical Lidocaine 4% cream to wound bed prior to debridement 4. Dressing Applied: Santyl Ointment 5. Secondary Dressing Applied  Gauze and Kerlix/Conform 6. Footwear/Offloading device applied Other footwear/offloading device applied (specify in notes) Notes darco Electronic Signature(s) Signed: 09/26/2015 3:28:31 PM By: Elpidio Eric BSN, RN Entered By: Elpidio Eric on 09/26/2015  08:32:05 Zollinger, Benjamine Guzman ([id number]) -------------------------------------------------------------------------------- Vitals Details Patient Name: Keith Guzman. Date of Service: 09/26/2015 8:45 AM Medical Record Number: [id number] Patient Account Number: [id number] Date of Birth/Sex: July 07, 1961 (54 y.o. Male) Treating RN: Clover Mealy, RN, BSN, [place] Primary Care Physician: Farris Has Other Clinician: Referring Physician: Farris Has Treating Physician/Extender: Elayne Snare in Treatment: 1 Vital Signs Time Taken: 08:30 Temperature (F): 98.3 Height (in): 68 Pulse (bpm): 83 Weight (lbs): 171 Respiratory Rate (breaths/min): 18 Body Mass Index (BMI): 26 Blood Pressure (mmHg): 141/71 Reference Range: 80 - 120 mg / dl Electronic Signature(s) Signed: 09/26/2015 3:28:31 PM By: Elpidio Eric BSN, RN Entered By: Elpidio Eric on 09/26/2015 08:31:48
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
## 2015-10-02 ENCOUNTER — Encounter: Payer: Self-pay | Admitting: Hematology

## 2015-10-03 ENCOUNTER — Encounter: Payer: 59 | Attending: Surgery | Admitting: Surgery

## 2015-10-03 DIAGNOSIS — M869 Osteomyelitis, unspecified: Secondary | ICD-10-CM | POA: Insufficient documentation

## 2015-10-03 DIAGNOSIS — E11621 Type 2 diabetes mellitus with foot ulcer: Secondary | ICD-10-CM | POA: Diagnosis not present

## 2015-10-03 DIAGNOSIS — I420 Dilated cardiomyopathy: Secondary | ICD-10-CM | POA: Diagnosis not present

## 2015-10-03 DIAGNOSIS — Z87891 Personal history of nicotine dependence: Secondary | ICD-10-CM | POA: Insufficient documentation

## 2015-10-03 DIAGNOSIS — L97512 Non-pressure chronic ulcer of other part of right foot with fat layer exposed: Secondary | ICD-10-CM | POA: Diagnosis not present

## 2015-10-03 DIAGNOSIS — I10 Essential (primary) hypertension: Secondary | ICD-10-CM | POA: Insufficient documentation

## 2015-10-03 DIAGNOSIS — E1140 Type 2 diabetes mellitus with diabetic neuropathy, unspecified: Secondary | ICD-10-CM | POA: Diagnosis not present

## 2015-10-03 DIAGNOSIS — T25321A Burn of third degree of right foot, initial encounter: Secondary | ICD-10-CM | POA: Diagnosis not present

## 2015-10-04 NOTE — Progress Notes (Signed)
Keith Guzman ([id number]) Visit Report for 10/03/2015 Arrival Information Details Patient Name: Keith Guzman, Keith Guzman. Date of Service: 10/03/2015 8:45 AM Medical Record Number: [id number] Patient Account Number: [id number] Date of Birth/Sex: 08/15/1961 (55 y.o. Male) Treating RN: Curtis Sites Primary Care Physician: Farris Has Other Clinician: Referring Physician: Farris Has Treating Physician/Extender: Rudene Re in Treatment: 2 Visit Information History Since Last Visit Added or deleted any medications: No Patient Arrived: Ambulatory Any new allergies or adverse reactions: No Arrival Time: 09:19 Had a fall or experienced change in No Accompanied By: spouse activities of daily living that may affect Transfer Assistance: None risk of falls: Patient Identification Verified: Yes Signs or symptoms of abuse/neglect since last No Secondary Verification Process Yes visito Completed: Hospitalized since last visit: No Patient Requires Transmission-Based No Pain Present Now: No Precautions: Patient Has Alerts: Yes Patient Alerts: DM II Electronic Signature(s) Signed: 10/03/2015 5:56:46 PM By: Curtis Sites Entered By: Curtis Sites on 10/03/2015 09:19:47 Rund, [name] ([id number]) -------------------------------------------------------------------------------- Encounter Discharge Information Details Patient Name: Keith Mast. Date of Service: 10/03/2015 8:45 AM Medical Record Number: [id number] Patient Account Number: [id number] Date of Birth/Sex: 07-13-1961 (55 y.o. Male) Treating RN: Curtis Sites Primary Care Physician: Farris Has Other Clinician: Referring Physician: Farris Has Treating Physician/Extender: Rudene Re in Treatment: 2 Encounter Discharge Information Items Discharge Pain Level: 0 Discharge Condition: Stable Ambulatory Status: Ambulatory Discharge Destination: Home Transportation: Private Auto Accompanied By: spouse Schedule Follow-up  Appointment: Yes Medication Reconciliation completed and provided to Patient/Care No Ardit Danh: Provided on Clinical Summary of Care: 10/03/2015 Form Type Recipient Paper Patient LL Electronic Signature(s) Signed: 10/03/2015 9:52:48 AM By: Gwenlyn Perking Entered By: Gwenlyn Perking on 10/03/2015 09:52:48 Masri, [name] ([id number]) -------------------------------------------------------------------------------- Lower Extremity Assessment Details Patient Name: Keith Mast. Date of Service: 10/03/2015 8:45 AM Medical Record Number: [id number] Patient Account Number: [id number] Date of Birth/Sex: [birth year] (55 y.o. Male) Treating RN: Curtis Sites Primary Care Physician: Farris Has Other Clinician: Referring Physician: Farris Has Treating Physician/Extender: Rudene Re in Treatment: 2 Vascular Assessment Pulses: Posterior Tibial Dorsalis Pedis Palpable: [Right:No] Doppler: [Right:Monophasic] Extremity colors, hair growth, and conditions: Extremity Color: [Right:Normal] Hair Growth on Extremity: [Right:No] Temperature of Extremity: [Right:Warm] Capillary Refill: [Right:< 3 seconds] Electronic Signature(s) Signed: 10/03/2015 5:56:46 PM By: Curtis Sites Entered By: Curtis Sites on 10/03/2015 09:29:22 Holte, [name] ([id number]) -------------------------------------------------------------------------------- Multi Wound Chart Details Patient Name: Keith Mast. Date of Service: 10/03/2015 8:45 AM Medical Record Number: [id number] Patient Account Number: [id number] Date of Birth/Sex: May 06, 1961 (55 y.o. Male) Treating RN: Curtis Sites Primary Care Physician: Farris Has Other Clinician: Referring Physician: Farris Has Treating Physician/Extender: Rudene Re in Treatment: 2 Vital Signs Height(in): 68 Pulse(bpm): 78 Weight(lbs): 171 Blood Pressure 123/69 (mmHg): Body Mass Index(BMI): 26 Temperature(F): 98.1 Respiratory  Rate 16 (breaths/min): Photos: [1:No Photos] [N/A:N/A] Wound Location: [1:Right Toe Fifth - Lateral] [N/A:N/A] Wounding Event: [1:Thermal Burn] [N/A:N/A] Primary Etiology: [1:Diabetic Wound/Ulcer of the Lower Extremity] [N/A:N/A] Comorbid History: [1:Hypertension, Type II Diabetes, Neuropathy] [N/A:N/A] Date Acquired: [1:08/27/2015] [N/A:N/A] Weeks of Treatment: [1:2] [N/A:N/A] Wound Status: [1:Open] [N/A:N/A] Measurements L x W x D 0.4x2.9x0.1 [N/A:N/A] (cm) Area (cm) : [1:0.911] [N/A:N/A] Volume (cm) : [1:0.091] [N/A:N/A] % Reduction in Area: [1:66.90%] [N/A:N/A] % Reduction in Volume: 66.90% [N/A:N/A] Classification: [1:Grade 1] [N/A:N/A] Exudate Amount: [1:Small] [N/A:N/A] Exudate Type: [1:Serous] [N/A:N/A] Exudate Color: [1:amber] [N/A:N/A] Wound Margin: [1:Flat and Intact] [N/A:N/A] Granulation Amount: [1:Small (1-33%)] [N/A:N/A] Granulation Quality: [1:Pink] [N/A:N/A] Necrotic Amount: [1:Large (67-100%)] [N/A:N/A] Necrotic Tissue: [1:Eschar, Adherent Slough] [N/A:N/A] Exposed Structures: [  1:Fascia: No Fat: No Tendon: No Muscle: No Joint: No] [N/A:N/A] Bone: No Limited to Skin Breakdown Epithelialization: None N/A N/A Periwound Skin Texture: Callus: Yes N/A N/A Edema: No Excoriation: No Induration: No Crepitus: No Fluctuance: No Friable: No Rash: No Scarring: No Periwound Skin Moist: Yes N/A N/A Moisture: Maceration: No Dry/Scaly: No Periwound Skin Color: Atrophie Blanche: No N/A N/A Cyanosis: No Ecchymosis: No Erythema: No Hemosiderin Staining: No Mottled: No Pallor: No Rubor: No Tenderness on No N/A N/A Palpation: Wound Preparation: Ulcer Cleansing: N/A N/A Rinsed/Irrigated with Saline Topical Anesthetic Applied: Other: lidocaine 4% Treatment Notes Electronic Signature(s) Signed: 10/03/2015 5:56:46 PM By: Curtis Sites Entered By: Curtis Sites on 10/03/2015 09:29:33 Krausz, [name]  ([id number]) -------------------------------------------------------------------------------- Multi-Disciplinary Care Plan Details Patient Name: Keith Mast. Date of Service: 10/03/2015 8:45 AM Medical Record Number: [id number] Patient Account Number: [id number] Date of Birth/Sex: 1961/09/14 (55 y.o. Male) Treating RN: Curtis Sites Primary Care Physician: Farris Has Other Clinician: Referring Physician: Farris Has Treating Physician/Extender: Rudene Re in Treatment: 2 Active Inactive Orientation to the Wound Care Program Nursing Diagnoses: Knowledge deficit related to the wound healing center program Goals: Patient/caregiver will verbalize understanding of the Wound Healing Center Program Date Initiated: 09/13/2015 Goal Status: Active Interventions: Provide education on orientation to the wound center Notes: Wound/Skin Impairment Nursing Diagnoses: Impaired tissue integrity Knowledge deficit related to ulceration/compromised skin integrity Goals: Patient/caregiver will verbalize understanding of skin care regimen Date Initiated: 09/13/2015 Goal Status: Active Ulcer/skin breakdown will have a volume reduction of 30% by week 4 Date Initiated: 09/13/2015 Goal Status: Active Ulcer/skin breakdown will have a volume reduction of 50% by week 8 Date Initiated: 09/13/2015 Goal Status: Active Ulcer/skin breakdown will have a volume reduction of 80% by week 12 Date Initiated: 09/13/2015 Goal Status: Active Ulcer/skin breakdown will heal within 14 weeks Date Initiated: 09/13/2015 KAMORI, BARBIER ([id number]) Goal Status: Active Interventions: Assess patient/caregiver ability to obtain necessary supplies Assess patient/caregiver ability to perform ulcer/skin care regimen upon admission and as needed Assess ulceration(s) every visit Provide education on ulcer and skin care Treatment Activities: Referred to DME Erhard Senske for dressing supplies : 10/03/2015 Skin care  regimen initiated : 10/03/2015 Topical wound management initiated : 10/03/2015 Notes: Electronic Signature(s) Signed: 10/03/2015 5:56:46 PM By: Curtis Sites Entered By: Curtis Sites on 10/03/2015 08:65:78 Keith Mast ([id number]) -------------------------------------------------------------------------------- Patient/Caregiver Education Details Patient Name: Keith Mast. Date of Service: 10/03/2015 8:45 AM Medical Record Number: [id number] Patient Account Number: [id number] Date of Birth/Gender: [birth year] (55 y.o. Male) Treating RN: Curtis Sites Primary Care Physician: Farris Has Other Clinician: Referring Physician: Farris Has Treating Physician/Extender: Rudene Re in Treatment: 2 Education Assessment Education Provided To: Patient and Caregiver Education Topics Provided Wound/Skin Impairment: Handouts: Other: continue wound care as ordered Methods: Demonstration, Explain/Verbal Responses: State content correctly Electronic Signature(s) Signed: 10/03/2015 5:56:46 PM By: Curtis Sites Entered By: Curtis Sites on 10/03/2015 09:51:41 Michelini, [name] ([id number]) -------------------------------------------------------------------------------- Wound Assessment Details Patient Name: Keith Mast. Date of Service: 10/03/2015 8:45 AM Medical Record Number: [id number] Patient Account Number: [id number] Date of Birth/Sex: 1961-07-07 (55 y.o. Male) Treating RN: Curtis Sites Primary Care Physician: Farris Has Other Clinician: Referring Physician: Farris Has Treating Physician/Extender: Rudene Re in Treatment: 2 Wound Status Wound Number: 1 Primary Diabetic Wound/Ulcer of the Lower Etiology: Extremity Wound Location: Right Toe Fifth - Lateral Wound Status: Open Wounding Event: Thermal Burn Comorbid Hypertension, Type II Diabetes, Date Acquired: 08/27/2015 History: Neuropathy Weeks Of Treatment: 2 Clustered Wound: No Photos Photo Uploaded By:  Curtis Sites on 10/03/2015 15:37:28 Wound Measurements Length: (cm) 0.4 Width: (cm) 2.9 Depth: (cm) 0.1 Area: (cm) 0.911 Volume: (cm) 0.091 % Reduction in Area: 66.9% % Reduction in Volume: 66.9% Epithelialization: None Tunneling: No Undermining: No Wound Description Classification: Grade 1 Wound Margin: Flat and Intact Exudate Amount: Small Exudate Type: Serous Exudate Color: amber Foul Odor After Cleansing: No Wound Bed Granulation Amount: Small (1-33%) Exposed Structure Granulation Quality: Pink Fascia Exposed: No Necrotic Amount: Large (67-100%) Fat Layer Exposed: No Necrotic Quality: Eschar, Adherent Slough Tendon Exposed: No Mccue, Laden M. ([id number]) Muscle Exposed: No Joint Exposed: No Bone Exposed: No Limited to Skin Breakdown Periwound Skin Texture Texture Color No Abnormalities Noted: No No Abnormalities Noted: No Callus: Yes Atrophie Blanche: No Crepitus: No Cyanosis: No Excoriation: No Ecchymosis: No Fluctuance: No Erythema: No Friable: No Hemosiderin Staining: No Induration: No Mottled: No Localized Edema: No Pallor: No Rash: No Rubor: No Scarring: No Moisture No Abnormalities Noted: No Dry / Scaly: No Maceration: No Moist: Yes Wound Preparation Ulcer Cleansing: Rinsed/Irrigated with Saline Topical Anesthetic Applied: Other: lidocaine 4%, Treatment Notes Wound #1 (Right, Lateral Toe Fifth) 1. Cleansed with: Clean wound with Normal Saline 2. Anesthetic Topical Lidocaine 4% cream to wound bed prior to debridement 4. Dressing Applied: Santyl Ointment 5. Secondary Dressing Applied Gauze and Kerlix/Conform 7. Secured with Tape Notes Optician, dispensing) Signed: 10/03/2015 5:56:46 PM By: Curtis Sites Entered By: Curtis Sites on 10/03/2015 09:28:33 JORAN, KALLAL ([name] ([id number]) -------------------------------------------------------------------------------- Vitals Details Patient Name: Keith Mast. Date of Service: 10/03/2015 8:45 AM Medical Record Number: [id number] Patient Account Number: [id number] Date of Birth/Sex: 04/18/1961 (55 y.o. Male) Treating RN: Curtis Sites Primary Care Physician: Farris Has Other Clinician: Referring Physician: Farris Has Treating Physician/Extender: Rudene Re in Treatment: 2 Vital Signs Time Taken: 09:21 Temperature (F): 98.1 Height (in): 68 Pulse (bpm): 78 Weight (lbs): 171 Respiratory Rate (breaths/min): 16 Body Mass Index (BMI): 26 Blood Pressure (mmHg): 123/69 Reference Range: 80 - 120 mg / dl Electronic Signature(s) Signed: 10/03/2015 5:56:46 PM By: Curtis Sites Entered By: Curtis Sites on 10/03/2015 40:34:74
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
## 2015-10-10 ENCOUNTER — Encounter: Payer: 59 | Admitting: Surgery

## 2015-10-10 DIAGNOSIS — E1140 Type 2 diabetes mellitus with diabetic neuropathy, unspecified: Secondary | ICD-10-CM | POA: Diagnosis not present

## 2015-10-10 DIAGNOSIS — L97512 Non-pressure chronic ulcer of other part of right foot with fat layer exposed: Secondary | ICD-10-CM | POA: Diagnosis not present

## 2015-10-10 DIAGNOSIS — E11621 Type 2 diabetes mellitus with foot ulcer: Secondary | ICD-10-CM | POA: Diagnosis not present

## 2015-10-10 DIAGNOSIS — I10 Essential (primary) hypertension: Secondary | ICD-10-CM | POA: Diagnosis not present

## 2015-10-10 DIAGNOSIS — T25321A Burn of third degree of right foot, initial encounter: Secondary | ICD-10-CM | POA: Diagnosis not present

## 2015-10-10 DIAGNOSIS — Z87891 Personal history of nicotine dependence: Secondary | ICD-10-CM | POA: Diagnosis not present

## 2015-10-11 DIAGNOSIS — N183 Chronic kidney disease, stage 3 (moderate): Secondary | ICD-10-CM | POA: Diagnosis not present

## 2015-10-11 DIAGNOSIS — T300 Burn of unspecified body region, unspecified degree: Secondary | ICD-10-CM | POA: Diagnosis not present

## 2015-10-11 DIAGNOSIS — E1122 Type 2 diabetes mellitus with diabetic chronic kidney disease: Secondary | ICD-10-CM | POA: Diagnosis not present

## 2015-10-11 DIAGNOSIS — M25511 Pain in right shoulder: Secondary | ICD-10-CM | POA: Diagnosis not present
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
## 2015-10-11 NOTE — Progress Notes (Signed)
NEWLAND, SANTELL ([id number]) Visit Report for 10/10/2015 Arrival Information Details Patient Name: [place], [place]. Date of Service: 10/10/2015 8:45 AM Medical Record Number: [id number] Patient Account Number: [id number] Date of Birth/Sex: 01-24-1961 (55 y.o. Male) Treating RN: Curtis Sites Primary Care Physician: Farris Has Other Clinician: Referring Physician: Farris Has Treating Physician/Extender: Rudene Re in Treatment: 3 Visit Information History Since Last Visit Added or deleted any medications: No Patient Arrived: Ambulatory Any new allergies or adverse reactions: No Arrival Time: 08:55 Had a fall or experienced change in No Accompanied By: spouse activities of daily living that may affect Transfer Assistance: None risk of falls: Patient Identification Verified: Yes Signs or symptoms of abuse/neglect since last No Secondary Verification Process Yes visito Completed: Hospitalized since last visit: No Patient Requires Transmission-Based No Pain Present Now: No Precautions: Patient Has Alerts: Yes Patient Alerts: DM II Electronic Signature(s) Signed: 10/10/2015 5:05:29 PM By: Curtis Sites Entered By: Curtis Sites on 10/10/2015 08:57:28 Sayavong, [name] ([id number]) -------------------------------------------------------------------------------- Encounter Discharge Information Details Patient Name: [name] Date of Service: 10/10/2015 8:45 AM Medical Record Number: [id number] Patient Account Number: [id number] Date of Birth/Sex: 05-11-1961 (55 y.o. Male) Treating RN: Curtis Sites Primary Care Physician: Farris Has Other Clinician: Referring Physician: Farris Has Treating Physician/Extender: Rudene Re in Treatment: 3 Encounter Discharge Information Items Discharge Pain Level: 0 Discharge Condition: Stable Ambulatory Status: Ambulatory Discharge Destination: Home Transportation: Private Auto Accompanied By: spouse Schedule  Follow-up Appointment: Yes Medication Reconciliation completed and provided to Patient/Care No Raynie Steinhaus: Provided on Clinical Summary of Care: 10/10/2015 Form Type Recipient Paper Patient LL Electronic Signature(s) Signed: 10/10/2015 5:05:29 PM By: Curtis Sites Previous Signature: 10/10/2015 9:32:14 AM Version By: Gwenlyn Perking Entered By: Curtis Sites on 10/10/2015 09:32:37 Orr, [name] ([id number]) -------------------------------------------------------------------------------- Lower Extremity Assessment Details Patient Name: [name] Date of Service: 10/10/2015 8:45 AM Medical Record Number: [id number] Patient Account Number: [id number] Date of Birth/Sex: 1960-11-11 (55 y.o. Male) Treating RN: Curtis Sites Primary Care Physician: Farris Has Other Clinician: Referring Physician: Farris Has Treating Physician/Extender: Rudene Re in Treatment: 3 Vascular Assessment Pulses: Posterior Tibial Dorsalis Pedis Palpable: [Right:Yes] Extremity colors, hair growth, and conditions: Extremity Color: [Right:Normal] Hair Growth on Extremity: [Right:No] Temperature of Extremity: [Right:Warm] Capillary Refill: [Right:< 3 seconds] Toe Nail Assessment Left: Right: Thick: No Discolored: No Deformed: No Improper Length and Hygiene: No Electronic Signature(s) Signed: 10/10/2015 5:05:29 PM By: Curtis Sites Entered By: Curtis Sites on 10/10/2015 09:03:18 Sweda, [name] ([id number]) -------------------------------------------------------------------------------- Multi Wound Chart Details Patient Name: [name] Date of Service: 10/10/2015 8:45 AM Medical Record Number: [id number] Patient Account Number: [id number] Date of Birth/Sex: 1961/05/03 (55 y.o. Male) Treating RN: Clover Mealy, RN, BSN,  Sink Primary Care Physician: Farris Has Other Clinician: Referring Physician: Farris Has Treating Physician/Extender: Rudene Re in Treatment: 3 Vital  Signs Height(in): 68 Pulse(bpm): 81 Weight(lbs): 171 Blood Pressure 133/83 (mmHg): Body Mass Index(BMI): 26 Temperature(F): 98.3 Respiratory Rate 16 (breaths/min): Photos: [1:No Photos] [N/A:N/A] Wound Location: [1:Right Toe Fifth - Lateral] [N/A:N/A] Wounding Event: [1:Thermal Burn] [N/A:N/A] Primary Etiology: [1:Diabetic Wound/Ulcer of the Lower Extremity] [N/A:N/A] Comorbid History: [1:Hypertension, Type II Diabetes, Neuropathy] [N/A:N/A] Date Acquired: [1:08/27/2015] [N/A:N/A] Weeks of Treatment: [1:3] [N/A:N/A] Wound Status: [1:Open] [N/A:N/A] Measurements L x W x D 0.4x1.8x0.1 [N/A:N/A] (cm) Area (cm) : [1:0.565] [N/A:N/A] Volume (cm) : [1:0.057] [N/A:N/A] % Reduction in Area: [1:79.40%] [N/A:N/A] % Reduction in Volume: 79.30% [N/A:N/A] Classification: [1:Grade 1] [N/A:N/A] Exudate Amount: [1:Small] [N/A:N/A] Exudate Type: [1:Serous] [N/A:N/A] Exudate Color: [1:amber] [N/A:N/A] Wound Margin: [1:Flat  and Intact] [N/A:N/A] Granulation Amount: [1:Small (1-33%)] [N/A:N/A] Granulation Quality: [1:Pink] [N/A:N/A] Necrotic Amount: [1:Large (67-100%)] [N/A:N/A] Necrotic Tissue: [1:Eschar, Adherent Slough] [N/A:N/A] Exposed Structures: [1:Fascia: No Fat: No Tendon: No Muscle: No Joint: No] [N/A:N/A] Bone: No Limited to Skin Breakdown Epithelialization: None N/A N/A Periwound Skin Texture: Callus: Yes N/A N/A Edema: No Excoriation: No Induration: No Crepitus: No Fluctuance: No Friable: No Rash: No Scarring: No Periwound Skin Moist: Yes N/A N/A Moisture: Maceration: No Dry/Scaly: No Periwound Skin Color: Atrophie Blanche: No N/A N/A Cyanosis: No Ecchymosis: No Erythema: No Hemosiderin Staining: No Mottled: No Pallor: No Rubor: No Tenderness on No N/A N/A Palpation: Wound Preparation: Ulcer Cleansing: N/A N/A Rinsed/Irrigated with Saline Topical Anesthetic Applied: Other: lidocaine 4% Treatment Notes Electronic Signature(s) Signed: 10/10/2015  5:20:37 PM By: Elpidio Eric BSN, RN Entered By: Elpidio Eric on 10/10/2015 09:17:53 Steil, [name] ([id number]) -------------------------------------------------------------------------------- Multi-Disciplinary Care Plan Details Patient Name: [name] Date of Service: 10/10/2015 8:45 AM Medical Record Number: [id number] Patient Account Number: [id number] Date of Birth/Sex: [birth year] (55 y.o. Male) Treating RN: Clover Mealy, RN, BSN,  Sink Primary Care Physician: Farris Has Other Clinician: Referring Physician: Farris Has Treating Physician/Extender: Rudene Re in Treatment: 3 Active Inactive Orientation to the Wound Care Program Nursing Diagnoses: Knowledge deficit related to the wound healing center program Goals: Patient/caregiver will verbalize understanding of the Wound Healing Center Program Date Initiated: 09/13/2015 Goal Status: Active Interventions: Provide education on orientation to the wound center Notes: Wound/Skin Impairment Nursing Diagnoses: Impaired tissue integrity Knowledge deficit related to ulceration/compromised skin integrity Goals: Patient/caregiver will verbalize understanding of skin care regimen Date Initiated: 09/13/2015 Goal Status: Active Ulcer/skin breakdown will have a volume reduction of 30% by week 4 Date Initiated: 09/13/2015 Goal Status: Active Ulcer/skin breakdown will have a volume reduction of 50% by week 8 Date Initiated: 09/13/2015 Goal Status: Active Ulcer/skin breakdown will have a volume reduction of 80% by week 12 Date Initiated: 09/13/2015 Goal Status: Active Ulcer/skin breakdown will heal within 14 weeks Date Initiated: 09/13/2015 CLYDE, ZARRELLA ([id number]) Goal Status: Active Interventions: Assess patient/caregiver ability to obtain necessary supplies Assess patient/caregiver ability to perform ulcer/skin care regimen upon admission and as needed Assess ulceration(s) every visit Provide education on ulcer and skin  care Treatment Activities: Referred to DME Reynalda Canny for dressing supplies : 10/10/2015 Skin care regimen initiated : 10/10/2015 Topical wound management initiated : 10/10/2015 Notes: Electronic Signature(s) Signed: 10/10/2015 5:20:37 PM By: Elpidio Eric BSN, RN Entered By: Elpidio Eric on 10/10/2015 09:17:46 Wanat, [name] ([id number]) -------------------------------------------------------------------------------- Patient/Caregiver Education Details Patient Name: [name] Date of Service: 10/10/2015 8:45 AM Medical Record Number: [id number] Patient Account Number: [id number] Date of Birth/Gender: 07/31/1961 (55 y.o. Male) Treating RN: Curtis Sites Primary Care Physician: Farris Has Other Clinician: Referring Physician: Farris Has Treating Physician/Extender: Rudene Re in Treatment: 3 Education Assessment Education Provided To: Patient and Caregiver Education Topics Provided Wound/Skin Impairment: Handouts: Other: wound care as ordered Methods: Demonstration, Explain/Verbal Responses: State content correctly Electronic Signature(s) Signed: 10/10/2015 5:05:29 PM By: Curtis Sites Entered By: Curtis Sites on 10/10/2015 09:33:00 Sakamoto, [name] ([id number]) -------------------------------------------------------------------------------- Wound Assessment Details Patient Name: [name] Date of Service: 10/10/2015 8:45 AM Medical Record Number: [id number] Patient Account Number: [id number] Date of Birth/Sex: 1961/09/17 (55 y.o. Male) Treating RN: Curtis Sites Primary Care Physician: Farris Has Other Clinician: Referring Physician: Farris Has Treating Physician/Extender: Rudene Re in Treatment: 3 Wound Status Wound Number: 1 Primary Diabetic Wound/Ulcer of the Lower Etiology: Extremity Wound Location: Right Toe Fifth -  Lateral Wound Status: Open Wounding Event: Thermal Burn Comorbid Hypertension, Type II Diabetes, Date Acquired:  08/27/2015 History: Neuropathy Weeks Of Treatment: 3 Clustered Wound: No Photos Photo Uploaded By: Curtis Sites on 10/10/2015 11:27:17 Wound Measurements Length: (cm) 0.4 Width: (cm) 1.8 Depth: (cm) 0.1 Area: (cm) 0.565 Volume: (cm) 0.057 % Reduction in Area: 79.4% % Reduction in Volume: 79.3% Epithelialization: None Tunneling: No Undermining: No Wound Description Classification: Grade 1 Wound Margin: Flat and Intact Exudate Amount: Small Exudate Type: Serous Exudate Color: amber Foul Odor After Cleansing: No Wound Bed Granulation Amount: Small (1-33%) Exposed Structure Granulation Quality: Pink Fascia Exposed: No Necrotic Amount: Large (67-100%) Fat Layer Exposed: No Necrotic Quality: Eschar, Adherent Slough Tendon Exposed: No Paull, Sunny M. ([id number]) Muscle Exposed: No Joint Exposed: No Bone Exposed: No Limited to Skin Breakdown Periwound Skin Texture Texture Color No Abnormalities Noted: No No Abnormalities Noted: No Callus: Yes Atrophie Blanche: No Crepitus: No Cyanosis: No Excoriation: No Ecchymosis: No Fluctuance: No Erythema: No Friable: No Hemosiderin Staining: No Induration: No Mottled: No Localized Edema: No Pallor: No Rash: No Rubor: No Scarring: No Moisture No Abnormalities Noted: No Dry / Scaly: No Maceration: No Moist: Yes Wound Preparation Ulcer Cleansing: Rinsed/Irrigated with Saline Topical Anesthetic Applied: Other: lidocaine 4%, Treatment Notes Wound #1 (Right, Lateral Toe Fifth) 1. Cleansed with: Clean wound with Normal Saline 2. Anesthetic Topical Lidocaine 4% cream to wound bed prior to debridement 4. Dressing Applied: Medihoney Gel 5. Secondary Dressing Applied Gauze and Kerlix/Conform 7. Secured with Tape Notes darco Electronic Signature(s) Signed: 10/10/2015 5:05:29 PM By: Curtis Sites Entered By: Curtis Sites on 10/10/2015 09:02:50 Hillmann, KELDRIC POYER ([id number]) JARREAU, CALLANAN  ([id number]) -------------------------------------------------------------------------------- Vitals Details Patient Name: [name] Date of Service: 10/10/2015 8:45 AM Medical Record Number: [id number] Patient Account Number: [id number] Date of Birth/Sex: Dec 22, 1960 (55 y.o. Male) Treating RN: Curtis Sites Primary Care Physician: Farris Has Other Clinician: Referring Physician: Farris Has Treating Physician/Extender: Rudene Re in Treatment: 3 Vital Signs Time Taken: 08:57 Temperature (F): 98.3 Height (in): 68 Pulse (bpm): 81 Weight (lbs): 171 Respiratory Rate (breaths/min): 16 Body Mass Index (BMI): 26 Blood Pressure (mmHg): 133/83 Reference Range: 80 - 120 mg / dl Electronic Signature(s) Signed: 10/10/2015 5:05:29 PM By: Curtis Sites Entered By: Curtis Sites on 10/10/2015 08:57:52

## 2015-10-17 ENCOUNTER — Encounter: Payer: 59 | Attending: Internal Medicine | Admitting: *Deleted

## 2015-10-17 ENCOUNTER — Ambulatory Visit: Payer: 59 | Admitting: Surgery

## 2015-10-17 ENCOUNTER — Encounter: Payer: Self-pay | Admitting: *Deleted

## 2015-10-17 VITALS — Ht 71.5 in | Wt 169.4 lb

## 2015-10-17 DIAGNOSIS — Z713 Dietary counseling and surveillance: Secondary | ICD-10-CM | POA: Diagnosis not present

## 2015-10-17 DIAGNOSIS — Z794 Long term (current) use of insulin: Secondary | ICD-10-CM | POA: Diagnosis not present

## 2015-10-17 DIAGNOSIS — IMO0002 Reserved for concepts with insufficient information to code with codable children: Secondary | ICD-10-CM

## 2015-10-17 DIAGNOSIS — E1165 Type 2 diabetes mellitus with hyperglycemia: Secondary | ICD-10-CM | POA: Diagnosis not present

## 2015-10-17 DIAGNOSIS — E118 Type 2 diabetes mellitus with unspecified complications: Secondary | ICD-10-CM

## 2015-10-17 NOTE — Patient Instructions (Signed)
Plan:  Aim for 3 Carb Choices per meal (45 grams) +/- 1 either way  Aim for 0-2 Carbs per snack if hungry  Include protein in moderation with your meals and snacks Consider  increasing your activity level by doing some Arm Chair Exercises daily as tolerated Continue checking BG at alternate times per day  Continue taking medication as directed by MD

## 2015-10-17 NOTE — Progress Notes (Signed)
Diabetes Self-Management Education  Visit Type: First/Initial  Appt. Start Time: 0800 Appt. End Time: 0930  10/17/2015  Mr. Keith Guzman, identified by name and date of birth, is a 55 y.o. male with a diagnosis of Diabetes: Type 2.   ASSESSMENT  Height 5' 11.5" (1.816 m), weight 169 lb 6.4 oz (76.839 kg). Body mass index is 23.3 kg/(m^2).      Diabetes Self-Management Education - 10/17/15 0802    Visit Information   Visit Type First/Initial   Initial Visit   Diabetes Type Type 2   Are you currently following a meal plan? No   Are you taking your medications as prescribed? Yes   Date Diagnosed 2004   Health Coping   How would you rate your overall health? Fair   Psychosocial Assessment   Patient Belief/Attitude about Diabetes Other (comment)  no opinion stated   Self-care barriers Impaired vision;Other (comment)  multiple complications from diabetes including retinopathy with loss of vision. nephropathy, amputaion of 1/2 of right foot   Self-management support Family;Doctor's office   Other persons present Patient;Spouse/SO  wife assisted with vision issues, appears supportive during this visit   Patient Concerns Nutrition/Meal planning   Special Needs Simplified materials;Verbal instruction;Instruct caregiver   Preferred Learning Style Visual;Hands on   Learning Readiness Contemplating   How often do you need to have someone help you when you read instructions, pamphlets, or other written materials from your doctor or pharmacy? 4 - Often   Complications   Last HgB A1C per patient/outside source --  not provided, patient not aware   How often do you check your blood sugar? 1-2 times/day   Fasting Blood glucose range (mg/dL) 161-096;04-540;981-191  states BG improving since last MD visit with Dr. Sharl Ma   Number of hypoglycemic episodes per month 0   Have you had a dilated eye exam in the past 12 months? Yes   Have you had a dental exam in the past 12 months? No   Are you  checking your feet? Yes   How many days per week are you checking your feet? 1   Dietary Intake   Breakfast 2 boiled eggs, [place] sausage, occasionally bread but not usually   Snack (morning) potato chips, popcorn, fresh fruit   Lunch 3 PM: fresh fruit, left overs   Snack (afternoon) same as AM   Dinner meat, starch, vegetables OR eat out: [place], infrequently burger and fries   Beverage(s) Hawaiian punch, regular soda, water   Exercise   Exercise Type ADL's   How many days per week to you exercise? 0   How many minutes per day do you exercise? 0   Total minutes per week of exercise 0   Patient Education   Previous Diabetes Education No   Disease state  Definition of diabetes, type 1 and 2, and the diagnosis of diabetes   Nutrition management  Role of diet in the treatment of diabetes and the relationship between the three main macronutrients and blood glucose level;Carbohydrate counting   Physical activity and exercise  Helped patient identify appropriate exercises in relation to his/her diabetes, diabetes complications and other health issue.   Medications Reviewed patients medication for diabetes, action, purpose, timing of dose and side effects.   Monitoring Identified appropriate SMBG and/or A1C goals.   Chronic complications Relationship between chronic complications and blood glucose control   Personal strategies to promote health Helped patient develop diabetes management plan for (enter comment)   Individualized Goals (developed by patient)  Nutrition Follow meal plan discussed   Physical Activity Exercise 3-5 times per week  Consider Arm Chair Exercises   Medications take my medication as prescribed   Monitoring  test blood glucose pre and post meals as discussed   Outcomes   Expected Outcomes Demonstrated interest in learning. Expect positive outcomes   Future DMSE PRN  Patient took my card and will call when ready to come back for further education   Program Status Not  Completed      Individualized Plan for Diabetes Self-Management Training:   Learning Objective:  Patient will have a greater understanding of diabetes self-management. Patient education plan is to attend individual and/or group sessions per assessed needs and concerns.   Plan:   Patient Instructions  Plan:  Aim for 3 Carb Choices per meal (45 grams) +/- 1 either way  Aim for 0-2 Carbs per snack if hungry  Include protein in moderation with your meals and snacks Consider  increasing your activity level by doing some Arm Chair Exercises daily as tolerated Continue checking BG at alternate times per day  Continue taking medication as directed by MD    Expected Outcomes:  Demonstrated interest in learning. Expect positive outcomes  Education material provided: Living Well with Diabetes, Meal plan card and Carbohydrate counting sheet  If problems or questions, patient to contact team via:  Phone and Email  Future DSME appointment: PRN (Patient took my card and will call when ready to come back for further education)

## 2015-10-18 ENCOUNTER — Encounter: Payer: 59 | Admitting: Surgery

## 2015-10-18 DIAGNOSIS — I10 Essential (primary) hypertension: Secondary | ICD-10-CM | POA: Diagnosis not present

## 2015-10-18 DIAGNOSIS — E11621 Type 2 diabetes mellitus with foot ulcer: Secondary | ICD-10-CM | POA: Diagnosis not present

## 2015-10-18 DIAGNOSIS — E1140 Type 2 diabetes mellitus with diabetic neuropathy, unspecified: Secondary | ICD-10-CM | POA: Diagnosis not present

## 2015-10-18 DIAGNOSIS — L97512 Non-pressure chronic ulcer of other part of right foot with fat layer exposed: Secondary | ICD-10-CM | POA: Diagnosis not present

## 2015-10-18 DIAGNOSIS — Z87891 Personal history of nicotine dependence: Secondary | ICD-10-CM | POA: Diagnosis not present

## 2015-10-18 DIAGNOSIS — T25321A Burn of third degree of right foot, initial encounter: Secondary | ICD-10-CM | POA: Diagnosis not present
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
## 2015-10-21 ENCOUNTER — Other Ambulatory Visit: Payer: Self-pay | Admitting: *Deleted

## 2015-10-21 DIAGNOSIS — D638 Anemia in other chronic diseases classified elsewhere: Secondary | ICD-10-CM

## 2015-10-22 ENCOUNTER — Ambulatory Visit (HOSPITAL_BASED_OUTPATIENT_CLINIC_OR_DEPARTMENT_OTHER): Payer: 59

## 2015-10-22 ENCOUNTER — Other Ambulatory Visit (HOSPITAL_BASED_OUTPATIENT_CLINIC_OR_DEPARTMENT_OTHER): Payer: 59

## 2015-10-22 VITALS — BP 153/83 | HR 79 | Temp 98.2°F

## 2015-10-22 DIAGNOSIS — D638 Anemia in other chronic diseases classified elsewhere: Secondary | ICD-10-CM | POA: Diagnosis not present

## 2015-10-22 DIAGNOSIS — N189 Chronic kidney disease, unspecified: Secondary | ICD-10-CM | POA: Diagnosis not present

## 2015-10-22 DIAGNOSIS — D631 Anemia in chronic kidney disease: Secondary | ICD-10-CM

## 2015-10-22 LAB — CBC WITH DIFFERENTIAL/PLATELET
BASO%: 0.3 % (ref 0.0–2.0)
Basophils Absolute: 0 10*3/uL (ref 0.0–0.1)
EOS ABS: 0.3 10*3/uL (ref 0.0–0.5)
EOS%: 3.9 % (ref 0.0–7.0)
HCT: 34 % — ABNORMAL LOW (ref 38.4–49.9)
HGB: 10.4 g/dL — ABNORMAL LOW (ref 13.0–17.1)
LYMPH%: 20.4 % (ref 14.0–49.0)
MCH: 23.5 pg — AB (ref 27.2–33.4)
MCHC: 30.6 g/dL — AB (ref 32.0–36.0)
MCV: 76.9 fL — AB (ref 79.3–98.0)
MONO#: 0.6 10*3/uL (ref 0.1–0.9)
MONO%: 9.2 % (ref 0.0–14.0)
NEUT#: 4.6 10*3/uL (ref 1.5–6.5)
NEUT%: 66.2 % (ref 39.0–75.0)
Platelets: 226 10*3/uL (ref 140–400)
RBC: 4.42 10*6/uL (ref 4.20–5.82)
RDW: 15.8 % — ABNORMAL HIGH (ref 11.0–14.6)
WBC: 6.9 10*3/uL (ref 4.0–10.3)
lymph#: 1.4 10*3/uL (ref 0.9–3.3)

## 2015-10-22 MED ORDER — DARBEPOETIN ALFA 150 MCG/0.3ML IJ SOSY
150.0000 ug | PREFILLED_SYRINGE | Freq: Once | INTRAMUSCULAR | Status: AC
  Administered 2015-10-22: 150 ug via SUBCUTANEOUS
  Filled 2015-10-22: qty 0.3

## 2015-10-25 ENCOUNTER — Encounter: Payer: 59 | Admitting: Surgery

## 2015-10-25 DIAGNOSIS — T25321A Burn of third degree of right foot, initial encounter: Secondary | ICD-10-CM | POA: Diagnosis not present

## 2015-10-25 DIAGNOSIS — E11621 Type 2 diabetes mellitus with foot ulcer: Secondary | ICD-10-CM | POA: Diagnosis not present

## 2015-10-25 DIAGNOSIS — L97512 Non-pressure chronic ulcer of other part of right foot with fat layer exposed: Secondary | ICD-10-CM | POA: Diagnosis not present

## 2015-10-25 DIAGNOSIS — I10 Essential (primary) hypertension: Secondary | ICD-10-CM | POA: Diagnosis not present

## 2015-10-25 DIAGNOSIS — Z87891 Personal history of nicotine dependence: Secondary | ICD-10-CM | POA: Diagnosis not present

## 2015-10-25 DIAGNOSIS — E1140 Type 2 diabetes mellitus with diabetic neuropathy, unspecified: Secondary | ICD-10-CM | POA: Diagnosis not present

## 2015-10-25 DIAGNOSIS — L97511 Non-pressure chronic ulcer of other part of right foot limited to breakdown of skin: Secondary | ICD-10-CM | POA: Diagnosis not present
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
## 2015-10-31 NOTE — Progress Notes (Signed)
[name], [name] ([id number]) Visit Report for 10/25/2015 Arrival Information Details Patient Name: Keith Guzman. Date of Service: 10/25/2015 8:45 AM Medical Record Number: [id number] Patient Account Number: [id number] Date of Birth/Sex: [birth year] (55 y.o. Male) Treating RN: Phillis Haggis Primary Care Physician: Farris Has Other Clinician: Referring Physician: Farris Has Treating Physician/Extender: Rudene Re in Treatment: 6 Visit Information History Since Last Visit All ordered tests and consults were completed: No Patient Arrived: Ambulatory Added or deleted any medications: No Arrival Time: 09:20 Any new allergies or adverse reactions: No Accompanied By: wife Had a fall or experienced change in No Transfer Assistance: None activities of daily living that may affect Patient Identification Verified: Yes risk of falls: Secondary Verification Process Yes Signs or symptoms of abuse/neglect since last No Completed: visito Patient Requires Transmission-Based No Hospitalized since last visit: No Precautions: Pain Present Now: No Patient Has Alerts: Yes Patient Alerts: DM II Electronic Signature(s) Signed: 10/30/2015 5:03:09 PM By: Alejandro Mulling Entered By: Alejandro Mulling on 10/25/2015 09:20:27 Keith Guzman ([id number]) -------------------------------------------------------------------------------- Clinic Level of Care Assessment Details Patient Name: Keith Guzman. Date of Service: 10/25/2015 8:45 AM Medical Record Number: [id number] Patient Account Number: [id number] Date of Birth/Sex: Feb 05, 1961 (55 y.o. Male) Treating RN: Phillis Haggis Primary Care Physician: Farris Has Other Clinician: Referring Physician: Farris Has Treating Physician/Extender: Rudene Re in Treatment: 6 Clinic Level of Care Assessment Items TOOL 4 Quantity Score []  - Use when only an EandM is performed on FOLLOW-UP visit 0 ASSESSMENTS - Nursing Assessment /  Reassessment []  - Reassessment of Co-morbidities (includes updates in patient status) 0 X - Reassessment of Adherence to Treatment Plan 1 5 ASSESSMENTS - Wound and Skin Assessment / Reassessment X - Simple Wound Assessment / Reassessment - one wound 1 5 []  - Complex Wound Assessment / Reassessment - multiple wounds 0 []  - Dermatologic / Skin Assessment (not related to wound area) 0 ASSESSMENTS - Focused Assessment []  - Circumferential Edema Measurements - multi extremities 0 []  - Nutritional Assessment / Counseling / Intervention 0 []  - Lower Extremity Assessment (monofilament, tuning fork, pulses) 0 []  - Peripheral Arterial Disease Assessment (using hand held doppler) 0 ASSESSMENTS - Ostomy and/or Continence Assessment and Care []  - Incontinence Assessment and Management 0 []  - Ostomy Care Assessment and Management (repouching, etc.) 0 PROCESS - Coordination of Care X - Simple Patient / Family Education for ongoing care 1 15 []  - Complex (extensive) Patient / Family Education for ongoing care 0 []  - Staff obtains Chiropractor, Records, Test Results / Process Orders 0 []  - Staff telephones HHA, Nursing Homes / Clarify orders / etc 0 []  - Routine Transfer to another Facility (non-emergent condition) 0 [name], [name] ([id number]) []  - Routine Hospital Admission (non-emergent condition) 0 []  - New Admissions / Manufacturing engineer / Ordering NPWT, Apligraf, etc. 0 []  - Emergency Hospital Admission (emergent condition) 0 []  - Simple Discharge Coordination 0 X - Complex (extensive) Discharge Coordination 1 15 PROCESS - Special Needs []  - Pediatric / Minor Patient Management 0 []  - Isolation Patient Management 0 []  - Hearing / Language / Visual special needs 0 []  - Assessment of Community assistance (transportation, D/C planning, etc.) 0 []  - Additional assistance / Altered mentation 0 []  - Support Surface(s) Assessment (bed, cushion, seat, etc.) 0 INTERVENTIONS - Wound Cleansing /  Measurement X - Simple Wound Cleansing - one wound 1 5 []  - Complex Wound Cleansing - multiple wounds 0 X - Wound Imaging (photographs - any number of wounds) 1  5 []  - Wound Tracing (instead of photographs) 0 X - Simple Wound Measurement - one wound 1 5 []  - Complex Wound Measurement - multiple wounds 0 INTERVENTIONS - Wound Dressings X - Small Wound Dressing one or multiple wounds 1 10 []  - Medium Wound Dressing one or multiple wounds 0 []  - Large Wound Dressing one or multiple wounds 0 X - Application of Medications - topical 1 5 []  - Application of Medications - injection 0 INTERVENTIONS - Miscellaneous []  - External ear exam 0 [name], [name] ([id number]) []  - Specimen Collection (cultures, biopsies, blood, body fluids, etc.) 0 []  - Specimen(s) / Culture(s) sent or taken to Lab for analysis 0 []  - Patient Transfer (multiple staff / Michiel Sites Lift / Similar devices) 0 []  - Simple Staple / Suture removal (25 or less) 0 []  - Complex Staple / Suture removal (26 or more) 0 []  - Hypo / Hyperglycemic Management (close monitor of Blood Glucose) 0 []  - Ankle / Brachial Index (ABI) - do not check if billed separately 0 X - Vital Signs 1 5 Has the patient been seen at the hospital within the last three years: Yes Total Score: 75 Level Of Care: New/Established - Level 2 Electronic Signature(s) Signed: 10/30/2015 5:03:09 PM By: Alejandro Mulling Entered By: Alejandro Mulling on 10/25/2015 11:47:10 Keith Guzman ([id number]) -------------------------------------------------------------------------------- Encounter Discharge Information Details Patient Name: Keith Guzman. Date of Service: 10/25/2015 8:45 AM Medical Record Number: [id number] Patient Account Number: [id number] Date of Birth/Sex: 14-Feb-1961 (55 y.o. Male) Treating RN: Phillis Haggis Primary Care Physician: Farris Has Other Clinician: Referring Physician: Farris Has Treating Physician/Extender: Rudene Re in Treatment:  6 Encounter Discharge Information Items Discharge Pain Level: 0 Discharge Condition: Stable Ambulatory Status: Ambulatory Discharge Destination: Home Transportation: Private Auto Accompanied By: wife Schedule Follow-up Appointment: No Medication Reconciliation completed and provided to Patient/Care Yes Jennalee Greaves: Provided on Clinical Summary of Care: 10/25/2015 Form Type Recipient Paper Patient LL Electronic Signature(s) Signed: 10/25/2015 9:53:53 AM By: Gwenlyn Perking Entered By: Gwenlyn Perking on 10/25/2015 09:53:53 Kitson, Benjamine Guzman ([id number]) -------------------------------------------------------------------------------- Lower Extremity Assessment Details Patient Name: Keith Guzman. Date of Service: 10/25/2015 8:45 AM Medical Record Number: [id number] Patient Account Number: [id number] Date of Birth/Sex: February 27, 1961 (55 y.o. Male) Treating RN: Phillis Haggis Primary Care Physician: Farris Has Other Clinician: Referring Physician: Farris Has Treating Physician/Extender: Rudene Re in Treatment: 6 Vascular Assessment Pulses: Posterior Tibial Dorsalis Pedis Palpable: [Right:Yes] Extremity colors, hair growth, and conditions: Extremity Color: [Right:Normal] Temperature of Extremity: [Right:Warm] Capillary Refill: [Right:< 3 seconds] Toe Nail Assessment Left: Right: Thick: No Discolored: No Deformed: No Improper Length and Hygiene: No Electronic Signature(s) Signed: 10/30/2015 5:03:09 PM By: Alejandro Mulling Entered By: Alejandro Mulling on 10/25/2015 09:23:48 Sarrazin, Benjamine Guzman ([id number]) -------------------------------------------------------------------------------- Multi Wound Chart Details Patient Name: Keith Guzman. Date of Service: 10/25/2015 8:45 AM Medical Record Number: [id number] Patient Account Number: [id number] Date of Birth/Sex: 25-Nov-1960 (55 y.o. Male) Treating RN: Phillis Haggis Primary Care Physician: Farris Has Other Clinician: Referring  Physician: Farris Has Treating Physician/Extender: Rudene Re in Treatment: 6 Vital Signs Height(in): 68 Pulse(bpm): 86 Weight(lbs): 171 Blood Pressure 168/86 (mmHg): Body Mass Index(BMI): 26 Temperature(F): 98.1 Respiratory Rate 18 (breaths/min): Photos: [1:No Photos] [N/A:N/A] Wound Location: [1:Right Toe Fifth - Lateral] [N/A:N/A] Wounding Event: [1:Thermal Burn] [N/A:N/A] Primary Etiology: [1:Diabetic Wound/Ulcer of the Lower Extremity] [N/A:N/A] Comorbid History: [1:Hypertension, Type II Diabetes, Neuropathy] [N/A:N/A] Date Acquired: [1:08/27/2015] [N/A:N/A] Weeks of Treatment: [1:6] [N/A:N/A] Wound Status: [1:Healed - Epithelialized] [N/A:N/A] Measurements L x W  x D 0x0x0 [N/A:N/A] (cm) Area (cm) : [1:0] [N/A:N/A] Volume (cm) : [1:0] [N/A:N/A] % Reduction in Area: [1:100.00%] [N/A:N/A] % Reduction in Volume: 100.00% [N/A:N/A] Classification: [1:Grade 1] [N/A:N/A] Exudate Amount: [1:Small] [N/A:N/A] Exudate Type: [1:Serous] [N/A:N/A] Exudate Color: [1:amber] [N/A:N/A] Wound Margin: [1:Flat and Intact] [N/A:N/A] Granulation Amount: [1:None Present (0%)] [N/A:N/A] Necrotic Amount: [1:None Present (0%)] [N/A:N/A] Exposed Structures: [1:Fascia: No Fat: No Tendon: No Muscle: No Joint: No Bone: No] [N/A:N/A] Limited to Skin Breakdown Epithelialization: Large (67-100%) N/A N/A Periwound Skin Texture: Callus: Yes N/A N/A Edema: No Excoriation: No Induration: No Crepitus: No Fluctuance: No Friable: No Rash: No Scarring: No Periwound Skin Maceration: No N/A N/A Moisture: Moist: No Dry/Scaly: No Periwound Skin Color: Atrophie Blanche: No N/A N/A Cyanosis: No Ecchymosis: No Erythema: No Hemosiderin Staining: No Mottled: No Pallor: No Rubor: No Temperature: No Abnormality N/A N/A Tenderness on No N/A N/A Palpation: Wound Preparation: Ulcer Cleansing: N/A N/A Rinsed/Irrigated with Saline Topical Anesthetic Applied: Other:  lidocaine 4% Treatment Notes Electronic Signature(s) Signed: 10/30/2015 5:03:09 PM By: Alejandro Mulling Entered By: Alejandro Mulling on 10/25/2015 09:48:30 Life, Benjamine Guzman ([id number]) -------------------------------------------------------------------------------- Multi-Disciplinary Care Plan Details Patient Name: Keith Guzman. Date of Service: 10/25/2015 8:45 AM Medical Record Number: [id number] Patient Account Number: [id number] Date of Birth/Sex: [birth year] (55 y.o. Male) Treating RN: Phillis Haggis Primary Care Physician: Farris Has Other Clinician: Referring Physician: Farris Has Treating Physician/Extender: Rudene Re in Treatment: 6 Active Inactive Electronic Signature(s) Signed: 10/30/2015 5:03:09 PM By: Alejandro Mulling Entered By: Alejandro Mulling on 10/25/2015 12:03:26 Mcclintock, Benjamine Guzman ([id number]) -------------------------------------------------------------------------------- Pain Assessment Details Patient Name: Keith Guzman. Date of Service: 10/25/2015 8:45 AM Medical Record Number: [id number] Patient Account Number: [id number] Date of Birth/Sex: [birth year] (55 y.o. Male) Treating RN: Phillis Haggis Primary Care Physician: Farris Has Other Clinician: Referring Physician: Farris Has Treating Physician/Extender: Rudene Re in Treatment: 6 Active Problems Location of Pain Severity and Description of Pain Patient Has Paino No Site Locations Pain Management and Medication Current Pain Management: Electronic Signature(s) Signed: 10/30/2015 5:03:09 PM By: Alejandro Mulling Entered By: Alejandro Mulling on 10/25/2015 09:20:34 Clausen, Benjamine Guzman ([id number]) -------------------------------------------------------------------------------- Patient/Caregiver Education Details Patient Name: Keith Guzman. Date of Service: 10/25/2015 8:45 AM Medical Record Number: [id number] Patient Account Number: [id number] Date of Birth/Gender: October 08, 1960 (55 y.o.  Male) Treating RN: Phillis Haggis Primary Care Physician: Farris Has Other Clinician: Referring Physician: Farris Has Treating Physician/Extender: Rudene Re in Treatment: 6 Education Assessment Education Provided To: Patient Education Topics Provided Wound/Skin Impairment: Handouts: Other: keep care clean and dry and let us know if anything changes Methods: Demonstration, Explain/Verbal Responses: State content correctly Electronic Signature(s) Signed: 10/30/2015 5:03:09 PM By: Alejandro Mulling Entered By: Alejandro Mulling on 10/25/2015 09:50:13 Bibbins, Benjamine Guzman ([id number]) -------------------------------------------------------------------------------- Wound Assessment Details Patient Name: Keith Guzman. Date of Service: 10/25/2015 8:45 AM Medical Record Number: [id number] Patient Account Number: [id number] Date of Birth/Sex: 1961-07-11 (55 y.o. Male) Treating RN: Phillis Haggis Primary Care Physician: Farris Has Other Clinician: Referring Physician: Farris Has Treating Physician/Extender: Rudene Re in Treatment: 6 Wound Status Wound Number: 1 Primary Diabetic Wound/Ulcer of the Lower Etiology: Extremity Wound Location: Right Toe Fifth - Lateral Wound Status: Healed - Epithelialized Wounding Event: Thermal Burn Comorbid Hypertension, Type II Diabetes, Date Acquired: 08/27/2015 History: Neuropathy Weeks Of Treatment: 6 Clustered Wound: No Photos Photo Uploaded By: Alejandro Mulling on 10/25/2015 11:52:25 Wound Measurements Length: (cm) 0 % Reduction i Width: (cm) 0 % Reduction i Depth: (cm) 0 Epithelializa Area: (cm) 0 Tunneling: Volume: (  cm) 0 Undermining: n Area: 100% n Volume: 100% tion: Large (67-100%) No No Wound Description Classification: Grade 1 Wound Margin: Flat and Intact Exudate Amount: Small Exudate Type: Serous Exudate Color: amber Foul Odor After Cleansing: No Wound Bed Granulation Amount: None Present (0%)  Exposed Structure Necrotic Amount: None Present (0%) Fascia Exposed: No Fat Layer Exposed: No Tendon Exposed: No Jarvis, Fredy M. ([id number]) Muscle Exposed: No Joint Exposed: No Bone Exposed: No Limited to Skin Breakdown Periwound Skin Texture Texture Color No Abnormalities Noted: No No Abnormalities Noted: No Callus: Yes Atrophie Blanche: No Crepitus: No Cyanosis: No Excoriation: No Ecchymosis: No Fluctuance: No Erythema: No Friable: No Hemosiderin Staining: No Induration: No Mottled: No Localized Edema: No Pallor: No Rash: No Rubor: No Scarring: No Temperature / Pain Moisture Temperature: No Abnormality No Abnormalities Noted: No Dry / Scaly: No Maceration: No Moist: No Wound Preparation Ulcer Cleansing: Rinsed/Irrigated with Saline Topical Anesthetic Applied: Other: lidocaine 4%, Electronic Signature(s) Signed: 10/30/2015 5:03:09 PM By: Alejandro Mulling Entered By: Alejandro Mulling on 10/25/2015 09:48:14 Pedretti, Benjamine Guzman ([id number]) -------------------------------------------------------------------------------- Vitals Details Patient Name: Keith Guzman. Date of Service: 10/25/2015 8:45 AM Medical Record Number: [id number] Patient Account Number: [id number] Date of Birth/Sex: 1961-04-03 (55 y.o. Male) Treating RN: Phillis Haggis Primary Care Physician: Farris Has Other Clinician: Referring Physician: Farris Has Treating Physician/Extender: Rudene Re in Treatment: 6 Vital Signs Time Taken: 09:21 Temperature (F): 98.1 Height (in): 68 Pulse (bpm): 86 Weight (lbs): 171 Respiratory Rate (breaths/min): 18 Body Mass Index (BMI): 26 Blood Pressure (mmHg): 168/86 Reference Range: 80 - 120 mg / dl Electronic Signature(s) Signed: 10/30/2015 5:03:09 PM By: Alejandro Mulling Entered By: Alejandro Mulling on 10/25/2015 09:22:55

## 2015-11-19 ENCOUNTER — Other Ambulatory Visit (HOSPITAL_BASED_OUTPATIENT_CLINIC_OR_DEPARTMENT_OTHER): Payer: 59

## 2015-11-19 ENCOUNTER — Ambulatory Visit: Payer: 59

## 2015-11-19 DIAGNOSIS — N189 Chronic kidney disease, unspecified: Secondary | ICD-10-CM

## 2015-11-19 DIAGNOSIS — D638 Anemia in other chronic diseases classified elsewhere: Secondary | ICD-10-CM | POA: Diagnosis not present

## 2015-11-19 LAB — CBC WITH DIFFERENTIAL/PLATELET
BASO%: 0.4 % (ref 0.0–2.0)
BASOS ABS: 0 10*3/uL (ref 0.0–0.1)
EOS%: 3.5 % (ref 0.0–7.0)
Eosinophils Absolute: 0.2 10*3/uL (ref 0.0–0.5)
HEMATOCRIT: 36.4 % — AB (ref 38.4–49.9)
HGB: 11.2 g/dL — ABNORMAL LOW (ref 13.0–17.1)
LYMPH#: 1.3 10*3/uL (ref 0.9–3.3)
LYMPH%: 18.4 % (ref 14.0–49.0)
MCH: 23.3 pg — AB (ref 27.2–33.4)
MCHC: 30.8 g/dL — AB (ref 32.0–36.0)
MCV: 75.8 fL — ABNORMAL LOW (ref 79.3–98.0)
MONO#: 0.6 10*3/uL (ref 0.1–0.9)
MONO%: 8.4 % (ref 0.0–14.0)
NEUT#: 4.7 10*3/uL (ref 1.5–6.5)
NEUT%: 69.3 % (ref 39.0–75.0)
PLATELETS: 232 10*3/uL (ref 140–400)
RBC: 4.8 10*6/uL (ref 4.20–5.82)
RDW: 16.9 % — ABNORMAL HIGH (ref 11.0–14.6)
WBC: 6.8 10*3/uL (ref 4.0–10.3)

## 2015-11-26 DIAGNOSIS — H4051X3 Glaucoma secondary to other eye disorders, right eye, severe stage: Secondary | ICD-10-CM | POA: Diagnosis not present

## 2015-11-26 DIAGNOSIS — E113553 Type 2 diabetes mellitus with stable proliferative diabetic retinopathy, bilateral: Secondary | ICD-10-CM | POA: Diagnosis not present

## 2015-12-05 ENCOUNTER — Other Ambulatory Visit: Payer: Self-pay | Admitting: Cardiology

## 2015-12-05 NOTE — Telephone Encounter (Signed)
Rx(s) sent to pharmacy electronically.  

## 2015-12-06 ENCOUNTER — Other Ambulatory Visit: Payer: Self-pay | Admitting: Cardiology

## 2015-12-06 NOTE — Telephone Encounter (Signed)
Rx(s) sent to pharmacy electronically.  

## 2015-12-12 DIAGNOSIS — E11319 Type 2 diabetes mellitus with unspecified diabetic retinopathy without macular edema: Secondary | ICD-10-CM | POA: Diagnosis not present

## 2015-12-12 DIAGNOSIS — E1165 Type 2 diabetes mellitus with hyperglycemia: Secondary | ICD-10-CM | POA: Diagnosis not present

## 2015-12-12 DIAGNOSIS — E1140 Type 2 diabetes mellitus with diabetic neuropathy, unspecified: Secondary | ICD-10-CM | POA: Diagnosis not present

## 2015-12-12 DIAGNOSIS — N183 Chronic kidney disease, stage 3 (moderate): Secondary | ICD-10-CM | POA: Diagnosis not present

## 2015-12-12 DIAGNOSIS — I420 Dilated cardiomyopathy: Secondary | ICD-10-CM | POA: Diagnosis not present

## 2015-12-12 DIAGNOSIS — I10 Essential (primary) hypertension: Secondary | ICD-10-CM | POA: Diagnosis not present

## 2015-12-12 DIAGNOSIS — Z794 Long term (current) use of insulin: Secondary | ICD-10-CM | POA: Diagnosis not present

## 2015-12-12 DIAGNOSIS — D649 Anemia, unspecified: Secondary | ICD-10-CM | POA: Diagnosis not present

## 2015-12-17 ENCOUNTER — Ambulatory Visit (HOSPITAL_BASED_OUTPATIENT_CLINIC_OR_DEPARTMENT_OTHER): Payer: 59

## 2015-12-17 ENCOUNTER — Other Ambulatory Visit (HOSPITAL_BASED_OUTPATIENT_CLINIC_OR_DEPARTMENT_OTHER): Payer: 59

## 2015-12-17 ENCOUNTER — Encounter: Payer: Self-pay | Admitting: Hematology

## 2015-12-17 ENCOUNTER — Ambulatory Visit (HOSPITAL_BASED_OUTPATIENT_CLINIC_OR_DEPARTMENT_OTHER): Payer: 59 | Admitting: Hematology

## 2015-12-17 ENCOUNTER — Telehealth: Payer: Self-pay | Admitting: Hematology

## 2015-12-17 ENCOUNTER — Other Ambulatory Visit: Payer: Self-pay | Admitting: Hematology

## 2015-12-17 VITALS — BP 155/82 | HR 85 | Temp 98.5°F | Resp 18 | Ht 71.5 in | Wt 168.4 lb

## 2015-12-17 DIAGNOSIS — D638 Anemia in other chronic diseases classified elsewhere: Secondary | ICD-10-CM

## 2015-12-17 DIAGNOSIS — D631 Anemia in chronic kidney disease: Secondary | ICD-10-CM | POA: Diagnosis not present

## 2015-12-17 DIAGNOSIS — E119 Type 2 diabetes mellitus without complications: Secondary | ICD-10-CM | POA: Diagnosis not present

## 2015-12-17 DIAGNOSIS — D6489 Other specified anemias: Secondary | ICD-10-CM | POA: Diagnosis not present

## 2015-12-17 DIAGNOSIS — N189 Chronic kidney disease, unspecified: Secondary | ICD-10-CM

## 2015-12-17 LAB — COMPREHENSIVE METABOLIC PANEL
ALT: 26 U/L (ref 0–55)
AST: 15 U/L (ref 5–34)
Albumin: 3.2 g/dL — ABNORMAL LOW (ref 3.5–5.0)
Alkaline Phosphatase: 99 U/L (ref 40–150)
Anion Gap: 6 mEq/L (ref 3–11)
BUN: 23.1 mg/dL (ref 7.0–26.0)
CALCIUM: 9.2 mg/dL (ref 8.4–10.4)
CHLORIDE: 104 meq/L (ref 98–109)
CO2: 27 mEq/L (ref 22–29)
Creatinine: 1.9 mg/dL — ABNORMAL HIGH (ref 0.7–1.3)
EGFR: 45 mL/min/{1.73_m2} — ABNORMAL LOW (ref >90)
Glucose: 306 mg/dl — ABNORMAL HIGH (ref 70–140)
POTASSIUM: 4.5 meq/L (ref 3.5–5.1)
Sodium: 137 mEq/L (ref 136–145)
Total Bilirubin: 0.37 mg/dL (ref 0.20–1.20)
Total Protein: 7 g/dL (ref 6.4–8.3)

## 2015-12-17 LAB — CBC WITH DIFFERENTIAL/PLATELET
BASO%: 0.3 % (ref 0.0–2.0)
BASOS ABS: 0 10*3/uL (ref 0.0–0.1)
EOS%: 3.7 % (ref 0.0–7.0)
Eosinophils Absolute: 0.3 10*3/uL (ref 0.0–0.5)
HEMATOCRIT: 32.2 % — AB (ref 38.4–49.9)
HGB: 10 g/dL — ABNORMAL LOW (ref 13.0–17.1)
LYMPH#: 1.3 10*3/uL (ref 0.9–3.3)
LYMPH%: 16.4 % (ref 14.0–49.0)
MCH: 23.3 pg — AB (ref 27.2–33.4)
MCHC: 31.2 g/dL — AB (ref 32.0–36.0)
MCV: 74.7 fL — ABNORMAL LOW (ref 79.3–98.0)
MONO#: 0.8 10*3/uL (ref 0.1–0.9)
MONO%: 10 % (ref 0.0–14.0)
NEUT#: 5.5 10*3/uL (ref 1.5–6.5)
NEUT%: 69.6 % (ref 39.0–75.0)
Platelets: 251 10*3/uL (ref 140–400)
RBC: 4.31 10*6/uL (ref 4.20–5.82)
RDW: 16.2 % — ABNORMAL HIGH (ref 11.0–14.6)
WBC: 7.8 10*3/uL (ref 4.0–10.3)

## 2015-12-17 MED ORDER — DARBEPOETIN ALFA 150 MCG/0.3ML IJ SOSY
150.0000 ug | PREFILLED_SYRINGE | Freq: Once | INTRAMUSCULAR | Status: AC
  Administered 2015-12-17: 150 ug via SUBCUTANEOUS
  Filled 2015-12-17: qty 0.3
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
## 2015-12-17 NOTE — Telephone Encounter (Signed)
Gave and printed appt sched for April thru July

## 2016-01-14 ENCOUNTER — Ambulatory Visit (HOSPITAL_BASED_OUTPATIENT_CLINIC_OR_DEPARTMENT_OTHER): Payer: 59

## 2016-01-14 ENCOUNTER — Other Ambulatory Visit (HOSPITAL_BASED_OUTPATIENT_CLINIC_OR_DEPARTMENT_OTHER): Payer: 59

## 2016-01-14 VITALS — BP 153/77 | HR 86 | Temp 98.0°F

## 2016-01-14 DIAGNOSIS — N189 Chronic kidney disease, unspecified: Secondary | ICD-10-CM

## 2016-01-14 DIAGNOSIS — D631 Anemia in chronic kidney disease: Secondary | ICD-10-CM | POA: Diagnosis not present

## 2016-01-14 DIAGNOSIS — D638 Anemia in other chronic diseases classified elsewhere: Secondary | ICD-10-CM

## 2016-01-14 LAB — CBC WITH DIFFERENTIAL/PLATELET
BASO%: 0.5 % (ref 0.0–2.0)
Basophils Absolute: 0 10*3/uL (ref 0.0–0.1)
EOS%: 3.1 % (ref 0.0–7.0)
Eosinophils Absolute: 0.3 10*3/uL (ref 0.0–0.5)
HCT: 31.4 % — ABNORMAL LOW (ref 38.4–49.9)
HGB: 9.5 g/dL — ABNORMAL LOW (ref 13.0–17.1)
LYMPH%: 13.2 % — ABNORMAL LOW (ref 14.0–49.0)
MCH: 23.4 pg — ABNORMAL LOW (ref 27.2–33.4)
MCHC: 30.3 g/dL — AB (ref 32.0–36.0)
MCV: 77.2 fL — ABNORMAL LOW (ref 79.3–98.0)
MONO#: 0.7 10*3/uL (ref 0.1–0.9)
MONO%: 8.5 % (ref 0.0–14.0)
NEUT%: 74.7 % (ref 39.0–75.0)
NEUTROS ABS: 6 10*3/uL (ref 1.5–6.5)
PLATELETS: 275 10*3/uL (ref 140–400)
RBC: 4.07 10*6/uL — ABNORMAL LOW (ref 4.20–5.82)
RDW: 16.6 % — ABNORMAL HIGH (ref 11.0–14.6)
WBC: 8 10*3/uL (ref 4.0–10.3)
lymph#: 1.1 10*3/uL (ref 0.9–3.3)

## 2016-01-14 LAB — IRON AND TIBC
%SAT: 22 % (ref 20–55)
IRON: 48 ug/dL (ref 42–163)
TIBC: 216 ug/dL (ref 202–409)
UIBC: 168 ug/dL (ref 117–376)

## 2016-01-14 LAB — FERRITIN: FERRITIN: 465 ng/mL — AB (ref 22–316)

## 2016-01-14 MED ORDER — DARBEPOETIN ALFA 150 MCG/0.3ML IJ SOSY
150.0000 ug | PREFILLED_SYRINGE | Freq: Once | INTRAMUSCULAR | Status: AC
  Administered 2016-01-14: 150 ug via SUBCUTANEOUS
  Filled 2016-01-14: qty 0.3

## 2016-01-17 DIAGNOSIS — Z89432 Acquired absence of left foot: Secondary | ICD-10-CM | POA: Diagnosis not present

## 2016-01-17 DIAGNOSIS — E1165 Type 2 diabetes mellitus with hyperglycemia: Secondary | ICD-10-CM | POA: Diagnosis not present

## 2016-01-17 DIAGNOSIS — N183 Chronic kidney disease, stage 3 (moderate): Secondary | ICD-10-CM | POA: Diagnosis not present

## 2016-01-17 DIAGNOSIS — E1122 Type 2 diabetes mellitus with diabetic chronic kidney disease: Secondary | ICD-10-CM | POA: Diagnosis not present

## 2016-01-17 DIAGNOSIS — Z794 Long term (current) use of insulin: Secondary | ICD-10-CM | POA: Diagnosis not present

## 2016-02-05 DIAGNOSIS — L089 Local infection of the skin and subcutaneous tissue, unspecified: Secondary | ICD-10-CM | POA: Diagnosis not present

## 2016-02-05 DIAGNOSIS — E1169 Type 2 diabetes mellitus with other specified complication: Secondary | ICD-10-CM | POA: Diagnosis not present

## 2016-02-05 DIAGNOSIS — Z794 Long term (current) use of insulin: Secondary | ICD-10-CM | POA: Diagnosis not present

## 2016-02-10 ENCOUNTER — Ambulatory Visit: Payer: 59 | Admitting: Surgery

## 2016-02-11 ENCOUNTER — Ambulatory Visit (HOSPITAL_BASED_OUTPATIENT_CLINIC_OR_DEPARTMENT_OTHER): Payer: 59

## 2016-02-11 ENCOUNTER — Other Ambulatory Visit (HOSPITAL_BASED_OUTPATIENT_CLINIC_OR_DEPARTMENT_OTHER): Payer: 59

## 2016-02-11 VITALS — BP 155/65 | HR 85 | Temp 98.2°F | Resp 18

## 2016-02-11 DIAGNOSIS — D631 Anemia in chronic kidney disease: Secondary | ICD-10-CM | POA: Diagnosis not present

## 2016-02-11 DIAGNOSIS — D638 Anemia in other chronic diseases classified elsewhere: Secondary | ICD-10-CM

## 2016-02-11 DIAGNOSIS — N189 Chronic kidney disease, unspecified: Secondary | ICD-10-CM

## 2016-02-11 LAB — CBC WITH DIFFERENTIAL/PLATELET
BASO%: 0.6 % (ref 0.0–2.0)
Basophils Absolute: 0 10*3/uL (ref 0.0–0.1)
EOS ABS: 0.3 10*3/uL (ref 0.0–0.5)
EOS%: 3.6 % (ref 0.0–7.0)
HCT: 34.2 % — ABNORMAL LOW (ref 38.4–49.9)
HEMOGLOBIN: 10.4 g/dL — AB (ref 13.0–17.1)
LYMPH%: 18 % (ref 14.0–49.0)
MCH: 23.3 pg — ABNORMAL LOW (ref 27.2–33.4)
MCHC: 30.3 g/dL — ABNORMAL LOW (ref 32.0–36.0)
MCV: 76.9 fL — AB (ref 79.3–98.0)
MONO#: 0.6 10*3/uL (ref 0.1–0.9)
MONO%: 9.1 % (ref 0.0–14.0)
NEUT%: 68.7 % (ref 39.0–75.0)
NEUTROS ABS: 4.8 10*3/uL (ref 1.5–6.5)
Platelets: 226 10*3/uL (ref 140–400)
RBC: 4.45 10*6/uL (ref 4.20–5.82)
RDW: 16.2 % — AB (ref 11.0–14.6)
WBC: 7 10*3/uL (ref 4.0–10.3)
lymph#: 1.3 10*3/uL (ref 0.9–3.3)

## 2016-02-11 MED ORDER — DARBEPOETIN ALFA 150 MCG/0.3ML IJ SOSY
150.0000 ug | PREFILLED_SYRINGE | Freq: Once | INTRAMUSCULAR | Status: AC
  Administered 2016-02-11: 150 ug via SUBCUTANEOUS
  Filled 2016-02-11: qty 0.3

## 2016-02-11 NOTE — Patient Instructions (Signed)
Darbepoetin Alfa injection What is this medicine? DARBEPOETIN ALFA (dar be POE e tin AL fa) helps your body make more red blood cells. It is used to treat anemia caused by chronic kidney failure and chemotherapy. This medicine may be used for other purposes; ask your health care provider or pharmacist if you have questions. COMMON BRAND NAME(S): Aranesp What should I tell my health care provider before I take this medicine? They need to know if you have any of these conditions: -blood clotting disorders or history of blood clots -cancer patient not on chemotherapy -cystic fibrosis -heart disease, such as angina, heart failure, or a history of a heart attack -hemoglobin level of 12 g/dL or greater -high blood pressure -low levels of folate, iron, or vitamin B12 -seizures -an unusual or allergic reaction to darbepoetin, erythropoietin, albumin, hamster proteins, latex, other medicines, foods, dyes, or preservatives -pregnant or trying to get pregnant -breast-feeding How should I use this medicine? This medicine is for injection into a vein or under the skin. It is usually given by a health care professional in a hospital or clinic setting. If you get this medicine at home, you will be taught how to prepare and give this medicine. Do not shake the solution before you withdraw a dose. Use exactly as directed. Take your medicine at regular intervals. Do not take your medicine more often than directed. It is important that you put your used needles and syringes in a special sharps container. Do not put them in a trash can. If you do not have a sharps container, call your pharmacist or healthcare provider to get one. Talk to your pediatrician regarding the use of this medicine in children. While this medicine may be used in children as young as 1 year for selected conditions, precautions do apply. Overdosage: If you think you have taken too much of this medicine contact a poison control center or  emergency room at once. NOTE: This medicine is only for you. Do not share this medicine with others. What if I miss a dose? If you miss a dose, take it as soon as you can. If it is almost time for your next dose, take only that dose. Do not take double or extra doses. What may interact with this medicine? Do not take this medicine with any of the following medications: -epoetin alfa This list may not describe all possible interactions. Give your health care provider a list of all the medicines, herbs, non-prescription drugs, or dietary supplements you use. Also tell them if you smoke, drink alcohol, or use illegal drugs. Some items may interact with your medicine. What should I watch for while using this medicine? Visit your prescriber or health care professional for regular checks on your progress and for the needed blood tests and blood pressure measurements. It is especially important for the doctor to make sure your hemoglobin level is in the desired range, to limit the risk of potential side effects and to give you the best benefit. Keep all appointments for any recommended tests. Check your blood pressure as directed. Ask your doctor what your blood pressure should be and when you should contact him or her. As your body makes more red blood cells, you may need to take iron, folic acid, or vitamin B supplements. Ask your doctor or health care provider which products are right for you. If you have kidney disease continue dietary restrictions, even though this medication can make you feel better. Talk with your doctor or health   care professional about the foods you eat and the vitamins that you take. What side effects may I notice from receiving this medicine? Side effects that you should report to your doctor or health care professional as soon as possible: -allergic reactions like skin rash, itching or hives, swelling of the face, lips, or tongue -breathing problems -changes in vision -chest  pain -confusion, trouble speaking or understanding -feeling faint or lightheaded, falls -high blood pressure -muscle aches or pains -pain, swelling, warmth in the leg -rapid weight gain -severe headaches -sudden numbness or weakness of the face, arm or leg -trouble walking, dizziness, loss of balance or coordination -seizures (convulsions) -swelling of the ankles, feet, hands -unusually weak or tired Side effects that usually do not require medical attention (report to your doctor or health care professional if they continue or are bothersome): -diarrhea -fever, chills (flu-like symptoms) -headaches -nausea, vomiting -redness, stinging, or swelling at site where injected This list may not describe all possible side effects. Call your doctor for medical advice about side effects. You may report side effects to FDA at 1-800-FDA-1088. Where should I keep my medicine? Keep out of the reach of children. Store in a refrigerator between 2 and 8 degrees C (36 and 46 degrees F). Do not freeze. Do not shake. Throw away any unused portion if using a single-dose vial. Throw away any unused medicine after the expiration date. NOTE: This sheet is a summary. It may not cover all possible information. If you have questions about this medicine, talk to your doctor, pharmacist, or health care provider.  2015, Elsevier/Gold Standard. (2008-08-28 10:23:57)  

## 2016-02-12 ENCOUNTER — Other Ambulatory Visit: Payer: Self-pay | Admitting: Cardiology

## 2016-02-12 NOTE — Telephone Encounter (Signed)
Rx request sent to pharmacy.  

## 2016-02-14 ENCOUNTER — Encounter: Payer: 59 | Attending: Surgery | Admitting: Surgery

## 2016-02-14 DIAGNOSIS — E7800 Pure hypercholesterolemia, unspecified: Secondary | ICD-10-CM | POA: Insufficient documentation

## 2016-02-14 DIAGNOSIS — Z87891 Personal history of nicotine dependence: Secondary | ICD-10-CM | POA: Diagnosis not present

## 2016-02-14 DIAGNOSIS — E1140 Type 2 diabetes mellitus with diabetic neuropathy, unspecified: Secondary | ICD-10-CM | POA: Insufficient documentation

## 2016-02-14 DIAGNOSIS — T25221A Burn of second degree of right foot, initial encounter: Secondary | ICD-10-CM | POA: Diagnosis not present

## 2016-02-14 DIAGNOSIS — X58XXXA Exposure to other specified factors, initial encounter: Secondary | ICD-10-CM | POA: Insufficient documentation

## 2016-02-14 DIAGNOSIS — L97512 Non-pressure chronic ulcer of other part of right foot with fat layer exposed: Secondary | ICD-10-CM | POA: Insufficient documentation

## 2016-02-14 DIAGNOSIS — I10 Essential (primary) hypertension: Secondary | ICD-10-CM | POA: Diagnosis not present

## 2016-02-14 DIAGNOSIS — I420 Dilated cardiomyopathy: Secondary | ICD-10-CM | POA: Insufficient documentation

## 2016-02-14 DIAGNOSIS — E11621 Type 2 diabetes mellitus with foot ulcer: Secondary | ICD-10-CM | POA: Diagnosis not present

## 2016-02-14 DIAGNOSIS — L97511 Non-pressure chronic ulcer of other part of right foot limited to breakdown of skin: Secondary | ICD-10-CM | POA: Diagnosis not present

## 2016-02-14 DIAGNOSIS — Z794 Long term (current) use of insulin: Secondary | ICD-10-CM | POA: Insufficient documentation

## 2016-02-14 NOTE — Progress Notes (Addendum)
AMARIE, TARTE ([id number]) Visit Report for 02/14/2016 Allergy List Details Patient Name: Keith Guzman, Keith Guzman. Date of Service: 02/14/2016 8:00 AM Medical Record Number: [id number] Patient Account Number: [id number] Date of Birth/Sex: 1960-11-17 (55 y.o. Male) Treating RN: Clover Mealy, RN, BSN, [place] Sink Primary Care Physician: Farris Has Other Clinician: Referring Physician: Farris Has Treating Physician/Extender: Rudene Re in Treatment: 0 Allergies Active Allergies no known allergies Allergy Notes Electronic Signature(s) Signed: 02/14/2016 7:54:24 AM By: Elpidio Eric BSN, RN Entered By: Elpidio Eric on 02/14/2016 07:54:24 [name] ([id number]) -------------------------------------------------------------------------------- Arrival Information Details Patient Name: [name] Date of Service: 02/14/2016 8:00 AM Medical Record Number: [id number] Patient Account Number: [id number] Date of Birth/Sex: [birth year] (55 y.o. Male) Treating RN: Clover Mealy, RN, BSN, [place] Sink Primary Care Physician: Farris Has Other Clinician: Referring Physician: Farris Has Treating Physician/Extender: Rudene Re in Treatment: 0 Visit Information Patient Arrived: Ambulatory Arrival Time: 07:53 Accompanied By: wife Transfer Assistance: None Patient Identification Verified: Yes Secondary Verification Process Yes Completed: Patient Requires Transmission- No Based Precautions: Patient Has Alerts: Yes Patient Alerts: ABI(08/2015)L:0.83 R:0.89 History Since Last Visit Added or deleted any medications: No Any new allergies or adverse reactions: No Had a fall or experienced change in activities of daily living that may affect risk of falls: No Signs or symptoms of abuse/neglect since last visito No Electronic Signature(s) Signed: 02/14/2016 2:05:33 PM By: Elpidio Eric BSN, RN Previous Signature: 02/14/2016 7:54:05 AM Version By: Elpidio Eric BSN, RN Entered By: Elpidio Eric on 02/14/2016  08:58:26 Botero, [name] ([id number]) -------------------------------------------------------------------------------- Clinic Level of Care Assessment Details Patient Name: [name] Date of Service: 02/14/2016 8:00 AM Medical Record Number: [id number] Patient Account Number: [id number] Date of Birth/Sex: 17-Apr-1961 (55 y.o. Male) Treating RN: Clover Mealy, RN, BSN, [place] Sink Primary Care Physician: Farris Has Other Clinician: Referring Physician: Farris Has Treating Physician/Extender: Rudene Re in Treatment: 0 Clinic Level of Care Assessment Items TOOL 2 Quantity Score  - Use when only an EandM is performed on the INITIAL visit 0 ASSESSMENTS - Nursing Assessment / Reassessment X - General Physical Exam (combine w/ comprehensive assessment (listed just 1 20 below) when performed on new pt. evals) X - Comprehensive Assessment (HX, ROS, Risk Assessments, Wounds Hx, etc.) 1 25 ASSESSMENTS - Wound and Skin Assessment / Reassessment  - Simple Wound Assessment / Reassessment - one wound 0 X - Complex Wound Assessment / Reassessment - multiple wounds 2 5  - Dermatologic / Skin Assessment (not related to wound area) 0 ASSESSMENTS - Ostomy and/or Continence Assessment and Care  - Incontinence Assessment and Management 0  - Ostomy Care Assessment and Management (repouching, etc.) 0 PROCESS - Coordination of Care X - Simple Patient / Family Education for ongoing care 1 15  - Complex (extensive) Patient / Family Education for ongoing care 0  - Staff obtains Chiropractor, Records, Test Results / Process Orders 0  - Staff telephones HHA, Nursing Homes / Clarify orders / etc 0  - Routine Transfer to another Facility (non-emergent condition) 0  - Routine Hospital Admission (non-emergent condition) 0  - New Admissions / Manufacturing engineer / Ordering NPWT, Apligraf, etc. 0  - Emergency Hospital Admission (emergent condition) 0  - Simple Discharge Coordination  0 Hilgers, Velmer M. ([id number])  - Complex (extensive) Discharge Coordination 0 PROCESS - Special Needs  - Pediatric / Minor Patient Management 0  - Isolation Patient Management 0  - Hearing / Language / Visual special needs 0  - Assessment of Community assistance (transportation, D/C planning, etc.)  0 []  - Additional assistance / Altered mentation 0 []  - Support Surface(s) Assessment (bed, cushion, seat, etc.) 0 INTERVENTIONS - Wound Cleansing / Measurement X - Wound Imaging (photographs - any number of wounds) 1 5 []  - Wound Tracing (instead of photographs) 0 []  - Simple Wound Measurement - one wound 0 X - Complex Wound Measurement - multiple wounds 2 5 []  - Simple Wound Cleansing - one wound 0 []  - Complex Wound Cleansing - multiple wounds 0 INTERVENTIONS - Wound Dressings X - Small Wound Dressing one or multiple wounds 1 10 []  - Medium Wound Dressing one or multiple wounds 0 []  - Large Wound Dressing one or multiple wounds 0 []  - Application of Medications - injection 0 INTERVENTIONS - Miscellaneous []  - External ear exam 0 []  - Specimen Collection (cultures, biopsies, blood, body fluids, etc.) 0 []  - Specimen(s) / Culture(s) sent or taken to Lab for analysis 0 []  - Patient Transfer (multiple staff / Nurse, adult / Similar devices) 0 []  - Simple Staple / Suture removal (25 or less) 0 []  - Complex Staple / Suture removal (26 or more) 0 Sheppard, Frazier M. ([id number]) []  - Hypo / Hyperglycemic Management (close monitor of Blood Glucose) 0 []  - Ankle / Brachial Index (ABI) - do not check if billed separately 0 Has the patient been seen at the hospital within the last three years: Yes Total Score: 95 Level Of Care: New/Established - Level 3 Electronic Signature(s) Signed: 02/14/2016 2:05:33 PM By: Elpidio Eric BSN, RN Entered By: Elpidio Eric on 02/14/2016 08:56:31 Turman, [name] ([id number]) -------------------------------------------------------------------------------- Encounter  Discharge Information Details Patient Name: [name] Date of Service: 02/14/2016 8:00 AM Medical Record Number: [id number] Patient Account Number: [id number] Date of Birth/Sex: 1961-07-05 (55 y.o. Male) Treating RN: Clover Mealy, RN, BSN, [place] Sink Primary Care Physician: Farris Has Other Clinician: Referring Physician: Farris Has Treating Physician/Extender: Rudene Re in Treatment: 0 Encounter Discharge Information Items Discharge Pain Level: 0 Discharge Condition: Stable Ambulatory Status: Ambulatory Discharge Destination: Home Transportation: Private Auto Accompanied By: wife Schedule Follow-up Appointment: No Medication Reconciliation completed and provided to Patient/Care No Jarrah Babich: Provided on Clinical Summary of Care: 02/14/2016 Form Type Recipient Paper Patient LL Electronic Signature(s) Signed: 02/14/2016 2:05:33 PM By: Elpidio Eric BSN, RN Previous Signature: 02/14/2016 8:32:38 AM Version By: Gwenlyn Perking Entered By: Elpidio Eric on 02/14/2016 08:59:30 Nabi, [name] ([id number]) -------------------------------------------------------------------------------- Lower Extremity Assessment Details Patient Name: [name] Date of Service: 02/14/2016 8:00 AM Medical Record Number: [id number] Patient Account Number: [id number] Date of Birth/Sex: 13-Aug-1961 (55 y.o. Male) Treating RN: Clover Mealy, RN, BSN, [place] Sink Primary Care Physician: Farris Has Other Clinician: Referring Physician: Farris Has Treating Physician/Extender: Rudene Re in Treatment: 0 Edema Assessment Assessed: Kyra Searles: No] [Right: No] Edema: [Left: N] [Right: o] Vascular Assessment Claudication: Claudication Assessment [Left:None] [Right:None] Pulses: Posterior Tibial Dorsalis Pedis Palpable: [Left:Yes] [Right:Yes] Extremity colors, hair growth, and conditions: Extremity Color: [Left:Normal] [Right:Normal] Hair Growth on Extremity: [Left:No] [Right:No] Temperature of Extremity:  [Left:Warm] [Right:Warm] Capillary Refill: [Left:< 3 seconds] [Right:< 3 seconds] Toe Nail Assessment Left: Right: Thick: No Discolored: No Deformed: No Improper Length and Hygiene: No Electronic Signature(s) Signed: 02/14/2016 2:05:33 PM By: Elpidio Eric BSN, RN Entered By: Elpidio Eric on 02/14/2016 08:07:33 Carattini, [name] ([id number]) -------------------------------------------------------------------------------- Multi Wound Chart Details Patient Name: [name] Date of Service: 02/14/2016 8:00 AM Medical Record Number: [id number] Patient Account Number: [id number] Date of Birth/Sex: 01-02-1961 (55 y.o. Male) Treating RN: Clover Mealy, RN, BSN, [place] Sink Primary Care Physician: Farris Has  Other Clinician: Referring Physician: Farris Has Treating Physician/Extender: Rudene Re in Treatment: 0 Vital Signs Height(in): 71 Pulse(bpm): 81 Weight(lbs): 167 Blood Pressure 149/80 (mmHg): Body Mass Index(BMI): 23 Temperature(F): 98.3 Respiratory Rate 17 (breaths/min): Photos: [2:No Photos] [3:No Photos] [N/A:N/A] Wound Location: [2:Toe Second - Medial] [3:Right Toe Fifth - Lateral] [N/A:N/A] Wounding Event: [2:Gradually Appeared] [3:Gradually Appeared] [N/A:N/A] Primary Etiology: [2:Diabetic Wound/Ulcer of the Lower Extremity] [3:Diabetic Wound/Ulcer of the Lower Extremity] [N/A:N/A] Comorbid History: [2:Hypertension, Type II Diabetes, Neuropathy] [3:Hypertension, Type II Diabetes, Neuropathy] [N/A:N/A] Date Acquired: [2:01/14/2016] [3:01/14/2016] [N/A:N/A] Weeks of Treatment: [2:0] [3:0] [N/A:N/A] Wound Status: [2:Open] [3:Open] [N/A:N/A] Measurements L x W x D 0.3x0.3x0.1 [3:0.3x0.3x0.1] [N/A:N/A] (cm) Area (cm) : [2:0.071] [3:0.071] [N/A:N/A] Volume (cm) : [2:0.007] [3:0.007] [N/A:N/A] % Reduction in Area: [2:0.00%] [3:N/A] [N/A:N/A] % Reduction in Volume: 0.00% [3:N/A] [N/A:N/A] Classification: [2:Grade 0] [3:Grade 0] [N/A:N/A] Exudate Amount: [2:Small] [3:None  Present] [N/A:N/A] Exudate Type: [2:Serous] [3:N/A] [N/A:N/A] Exudate Color: [2:amber] [3:N/A] [N/A:N/A] Wound Margin: [2:N/A] [3:Distinct, outline attached] [N/A:N/A] Granulation Amount: [2:Medium (34-66%)] [3:Large (67-100%)] [N/A:N/A] Granulation Quality: [2:Pink, Pale] [3:N/A] [N/A:N/A] Necrotic Amount: [2:Small (1-33%)] [3:None Present (0%)] [N/A:N/A] Exposed Structures: [2:Fascia: No Fat: No Tendon: No Muscle: No Joint: No Bone: No] [3:Fascia: No Fat: No Tendon: No Muscle: No Joint: No Bone: No] [N/A:N/A] Limited to Skin Limited to Skin Breakdown Breakdown Epithelialization: Small (1-33%) None N/A Periwound Skin Texture: Edema: No Edema: No N/A Excoriation: No Excoriation: No Induration: No Induration: No Callus: No Callus: No Crepitus: No Crepitus: No Fluctuance: No Fluctuance: No Friable: No Friable: No Rash: No Rash: No Scarring: No Scarring: No Periwound Skin Moist: Yes Moist: Yes N/A Moisture: Maceration: No Maceration: No Dry/Scaly: No Dry/Scaly: No Periwound Skin Color: Atrophie Blanche: No Atrophie Blanche: No N/A Cyanosis: No Cyanosis: No Ecchymosis: No Ecchymosis: No Erythema: No Erythema: No Hemosiderin Staining: No Hemosiderin Staining: No Mottled: No Mottled: No Pallor: No Pallor: No Rubor: No Rubor: No Temperature: No Abnormality No Abnormality N/A Tenderness on No No N/A Palpation: Wound Preparation: Ulcer Cleansing: Ulcer Cleansing: N/A Rinsed/Irrigated with Rinsed/Irrigated with Saline Saline Topical Anesthetic Topical Anesthetic Applied: Other: lidocaine Applied: Other: lidocaine 4% 4% Treatment Notes Electronic Signature(s) Signed: 02/14/2016 2:05:33 PM By: Elpidio Eric BSN, RN Entered By: Elpidio Eric on 02/14/2016 08:25:34 [name] ([id number]) -------------------------------------------------------------------------------- Multi-Disciplinary Care Plan Details Patient Name: [name] Date of Service: 02/14/2016 8:00  AM Medical Record Number: [id number] Patient Account Number: [id number] Date of Birth/Sex: 05-02-1961 (55 y.o. Male) Treating RN: Clover Mealy, RN, BSN, [place] Sink Primary Care Physician: Farris Has Other Clinician: Referring Physician: Farris Has Treating Physician/Extender: Rudene Re in Treatment: 0 Active Inactive Orientation to the Wound Care Program Nursing Diagnoses: Knowledge deficit related to the wound healing center program Goals: Patient/caregiver will verbalize understanding of the Wound Healing Center Program Date Initiated: 02/14/2016 Goal Status: Active Interventions: Provide education on orientation to the wound center Notes: Peripheral Neuropathy Nursing Diagnoses: Knowledge deficit related to disease process and management of peripheral neurovascular dysfunction Potential alteration in peripheral tissue perfusion (select prior to confirmation of diagnosis) Goals: Patient/caregiver will verbalize understanding of disease process and disease management Date Initiated: 02/14/2016 Goal Status: Active Interventions: Provide education on Management of Neuropathy upon discharge from the Wound Center Treatment Activities: Patient referred for customized footwear/offloading : 02/14/2016 Notes: Wound/Skin Impairment Nursing Diagnoses: Impaired tissue integrity Handy, Keighan M. ([id number]) Knowledge deficit related to smoking impact on wound healing Knowledge deficit related to ulceration/compromised skin integrity Goals: Patient/caregiver will verbalize understanding of skin care regimen Date Initiated: 02/14/2016 Goal  Status: Active Ulcer/skin breakdown will have a volume reduction of 30% by week 4 Date Initiated: 02/14/2016 Goal Status: Active Ulcer/skin breakdown will have a volume reduction of 50% by week 8 Date Initiated: 02/14/2016 Goal Status: Active Ulcer/skin breakdown will have a volume reduction of 80% by week 12 Date Initiated: 02/14/2016 Goal Status:  Active Ulcer/skin breakdown will heal within 14 weeks Date Initiated: 02/14/2016 Goal Status: Active Interventions: Assess patient/caregiver ability to obtain necessary supplies Assess patient/caregiver ability to perform ulcer/skin care regimen upon admission and as needed Assess ulceration(s) every visit Provide education on ulcer and skin care Treatment Activities: Skin care regimen initiated : 02/14/2016 Topical wound management initiated : 02/14/2016 Notes: Electronic Signature(s) Signed: 02/14/2016 2:05:33 PM By: Elpidio Eric BSN, RN Entered By: Elpidio Eric on 02/14/2016 08:25:23 [name] ([id number]) -------------------------------------------------------------------------------- Pain Assessment Details Patient Name: [name] Date of Service: 02/14/2016 8:00 AM Medical Record Number: [id number] Patient Account Number: [id number] Date of Birth/Sex: 1960-10-23 (55 y.o. Male) Treating RN: Clover Mealy, RN, BSN, [place] Sink Primary Care Physician: Farris Has Other Clinician: Referring Physician: Farris Has Treating Physician/Extender: Rudene Re in Treatment: 0 Active Problems Location of Pain Severity and Description of Pain Patient Has Paino No Site Locations Pain Management and Medication Current Pain Management: Electronic Signature(s) Signed: 02/14/2016 2:05:33 PM By: Elpidio Eric BSN, RN Entered By: Elpidio Eric on 02/14/2016 08:01:35 [name] ([id number]) -------------------------------------------------------------------------------- Patient/Caregiver Education Details Patient Name: [name] Date of Service: 02/14/2016 8:00 AM Medical Record Number: [id number] Patient Account Number: [id number] Date of Birth/Gender: Feb 01, 1961 (55 y.o. Male) Treating RN: Clover Mealy, RN, BSN, [place] Sink Primary Care Physician: Farris Has Other Clinician: Referring Physician: Farris Has Treating Physician/Extender: Rudene Re in Treatment: 0 Education  Assessment Education Provided To: Patient Education Topics Provided Peripheral Neuropathy: Methods: Explain/Verbal Responses: State content correctly Welcome To The Wound Care Center: Methods: Explain/Verbal Responses: State content correctly Wound/Skin Impairment: Methods: Explain/Verbal Responses: State content correctly Electronic Signature(s) Signed: 02/14/2016 2:05:33 PM By: Elpidio Eric BSN, RN Entered By: Elpidio Eric on 02/14/2016 08:59:46 Koziel, [name] ([id number]) -------------------------------------------------------------------------------- Wound Assessment Details Patient Name: [name] Date of Service: 02/14/2016 8:00 AM Medical Record Number: [id number] Patient Account Number: [id number] Date of Birth/Sex: [birth year] (55 y.o. Male) Treating RN: Clover Mealy, RN, BSN, [place] Primary Care Physician: Farris Has Other Clinician: Referring Physician: Farris Has Treating Physician/Extender: Rudene Re in Treatment: 0 Wound Status Wound Number: 2 Primary Diabetic Wound/Ulcer of the Lower Etiology: Extremity Wound Location: Toe Second - Medial Wound Status: Open Wounding Event: Gradually Appeared Comorbid Hypertension, Type II Diabetes, Date Acquired: 01/14/2016 History: Neuropathy Weeks Of Treatment: 0 Clustered Wound: No Photos Photo Uploaded By: Elpidio Eric on 02/14/2016 14:04:09 Wound Measurements Length: (cm) 0.3 Width: (cm) 0.3 Depth: (cm) 0.1 Area: (cm) 0.071 Volume: (cm) 0.007 % Reduction in Area: 0% % Reduction in Volume: 0% Epithelialization: Small (1-33%) Tunneling: No Undermining: No Wound Description Classification: Grade 0 Exudate Amount: Small Exudate Type: Serous Exudate Color: amber Foul Odor After Cleansing: No Wound Bed Granulation Amount: Medium (34-66%) Exposed Structure Granulation Quality: Pink, Pale Fascia Exposed: No Necrotic Amount: Small (1-33%) Fat Layer Exposed: No Necrotic Quality: Adherent Slough Tendon  Exposed: No Muscle Exposed: No Dax, Obi M. ([id number]) Joint Exposed: No Bone Exposed: No Limited to Skin Breakdown Periwound Skin Texture Texture Color No Abnormalities Noted: No No Abnormalities Noted: No Callus: No Atrophie Blanche: No Crepitus: No Cyanosis: No Excoriation: No Ecchymosis: No Fluctuance: No Erythema: No Friable: No Hemosiderin Staining: No Induration: No Mottled:  No Localized Edema: No Pallor: No Rash: No Rubor: No Scarring: No Temperature / Pain Moisture Temperature: No Abnormality No Abnormalities Noted: No Dry / Scaly: No Maceration: No Moist: Yes Wound Preparation Ulcer Cleansing: Rinsed/Irrigated with Saline Topical Anesthetic Applied: Other: lidocaine 4%, Treatment Notes Wound #2 (Medial Toe Second) 1. Cleansed with: Clean wound with Normal Saline 4. Dressing Applied: Medihoney Gel 5. Secondary Dressing Applied Kerlix/Conform 7. Secured with Secretary/administrator) Signed: 02/14/2016 2:05:33 PM By: Elpidio Eric BSN, RN Entered By: Elpidio Eric on 02/14/2016 08:10:36 Kazlauskas, [name] ([id number]) -------------------------------------------------------------------------------- Wound Assessment Details Patient Name: [name] Date of Service: 02/14/2016 8:00 AM Medical Record Number: [id number] Patient Account Number: [id number] Date of Birth/Sex: 14-Feb-1961 (55 y.o. Male) Treating RN: Clover Mealy, RN, BSN, [place] Primary Care Physician: Farris Has Other Clinician: Referring Physician: Farris Has Treating Physician/Extender: Rudene Re in Treatment: 0 Wound Status Wound Number: 3 Primary Diabetic Wound/Ulcer of the Lower Etiology: Extremity Wound Location: Right Toe Fifth - Lateral Wound Status: Open Wounding Event: Gradually Appeared Comorbid Hypertension, Type II Diabetes, Date Acquired: 01/14/2016 History: Neuropathy Weeks Of Treatment: 0 Clustered Wound: No Wound Measurements Length: (cm) 0.3 Width: (cm)  0.3 Depth: (cm) 0.1 Area: (cm) 0.071 Volume: (cm) 0.007 % Reduction in Area: % Reduction in Volume: Epithelialization: None Tunneling: No Undermining: No Wound Description Classification: Grade 0 Wound Margin: Distinct, outline attached Exudate Amount: None Present Foul Odor After Cleansing: No Wound Bed Granulation Amount: Large (67-100%) Exposed Structure Necrotic Amount: None Present (0%) Fascia Exposed: No Fat Layer Exposed: No Tendon Exposed: No Muscle Exposed: No Joint Exposed: No Bone Exposed: No Limited to Skin Breakdown Periwound Skin Texture Texture Color No Abnormalities Noted: No No Abnormalities Noted: No Callus: No Atrophie Blanche: No Crepitus: No Cyanosis: No Excoriation: No Ecchymosis: No Fluctuance: No Erythema: No Friable: No Hemosiderin Staining: No Induration: No Mottled: No Mcbryar, Zian M. ([id number]) Localized Edema: No Pallor: No Rash: No Rubor: No Scarring: No Temperature / Pain Moisture Temperature: No Abnormality No Abnormalities Noted: No Dry / Scaly: No Maceration: No Moist: Yes Wound Preparation Ulcer Cleansing: Rinsed/Irrigated with Saline Topical Anesthetic Applied: Other: lidocaine 4%, Treatment Notes Wound #3 (Right, Lateral Toe Fifth) 1. Cleansed with: Clean wound with Normal Saline 4. Dressing Applied: Medihoney Gel 5. Secondary Dressing Applied Kerlix/Conform 7. Secured with Secretary/administrator) Signed: 02/14/2016 2:05:33 PM By: Elpidio Eric BSN, RN Entered By: Elpidio Eric on 02/14/2016 08:12:39 [name] ([id number]) -------------------------------------------------------------------------------- Vitals Details Patient Name: [name] Date of Service: 02/14/2016 8:00 AM Medical Record Number: [id number] Patient Account Number: [id number] Date of Birth/Sex: 07/29/1961 (55 y.o. Male) Treating RN: Afful, RN, BSN, Rita Primary Care Physician: Farris Has Other Clinician: Referring Physician:  Farris Has Treating Physician/Extender: Rudene Re in Treatment: 0 Vital Signs Time Taken: 08:03 Temperature (F): 98.3 Height (in): 71 Pulse (bpm): 81 Source: Stated Respiratory Rate (breaths/min): 17 Weight (lbs): 167 Blood Pressure (mmHg): 149/80 Source: Measured Reference Range: 80 - 120 mg / dl Body Mass Index (BMI): 23.3 Electronic Signature(s) Signed: 02/14/2016 2:05:33 PM By: Elpidio Eric BSN, RN Entered By: Elpidio Eric on 02/14/2016 08:03:55

## 2016-02-14 NOTE — Progress Notes (Signed)
SWAN, ZAYED ([id number]) Visit Report for 02/14/2016 Abuse/Suicide Risk Screen Details Patient Name: Keith Guzman, Keith Guzman. Date of Service: 02/14/2016 8:00 AM Medical Record Number: [id number] Patient Account Number: [id number] Date of Birth/Sex: Jan 19, 1961 (55 y.o. Male) Treating RN: Clover Mealy, RN, BSN, [place] [employer] Primary Care Physician: Farris Has Other Clinician: Referring Physician: Farris Has Treating Physician/Extender: Rudene Re in Treatment: 0 Abuse/Suicide Risk Screen Items Answer ABUSE/SUICIDE RISK SCREEN: Has anyone close to you tried to hurt or harm you recentlyo No Do you feel uncomfortable with anyone in your familyo No Has anyone forced you do things that you didnot want to doo No Do you have any thoughts of harming yourselfo No Patient displays signs or symptoms of abuse and/or neglect. No Electronic Signature(s) Signed: 02/14/2016 7:56:04 AM By: Elpidio Eric BSN, RN Entered By: Elpidio Eric on 02/14/2016 07:56:04 Keith Guzman, Keith Guzman ([id number]) -------------------------------------------------------------------------------- Activities of Daily Living Details Patient Name: Keith Guzman, Keith Guzman. Date of Service: 02/14/2016 8:00 AM Medical Record Number: [id number] Patient Account Number: [id number] Date of Birth/Sex: 05-03-1961 (55 y.o. Male) Treating RN: Clover Mealy, RN, BSN, [place] [employer] Primary Care Physician: Farris Has Other Clinician: Referring Physician: Farris Has Treating Physician/Extender: Rudene Re in Treatment: 0 Activities of Daily Living Items Answer Activities of Daily Living (Please select one for each item) Drive Automobile Completely Able Take Medications Completely Able Use Telephone Completely Able Care for Appearance Completely Able Use Toilet Completely Able Bath / Shower Completely Able Dress Self Completely Able Feed Self Completely Able Walk Completely Able Get In / Out Bed Completely Able Housework Completely Able Prepare Meals Completely  Able Handle Money Completely Able Shop for Self Completely Able Electronic Signature(s) Signed: 02/14/2016 7:54:43 AM By: Elpidio Eric BSN, RN Entered By: Elpidio Eric on 02/14/2016 07:54:43 Keith Guzman, Keith Guzman ([id number]) -------------------------------------------------------------------------------- Education Assessment Details Patient Name: Keith Guzman. Date of Service: 02/14/2016 8:00 AM Medical Record Number: [id number] Patient Account Number: [id number] Date of Birth/Sex: 1960-12-01 (55 y.o. Male) Treating RN: Clover Mealy, RN, BSN, [place] [employer] Primary Care Physician: Farris Has Other Clinician: Referring Physician: Farris Has Treating Physician/Extender: Rudene Re in Treatment: 0 Primary Learner Assessed: Patient Learning Preferences/Education Level/Primary Language Learning Preference: Explanation Highest Education Level: College or Above Preferred Language: English Cognitive Barrier Assessment/Beliefs Language Barrier: No Physical Barrier Assessment Impaired Vision: No Impaired Hearing: No Decreased Hand dexterity: No Knowledge/Comprehension Assessment Knowledge Level: High Comprehension Level: High Ability to understand written High instructions: Ability to understand verbal High instructions: Motivation Assessment Anxiety Level: Calm Cooperation: Cooperative Education Importance: Acknowledges Need Interest in Health Problems: Asks Questions Perception: Coherent Willingness to Engage in Self- High Management Activities: Readiness to Engage in Self- High Management Activities: Electronic Signature(s) Signed: 02/14/2016 7:55:12 AM By: Elpidio Eric BSN, RN Entered By: Elpidio Eric on 02/14/2016 07:55:12 Keith Guzman, Keith Guzman ([id number]) -------------------------------------------------------------------------------- Fall Risk Assessment Details Patient Name: Keith Guzman. Date of Service: 02/14/2016 8:00 AM Medical Record Number: [id number] Patient Account Number:  [id number] Date of Birth/Sex: 1961/09/23 (55 y.o. Male) Treating RN: Clover Mealy, RN, BSN, [place] Primary Care Physician: Farris Has Other Clinician: Referring Physician: Farris Has Treating Physician/Extender: Rudene Re in Treatment: 0 Fall Risk Assessment Items Have you had 2 or more falls in the last 12 monthso 0 No Have you had any fall that resulted in injury in the last 12 monthso 0 No FALL RISK ASSESSMENT: History of falling - immediate or within 3 months 0 No Secondary diagnosis 0 No Ambulatory aid None/bed rest/wheelchair/nurse 0 Yes Crutches/cane/walker 0 No Furniture 0 No IV Access/Saline Lock 0  No Gait/Training Normal/bed rest/immobile 0 Yes Weak 0 No Impaired 0 No Mental Status Oriented to own ability 0 Yes Electronic Signature(s) Signed: 02/14/2016 7:55:52 AM By: Elpidio Eric BSN, RN Entered By: Elpidio Eric on 02/14/2016 07:55:52 Keith Guzman, Keith Guzman ([id number]) -------------------------------------------------------------------------------- Foot Assessment Details Patient Name: Keith Guzman. Date of Service: 02/14/2016 8:00 AM Medical Record Number: [id number] Patient Account Number: [id number] Date of Birth/Sex: Oct 22, 1960 (55 y.o. Male) Treating RN: Clover Mealy, RN, BSN, [place] [employer] Primary Care Physician: Farris Has Other Clinician: Referring Physician: Farris Has Treating Physician/Extender: Rudene Re in Treatment: 0 Foot Assessment Items Site Locations + = Sensation present, - = Sensation absent, C = Callus, U = Ulcer R = Redness, W = Warmth, M = Maceration, PU = Pre-ulcerative lesion F = Fissure, S = Swelling, D = Dryness Assessment Right: Left: Other Deformity: No No Prior Foot Ulcer: No No Prior Amputation: No No Charcot Joint: No No Ambulatory Status: Ambulatory Without Help Gait: Steady Electronic Signature(s) Signed: 02/14/2016 7:55:32 AM By: Elpidio Eric BSN, RN Entered By: Elpidio Eric on 02/14/2016 07:55:32 Keith Guzman, Keith Guzman  ([id number]) -------------------------------------------------------------------------------- Nutrition Risk Assessment Details Patient Name: Keith Guzman. Date of Service: 02/14/2016 8:00 AM Medical Record Number: [id number] Patient Account Number: [id number] Date of Birth/Sex: [birth year] (55 y.o. Male) Treating RN: Clover Mealy, RN, BSN, [place] Primary Care Physician: Farris Has Other Clinician: Referring Physician: Farris Has Treating Physician/Extender: Rudene Re in Treatment: 0 Height (in): 68 Weight (lbs): 171 Body Mass Index (BMI): 26 Nutrition Risk Assessment Items NUTRITION RISK SCREEN: I have an illness or condition that made me change the kind and/or 0 No amount of food I eat I eat fewer than two meals per day 0 No I eat few fruits and vegetables, or milk products 0 No I have three or more drinks of beer, liquor or wine almost every day 0 No I have tooth or mouth problems that make it hard for me to eat 0 No I don't always have enough money to buy the food I need 0 No I eat alone most of the time 0 No I take three or more different prescribed or over-the-counter drugs a 0 No day Without wanting to, I have lost or gained 10 pounds in the last six 0 No months I am not always physically able to shop, cook and/or feed myself 0 No Nutrition Protocols Good Risk Protocol 0 No interventions needed Moderate Risk Protocol Electronic Signature(s) Signed: 02/14/2016 7:55:22 AM By: Elpidio Eric BSN, RN Entered By: Elpidio Eric on 02/14/2016 07:55:22
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
## 2016-02-21 ENCOUNTER — Encounter: Payer: 59 | Admitting: Surgery

## 2016-02-21 DIAGNOSIS — E1140 Type 2 diabetes mellitus with diabetic neuropathy, unspecified: Secondary | ICD-10-CM | POA: Diagnosis not present

## 2016-02-21 DIAGNOSIS — T25221A Burn of second degree of right foot, initial encounter: Secondary | ICD-10-CM | POA: Diagnosis not present

## 2016-02-21 DIAGNOSIS — Z09 Encounter for follow-up examination after completed treatment for conditions other than malignant neoplasm: Secondary | ICD-10-CM | POA: Diagnosis not present

## 2016-02-21 DIAGNOSIS — I420 Dilated cardiomyopathy: Secondary | ICD-10-CM | POA: Diagnosis not present

## 2016-02-21 DIAGNOSIS — I10 Essential (primary) hypertension: Secondary | ICD-10-CM | POA: Diagnosis not present

## 2016-02-21 DIAGNOSIS — L97512 Non-pressure chronic ulcer of other part of right foot with fat layer exposed: Secondary | ICD-10-CM | POA: Diagnosis not present

## 2016-02-21 DIAGNOSIS — Z8631 Personal history of diabetic foot ulcer: Secondary | ICD-10-CM | POA: Diagnosis not present

## 2016-02-21 DIAGNOSIS — E11621 Type 2 diabetes mellitus with foot ulcer: Secondary | ICD-10-CM | POA: Diagnosis not present
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
## 2016-02-21 NOTE — Progress Notes (Signed)
Keith, Guzman ([id number]) Visit Report for 02/21/2016 Arrival Information Details Patient Name: Keith Guzman, Keith Guzman. Date of Service: 02/21/2016 1:30 PM Medical Record Number: [id number] Patient Account Number: [id number] Date of Birth/Sex: [birth year] (55 y.o. Male) Treating RN: Clover Mealy, RN, BSN, [place] Sink Primary Care Physician: Farris Has Other Clinician: Referring Physician: Farris Has Treating Physician/Extender: Rudene Re in Treatment: 1 Visit Information History Since Last Visit Added or deleted any medications: No Patient Arrived: Ambulatory Any new allergies or adverse reactions: No Arrival Time: 13:32 Had a fall or experienced change in No Accompanied By: wife activities of daily living that may affect Transfer Assistance: None risk of falls: Patient Identification Verified: Yes Signs or symptoms of abuse/neglect since last No Secondary Verification Process Yes visito Completed: Hospitalized since last visit: No Patient Requires Transmission- No Has Dressing in Place as Prescribed: Yes Based Precautions: Pain Present Now: No Patient Has Alerts: Yes Patient Alerts: ABI(08/2015)L:0.83 R:0.89 Electronic Signature(s) Signed: 02/21/2016 3:19:20 PM By: Elpidio Eric BSN, RN Entered By: Elpidio Eric on 02/21/2016 13:33:14 [place], Keith Guzman ([id number]) -------------------------------------------------------------------------------- Clinic Level of Care Assessment Details Patient Name: Keith Mast. Date of Service: 02/21/2016 1:30 PM Medical Record Number: [id number] Patient Account Number: [id number] Date of Birth/Sex: 10-Apr-1961 (55 y.o. Male) Treating RN: Clover Mealy, RN, BSN, [place] Primary Care Physician: Farris Has Other Clinician: Referring Physician: Farris Has Treating Physician/Extender: Rudene Re in Treatment: 1 Clinic Level of Care Assessment Items TOOL 4 Quantity Score []  - Use when only an EandM is performed on FOLLOW-UP visit 0 ASSESSMENTS - Nursing  Assessment / Reassessment X - Reassessment of Co-morbidities (includes updates in patient status) 1 10 X - Reassessment of Adherence to Treatment Plan 1 5 ASSESSMENTS - Wound and Skin Assessment / Reassessment X - Simple Wound Assessment / Reassessment - one wound 1 5 []  - Complex Wound Assessment / Reassessment - multiple wounds 0 []  - Dermatologic / Skin Assessment (not related to wound area) 0 ASSESSMENTS - Focused Assessment []  - Circumferential Edema Measurements - multi extremities 0 []  - Nutritional Assessment / Counseling / Intervention 0 []  - Lower Extremity Assessment (monofilament, tuning fork, pulses) 0 []  - Peripheral Arterial Disease Assessment (using hand held doppler) 0 ASSESSMENTS - Ostomy and/or Continence Assessment and Care []  - Incontinence Assessment and Management 0 []  - Ostomy Care Assessment and Management (repouching, etc.) 0 PROCESS - Coordination of Care X - Simple Patient / Family Education for ongoing care 1 15 []  - Complex (extensive) Patient / Family Education for ongoing care 0 []  - Staff obtains Chiropractor, Records, Test Results / Process Orders 0 []  - Staff telephones HHA, Nursing Homes / Clarify orders / etc 0 []  - Routine Transfer to another Facility (non-emergent condition) 0 Daversa, Elven M. ([id number]) []  - Routine Hospital Admission (non-emergent condition) 0 []  - New Admissions / Manufacturing engineer / Ordering NPWT, Apligraf, etc. 0 []  - Emergency Hospital Admission (emergent condition) 0 []  - Simple Discharge Coordination 0 []  - Complex (extensive) Discharge Coordination 0 PROCESS - Special Needs []  - Pediatric / Minor Patient Management 0 []  - Isolation Patient Management 0 []  - Hearing / Language / Visual special needs 0 []  - Assessment of Community assistance (transportation, D/C planning, etc.) 0 []  - Additional assistance / Altered mentation 0 []  - Support Surface(s) Assessment (bed, cushion, seat, etc.) 0 INTERVENTIONS - Wound  Cleansing / Measurement X - Simple Wound Cleansing - one wound 1 5 []  - Complex Wound Cleansing - multiple wounds 0 X - Wound Imaging (photographs -  any number of wounds) 1 5 []  - Wound Tracing (instead of photographs) 0 X - Simple Wound Measurement - one wound 1 5 []  - Complex Wound Measurement - multiple wounds 0 INTERVENTIONS - Wound Dressings X - Small Wound Dressing one or multiple wounds 1 10 []  - Medium Wound Dressing one or multiple wounds 0 []  - Large Wound Dressing one or multiple wounds 0 []  - Application of Medications - topical 0 []  - Application of Medications - injection 0 INTERVENTIONS - Miscellaneous []  - External ear exam 0 Perkin, Duncan M. ([id number]) []  - Specimen Collection (cultures, biopsies, blood, body fluids, etc.) 0 []  - Specimen(s) / Culture(s) sent or taken to Lab for analysis 0 []  - Patient Transfer (multiple staff / Michiel Sites Lift / Similar devices) 0 []  - Simple Staple / Suture removal (25 or less) 0 []  - Complex Staple / Suture removal (26 or more) 0 []  - Hypo / Hyperglycemic Management (close monitor of Blood Glucose) 0 []  - Ankle / Brachial Index (ABI) - do not check if billed separately 0 X - Vital Signs 1 5 Has the patient been seen at the hospital within the last three years: Yes Total Score: 65 Level Of Care: New/Established - Level 2 Electronic Signature(s) Signed: 02/21/2016 3:19:20 PM By: Elpidio Eric BSN, RN Entered By: Elpidio Eric on 02/21/2016 13:58:29 Ra, Keith Guzman ([id number]) -------------------------------------------------------------------------------- Encounter Discharge Information Details Patient Name: Keith Mast. Date of Service: 02/21/2016 1:30 PM Medical Record Number: [id number] Patient Account Number: [id number] Date of Birth/Sex: 04-03-1961 (55 y.o. Male) Treating RN: Clover Mealy, RN, BSN, [place] Sink Primary Care Physician: Farris Has Other Clinician: Referring Physician: Farris Has Treating Physician/Extender: Rudene Re  in Treatment: 1 Encounter Discharge Information Items Discharge Pain Level: 0 Discharge Condition: Stable Ambulatory Status: Ambulatory Discharge Destination: Home Transportation: Private Auto Accompanied By: wife Schedule Follow-up Appointment: No Medication Reconciliation completed and provided to Patient/Care No Kalese Ensz: Provided on Clinical Summary of Care: 02/21/2016 Form Type Recipient Paper Patient LL Electronic Signature(s) Signed: 02/21/2016 3:19:20 PM By: Elpidio Eric BSN, RN Previous Signature: 02/21/2016 1:56:48 PM Version By: Gwenlyn Perking Entered By: Elpidio Eric on 02/21/2016 13:59:14 [place], Keith Guzman ([id number]) -------------------------------------------------------------------------------- Lower Extremity Assessment Details Patient Name: Keith Mast. Date of Service: 02/21/2016 1:30 PM Medical Record Number: [id number] Patient Account Number: [id number] Date of Birth/Sex: 08-24-1961 (55 y.o. Male) Treating RN: Clover Mealy, RN, BSN, [place] Sink Primary Care Physician: Farris Has Other Clinician: Referring Physician: Farris Has Treating Physician/Extender: Rudene Re in Treatment: 1 Vascular Assessment Pulses: Posterior Tibial Dorsalis Pedis Palpable: [Right:Yes] Extremity colors, hair growth, and conditions: Extremity Color: [Right:Normal] Hair Growth on Extremity: [Right:Yes] Temperature of Extremity: [Right:Warm] Electronic Signature(s) Signed: 02/21/2016 3:19:20 PM By: Elpidio Eric BSN, RN Entered By: Elpidio Eric on 02/21/2016 13:51:40 Glandon, Keith Guzman ([id number]) -------------------------------------------------------------------------------- Multi Wound Chart Details Patient Name: Keith Mast. Date of Service: 02/21/2016 1:30 PM Medical Record Number: [id number] Patient Account Number: [id number] Date of Birth/Sex: [birth year] (55 y.o. Male) Treating RN: Clover Mealy, RN, BSN, [place] Sink Primary Care Physician: Farris Has Other Clinician: Referring Physician:  Farris Has Treating Physician/Extender: Rudene Re in Treatment: 1 Vital Signs Height(in): 71 Pulse(bpm): 81 Weight(lbs): 167 Blood Pressure 159/81 (mmHg): Body Mass Index(BMI): 23 Temperature(F): 98.3 Respiratory Rate 17 (breaths/min): Photos: [2:No Photos] [3:No Photos] [N/A:N/A] Wound Location: [2:Toe Second - Medial] [3:Right Toe Fifth - Lateral] [N/A:N/A] Wounding Event: [2:Gradually Appeared] [3:Gradually Appeared] [N/A:N/A] Primary Etiology: [2:Diabetic Wound/Ulcer of the Lower Extremity] [3:Diabetic Wound/Ulcer of the Lower Extremity] [N/A:N/A] Comorbid History: [2:Hypertension, Type  II Diabetes, Neuropathy] [3:Hypertension, Type II Diabetes, Neuropathy] [N/A:N/A] Date Acquired: [2:01/14/2016] [3:01/14/2016] [N/A:N/A] Weeks of Treatment: [2:1] [3:1] [N/A:N/A] Wound Status: [2:Healed - Epithelialized] [3:Healed - Epithelialized] [N/A:N/A] Measurements L x W x D 0x0x0 [3:0x0x0] [N/A:N/A] (cm) Area (cm) : [2:0] [3:0] [N/A:N/A] Volume (cm) : [2:0] [3:0] [N/A:N/A] % Reduction in Area: [2:100.00%] [3:100.00%] [N/A:N/A] % Reduction in Volume: 100.00% [3:100.00%] [N/A:N/A] Classification: [2:Grade 0] [3:Grade 0] [N/A:N/A] Exudate Amount: [2:None Present] [3:None Present] [N/A:N/A] Wound Margin: [2:Distinct, outline attached] [3:Distinct, outline attached] [N/A:N/A] Granulation Amount: [2:None Present (0%)] [3:Large (67-100%)] [N/A:N/A] Necrotic Amount: [2:None Present (0%)] [3:None Present (0%)] [N/A:N/A] Exposed Structures: [2:Fascia: No Fat: No Tendon: No Muscle: No Joint: No Bone: No Limited to Skin Breakdown] [3:Fascia: No Fat: No Tendon: No Muscle: No Joint: No Bone: No Limited to Skin Breakdown] [N/A:N/A] Epithelialization: [2:Large (67-100%)] [3:None] [N/A:N/A] Periwound Skin Texture: Callus: Yes Edema: No N/A Edema: No Excoriation: No Excoriation: No Induration: No Induration: No Callus: No Crepitus: No Crepitus: No Fluctuance: No Fluctuance:  No Friable: No Friable: No Rash: No Rash: No Scarring: No Scarring: No Periwound Skin Dry/Scaly: Yes Maceration: No N/A Moisture: Maceration: No Moist: No Moist: No Dry/Scaly: No Periwound Skin Color: Atrophie Blanche: No Atrophie Blanche: No N/A Cyanosis: No Cyanosis: No Ecchymosis: No Ecchymosis: No Erythema: No Erythema: No Hemosiderin Staining: No Hemosiderin Staining: No Mottled: No Mottled: No Pallor: No Pallor: No Rubor: No Rubor: No Temperature: No Abnormality No Abnormality N/A Tenderness on No No N/A Palpation: Wound Preparation: Ulcer Cleansing: Ulcer Cleansing: N/A Rinsed/Irrigated with Rinsed/Irrigated with Saline Saline Topical Anesthetic Topical Anesthetic Applied: None Applied: None Treatment Notes Electronic Signature(s) Signed: 02/21/2016 3:19:20 PM By: Elpidio Eric BSN, RN Entered By: Elpidio Eric on 02/21/2016 13:58:01 Guttierrez, Keith Guzman ([id number]) -------------------------------------------------------------------------------- Multi-Disciplinary Care Plan Details Patient Name: Keith Mast. Date of Service: 02/21/2016 1:30 PM Medical Record Number: [id number] Patient Account Number: [id number] Date of Birth/Sex: 02-14-1961 (56 y.o. Male) Treating RN: Clover Mealy, RN, BSN, [place] Sink Primary Care Physician: Farris Has Other Clinician: Referring Physician: Farris Has Treating Physician/Extender: Rudene Re in Treatment: 1 Active Inactive Electronic Signature(s) Signed: 02/21/2016 3:19:20 PM By: Elpidio Eric BSN, RN Entered By: Elpidio Eric on 02/21/2016 13:57:00 Hinsley, Keith Guzman ([id number]) -------------------------------------------------------------------------------- Pain Assessment Details Patient Name: Keith Mast. Date of Service: 02/21/2016 1:30 PM Medical Record Number: [id number] Patient Account Number: [id number] Date of Birth/Sex: 1960-10-09 (55 y.o. Male) Treating RN: Clover Mealy, RN, BSN, [place] Sink Primary Care Physician: Farris Has Other  Clinician: Referring Physician: Farris Has Treating Physician/Extender: Rudene Re in Treatment: 1 Active Problems Location of Pain Severity and Description of Pain Patient Has Paino No Site Locations With Dressing Change: No Pain Management and Medication Current Pain Management: Electronic Signature(s) Signed: 02/21/2016 3:19:20 PM By: Elpidio Eric BSN, RN Entered By: Elpidio Eric on 02/21/2016 13:33:22 Fenderson, Keith Guzman ([id number]) -------------------------------------------------------------------------------- Patient/Caregiver Education Details Patient Name: Keith Mast. Date of Service: 02/21/2016 1:30 PM Medical Record Number: [id number] Patient Account Number: [id number] Date of Birth/Gender: Jul 26, 1961 (55 y.o. Male) Treating RN: Clover Mealy, RN, BSN, [place] Sink Primary Care Physician: Farris Has Other Clinician: Referring Physician: Farris Has Treating Physician/Extender: Rudene Re in Treatment: 1 Education Assessment Education Provided To: Patient Education Topics Provided Basic Hygiene: Methods: Explain/Verbal Responses: State content correctly Electronic Signature(s) Signed: 02/21/2016 3:19:20 PM By: Elpidio Eric BSN, RN Entered By: Elpidio Eric on 02/21/2016 13:59:25 [place], Keith Guzman ([id number]) -------------------------------------------------------------------------------- Wound Assessment Details Patient Name: Keith Mast. Date of Service: 02/21/2016 1:30 PM Medical Record Number: [id number] Patient Account Number: [id number]  Date of Birth/Sex: 1961/03/04 (55 y.o. Male) Treating RN: Clover Mealy, RN, BSN, [place] Sink Primary Care Physician: Farris Has Other Clinician: Referring Physician: Farris Has Treating Physician/Extender: Rudene Re in Treatment: 1 Wound Status Wound Number: 2 Primary Diabetic Wound/Ulcer of the Lower Etiology: Extremity Wound Location: Toe Second - Medial Wound Status: Healed - Epithelialized Wounding Event: Gradually  Appeared Comorbid Hypertension, Type II Diabetes, Date Acquired: 01/14/2016 History: Neuropathy Weeks Of Treatment: 1 Clustered Wound: No Photos Photo Uploaded By: Elpidio Eric on 02/21/2016 15:15:33 Wound Measurements Length: (cm) 0 % Reductio Width: (cm) 0 % Reductio Depth: (cm) 0 Epithelial Area: (cm) 0 Tunneling Volume: (cm) 0 Undermini n in Area: 100% n in Volume: 100% ization: Large (67-100%) : No ng: No Wound Description Classification: Grade 0 Wound Margin: Distinct, outline attached Exudate Amount: None Present Foul Odor After Cleansing: No Wound Bed Granulation Amount: None Present (0%) Exposed Structure Necrotic Amount: None Present (0%) Fascia Exposed: No Fat Layer Exposed: No Tendon Exposed: No Muscle Exposed: No Joint Exposed: No Lau, Neely M. ([id number]) Bone Exposed: No Limited to Skin Breakdown Periwound Skin Texture Texture Color No Abnormalities Noted: No No Abnormalities Noted: No Callus: Yes Atrophie Blanche: No Crepitus: No Cyanosis: No Excoriation: No Ecchymosis: No Fluctuance: No Erythema: No Friable: No Hemosiderin Staining: No Induration: No Mottled: No Localized Edema: No Pallor: No Rash: No Rubor: No Scarring: No Temperature / Pain Moisture Temperature: No Abnormality No Abnormalities Noted: No Dry / Scaly: Yes Maceration: No Moist: No Wound Preparation Ulcer Cleansing: Rinsed/Irrigated with Saline Topical Anesthetic Applied: None Electronic Signature(s) Signed: 02/21/2016 3:19:20 PM By: Elpidio Eric BSN, RN Entered By: Elpidio Eric on 02/21/2016 13:57:41 Motz, Keith Guzman ([id number]) -------------------------------------------------------------------------------- Wound Assessment Details Patient Name: Keith Mast. Date of Service: 02/21/2016 1:30 PM Medical Record Number: [id number] Patient Account Number: [id number] Date of Birth/Sex: 1961/06/22 (55 y.o. Male) Treating RN: Clover Mealy, RN, BSN, [place] Primary Care Physician:  Farris Has Other Clinician: Referring Physician: Farris Has Treating Physician/Extender: Rudene Re in Treatment: 1 Wound Status Wound Number: 3 Primary Diabetic Wound/Ulcer of the Lower Etiology: Extremity Wound Location: Right Toe Fifth - Lateral Wound Status: Healed - Epithelialized Wounding Event: Gradually Appeared Comorbid Hypertension, Type II Diabetes, Date Acquired: 01/14/2016 History: Neuropathy Weeks Of Treatment: 1 Clustered Wound: No Photos Photo Uploaded By: Elpidio Eric on 02/21/2016 15:15:34 Wound Measurements Length: (cm) 0 % Reductio Width: (cm) 0 % Reductio Depth: (cm) 0 Epithelial Area: (cm) 0 Tunneling Volume: (cm) 0 Undermini n in Area: 100% n in Volume: 100% ization: None : No ng: No Wound Description Classification: Grade 0 Wound Margin: Distinct, outline attached Exudate Amount: None Present Foul Odor After Cleansing: No Wound Bed Granulation Amount: Large (67-100%) Exposed Structure Necrotic Amount: None Present (0%) Fascia Exposed: No Fat Layer Exposed: No Tendon Exposed: No Muscle Exposed: No Joint Exposed: No Mannina, Khalid M. ([id number]) Bone Exposed: No Limited to Skin Breakdown Periwound Skin Texture Texture Color No Abnormalities Noted: No No Abnormalities Noted: No Callus: No Atrophie Blanche: No Crepitus: No Cyanosis: No Excoriation: No Ecchymosis: No Fluctuance: No Erythema: No Friable: No Hemosiderin Staining: No Induration: No Mottled: No Localized Edema: No Pallor: No Rash: No Rubor: No Scarring: No Temperature / Pain Moisture Temperature: No Abnormality No Abnormalities Noted: No Dry / Scaly: No Maceration: No Moist: No Wound Preparation Ulcer Cleansing: Rinsed/Irrigated with Saline Topical Anesthetic Applied: None Electronic Signature(s) Signed: 02/21/2016 3:19:20 PM By: Elpidio Eric BSN, RN Entered By: Elpidio Eric on 02/21/2016 13:41:50 Kopf, Keith Guzman  ([id number]) --------------------------------------------------------------------------------  Vitals Details Patient Name: PRABHDEEP, KIRKER. Date of Service: 02/21/2016 1:30 PM Medical Record Number: [id number] Patient Account Number: [id number] Date of Birth/Sex: 03-22-1961 (55 y.o. Male) Treating RN: Clover Mealy, RN, BSN, [place] Primary Care Physician: Farris Has Other Clinician: Referring Physician: Farris Has Treating Physician/Extender: Rudene Re in Treatment: 1 Vital Signs Time Taken: 13:37 Temperature (F): 98.3 Height (in): 71 Pulse (bpm): 81 Weight (lbs): 167 Respiratory Rate (breaths/min): 17 Body Mass Index (BMI): 23.3 Blood Pressure (mmHg): 159/81 Reference Range: 80 - 120 mg / dl Electronic Signature(s) Signed: 02/21/2016 3:19:20 PM By: Elpidio Eric BSN, RN Entered By: Elpidio Eric on 02/21/2016 13:37:31

## 2016-02-25 DIAGNOSIS — H3582 Retinal ischemia: Secondary | ICD-10-CM | POA: Diagnosis not present

## 2016-02-25 DIAGNOSIS — H35373 Puckering of macula, bilateral: Secondary | ICD-10-CM | POA: Diagnosis not present

## 2016-02-25 DIAGNOSIS — E113593 Type 2 diabetes mellitus with proliferative diabetic retinopathy without macular edema, bilateral: Secondary | ICD-10-CM | POA: Diagnosis not present

## 2016-02-28 ENCOUNTER — Ambulatory Visit: Payer: 59 | Admitting: Surgery

## 2016-03-10 ENCOUNTER — Other Ambulatory Visit (HOSPITAL_BASED_OUTPATIENT_CLINIC_OR_DEPARTMENT_OTHER): Payer: 59

## 2016-03-10 ENCOUNTER — Ambulatory Visit: Payer: 59

## 2016-03-10 DIAGNOSIS — D631 Anemia in chronic kidney disease: Secondary | ICD-10-CM

## 2016-03-10 DIAGNOSIS — D638 Anemia in other chronic diseases classified elsewhere: Secondary | ICD-10-CM

## 2016-03-10 DIAGNOSIS — N189 Chronic kidney disease, unspecified: Secondary | ICD-10-CM | POA: Diagnosis not present

## 2016-03-10 LAB — COMPREHENSIVE METABOLIC PANEL
ALBUMIN: 3.3 g/dL — AB (ref 3.5–5.0)
ALK PHOS: 97 U/L (ref 40–150)
ALT: 36 U/L (ref 0–55)
AST: 20 U/L (ref 5–34)
Anion Gap: 7 mEq/L (ref 3–11)
BUN: 24.3 mg/dL (ref 7.0–26.0)
CALCIUM: 9.2 mg/dL (ref 8.4–10.4)
CHLORIDE: 105 meq/L (ref 98–109)
CO2: 25 mEq/L (ref 22–29)
CREATININE: 1.8 mg/dL — AB (ref 0.7–1.3)
EGFR: 47 mL/min/{1.73_m2} — ABNORMAL LOW (ref >90)
GLUCOSE: 237 mg/dL — AB (ref 70–140)
Potassium: 5.1 mEq/L (ref 3.5–5.1)
SODIUM: 137 meq/L (ref 136–145)
Total Bilirubin: 0.33 mg/dL (ref 0.20–1.20)
Total Protein: 7.4 g/dL (ref 6.4–8.3)

## 2016-03-10 LAB — CBC WITH DIFFERENTIAL/PLATELET
BASO%: 0.3 % (ref 0.0–2.0)
Basophils Absolute: 0 10*3/uL (ref 0.0–0.1)
EOS ABS: 0.3 10*3/uL (ref 0.0–0.5)
EOS%: 3.5 % (ref 0.0–7.0)
HCT: 37.3 % — ABNORMAL LOW (ref 38.4–49.9)
HGB: 11.3 g/dL — ABNORMAL LOW (ref 13.0–17.1)
LYMPH%: 16.8 % (ref 14.0–49.0)
MCH: 22.9 pg — ABNORMAL LOW (ref 27.2–33.4)
MCHC: 30.2 g/dL — AB (ref 32.0–36.0)
MCV: 75.8 fL — AB (ref 79.3–98.0)
MONO#: 0.6 10*3/uL (ref 0.1–0.9)
MONO%: 8.7 % (ref 0.0–14.0)
NEUT%: 70.7 % (ref 39.0–75.0)
NEUTROS ABS: 5 10*3/uL (ref 1.5–6.5)
PLATELETS: 261 10*3/uL (ref 140–400)
RBC: 4.92 10*6/uL (ref 4.20–5.82)
RDW: 16.8 % — ABNORMAL HIGH (ref 11.0–14.6)
WBC: 7.1 10*3/uL (ref 4.0–10.3)
lymph#: 1.2 10*3/uL (ref 0.9–3.3)

## 2016-03-10 NOTE — Progress Notes (Signed)
Pt's Hgb is at 11.3 today. No injection needed per protocol, Pt given current schedule and instructed to keep current appointments and call for issues.

## 2016-03-16 DIAGNOSIS — I420 Dilated cardiomyopathy: Secondary | ICD-10-CM | POA: Diagnosis not present

## 2016-03-16 DIAGNOSIS — N183 Chronic kidney disease, stage 3 (moderate): Secondary | ICD-10-CM | POA: Diagnosis not present

## 2016-03-16 DIAGNOSIS — E11319 Type 2 diabetes mellitus with unspecified diabetic retinopathy without macular edema: Secondary | ICD-10-CM | POA: Diagnosis not present

## 2016-03-16 DIAGNOSIS — E785 Hyperlipidemia, unspecified: Secondary | ICD-10-CM | POA: Diagnosis not present

## 2016-03-16 DIAGNOSIS — Z7984 Long term (current) use of oral hypoglycemic drugs: Secondary | ICD-10-CM | POA: Diagnosis not present

## 2016-03-16 DIAGNOSIS — Z89432 Acquired absence of left foot: Secondary | ICD-10-CM | POA: Diagnosis not present

## 2016-03-16 DIAGNOSIS — E1140 Type 2 diabetes mellitus with diabetic neuropathy, unspecified: Secondary | ICD-10-CM | POA: Diagnosis not present

## 2016-03-16 DIAGNOSIS — D649 Anemia, unspecified: Secondary | ICD-10-CM | POA: Diagnosis not present

## 2016-03-18 ENCOUNTER — Other Ambulatory Visit: Payer: Self-pay | Admitting: Cardiology

## 2016-03-19 NOTE — Telephone Encounter (Signed)
Rx(s) sent to pharmacy electronically.  

## 2016-04-07 ENCOUNTER — Ambulatory Visit: Payer: 59 | Admitting: Hematology

## 2016-04-07 ENCOUNTER — Ambulatory Visit: Payer: 59

## 2016-04-07 ENCOUNTER — Other Ambulatory Visit: Payer: 59

## 2016-04-08 ENCOUNTER — Other Ambulatory Visit: Payer: Self-pay | Admitting: Cardiology

## 2016-04-21 ENCOUNTER — Other Ambulatory Visit: Payer: Self-pay | Admitting: Hematology

## 2016-04-27 DIAGNOSIS — E1142 Type 2 diabetes mellitus with diabetic polyneuropathy: Secondary | ICD-10-CM | POA: Diagnosis not present

## 2016-04-27 DIAGNOSIS — Z89432 Acquired absence of left foot: Secondary | ICD-10-CM | POA: Diagnosis not present

## 2016-04-27 DIAGNOSIS — L97421 Non-pressure chronic ulcer of left heel and midfoot limited to breakdown of skin: Secondary | ICD-10-CM | POA: Diagnosis not present

## 2016-05-05 ENCOUNTER — Other Ambulatory Visit (HOSPITAL_BASED_OUTPATIENT_CLINIC_OR_DEPARTMENT_OTHER): Payer: 59

## 2016-05-05 ENCOUNTER — Encounter: Payer: Self-pay | Admitting: Hematology

## 2016-05-05 ENCOUNTER — Telehealth: Payer: Self-pay | Admitting: Hematology

## 2016-05-05 ENCOUNTER — Ambulatory Visit (HOSPITAL_BASED_OUTPATIENT_CLINIC_OR_DEPARTMENT_OTHER): Payer: 59 | Admitting: Hematology

## 2016-05-05 ENCOUNTER — Ambulatory Visit (HOSPITAL_BASED_OUTPATIENT_CLINIC_OR_DEPARTMENT_OTHER): Payer: 59

## 2016-05-05 VITALS — BP 165/90 | HR 81 | Temp 98.2°F | Resp 17 | Ht 71.5 in | Wt 164.9 lb

## 2016-05-05 DIAGNOSIS — E119 Type 2 diabetes mellitus without complications: Secondary | ICD-10-CM | POA: Diagnosis not present

## 2016-05-05 DIAGNOSIS — D6489 Other specified anemias: Secondary | ICD-10-CM | POA: Diagnosis not present

## 2016-05-05 DIAGNOSIS — N189 Chronic kidney disease, unspecified: Secondary | ICD-10-CM

## 2016-05-05 DIAGNOSIS — I10 Essential (primary) hypertension: Secondary | ICD-10-CM

## 2016-05-05 DIAGNOSIS — D638 Anemia in other chronic diseases classified elsewhere: Secondary | ICD-10-CM

## 2016-05-05 DIAGNOSIS — D631 Anemia in chronic kidney disease: Secondary | ICD-10-CM | POA: Diagnosis not present

## 2016-05-05 DIAGNOSIS — Z87891 Personal history of nicotine dependence: Secondary | ICD-10-CM

## 2016-05-05 LAB — CBC WITH DIFFERENTIAL/PLATELET
BASO%: 0.4 % (ref 0.0–2.0)
Basophils Absolute: 0 10*3/uL (ref 0.0–0.1)
EOS ABS: 0.3 10*3/uL (ref 0.0–0.5)
EOS%: 3.8 % (ref 0.0–7.0)
HCT: 29.3 % — ABNORMAL LOW (ref 38.4–49.9)
HGB: 9.1 g/dL — ABNORMAL LOW (ref 13.0–17.1)
LYMPH%: 21.3 % (ref 14.0–49.0)
MCH: 23.5 pg — AB (ref 27.2–33.4)
MCHC: 31 g/dL — AB (ref 32.0–36.0)
MCV: 75.7 fL — AB (ref 79.3–98.0)
MONO#: 0.6 10*3/uL (ref 0.1–0.9)
MONO%: 8.4 % (ref 0.0–14.0)
NEUT#: 5.1 10*3/uL (ref 1.5–6.5)
NEUT%: 66.1 % (ref 39.0–75.0)
PLATELETS: 301 10*3/uL (ref 140–400)
RBC: 3.87 10*6/uL — AB (ref 4.20–5.82)
RDW: 17.2 % — ABNORMAL HIGH (ref 11.0–14.6)
WBC: 7.7 10*3/uL (ref 4.0–10.3)
lymph#: 1.6 10*3/uL (ref 0.9–3.3)

## 2016-05-05 MED ORDER — DARBEPOETIN ALFA 150 MCG/0.3ML IJ SOSY
150.0000 ug | PREFILLED_SYRINGE | Freq: Once | INTRAMUSCULAR | Status: AC
  Administered 2016-05-05: 150 ug via SUBCUTANEOUS
  Filled 2016-05-05: qty 0.3

## 2016-05-05 NOTE — Telephone Encounter (Signed)
Gave pt cal & avs °

## 2016-05-05 NOTE — Patient Instructions (Signed)
Darbepoetin Alfa injection What is this medicine? DARBEPOETIN ALFA (dar be POE e tin AL fa) helps your body make more red blood cells. It is used to treat anemia caused by chronic kidney failure and chemotherapy. This medicine may be used for other purposes; ask your health care provider or pharmacist if you have questions. COMMON BRAND NAME(S): Aranesp What should I tell my health care provider before I take this medicine? They need to know if you have any of these conditions: -blood clotting disorders or history of blood clots -cancer patient not on chemotherapy -cystic fibrosis -heart disease, such as angina, heart failure, or a history of a heart attack -hemoglobin level of 12 g/dL or greater -high blood pressure -low levels of folate, iron, or vitamin B12 -seizures -an unusual or allergic reaction to darbepoetin, erythropoietin, albumin, hamster proteins, latex, other medicines, foods, dyes, or preservatives -pregnant or trying to get pregnant -breast-feeding How should I use this medicine? This medicine is for injection into a vein or under the skin. It is usually given by a health care professional in a hospital or clinic setting. If you get this medicine at home, you will be taught how to prepare and give this medicine. Do not shake the solution before you withdraw a dose. Use exactly as directed. Take your medicine at regular intervals. Do not take your medicine more often than directed. It is important that you put your used needles and syringes in a special sharps container. Do not put them in a trash can. If you do not have a sharps container, call your pharmacist or healthcare provider to get one. Talk to your pediatrician regarding the use of this medicine in children. While this medicine may be used in children as young as 1 year for selected conditions, precautions do apply. Overdosage: If you think you have taken too much of this medicine contact a poison control center or  emergency room at once. NOTE: This medicine is only for you. Do not share this medicine with others. What if I miss a dose? If you miss a dose, take it as soon as you can. If it is almost time for your next dose, take only that dose. Do not take double or extra doses. What may interact with this medicine? Do not take this medicine with any of the following medications: -epoetin alfa This list may not describe all possible interactions. Give your health care provider a list of all the medicines, herbs, non-prescription drugs, or dietary supplements you use. Also tell them if you smoke, drink alcohol, or use illegal drugs. Some items may interact with your medicine. What should I watch for while using this medicine? Visit your prescriber or health care professional for regular checks on your progress and for the needed blood tests and blood pressure measurements. It is especially important for the doctor to make sure your hemoglobin level is in the desired range, to limit the risk of potential side effects and to give you the best benefit. Keep all appointments for any recommended tests. Check your blood pressure as directed. Ask your doctor what your blood pressure should be and when you should contact him or her. As your body makes more red blood cells, you may need to take iron, folic acid, or vitamin B supplements. Ask your doctor or health care provider which products are right for you. If you have kidney disease continue dietary restrictions, even though this medication can make you feel better. Talk with your doctor or health   care professional about the foods you eat and the vitamins that you take. What side effects may I notice from receiving this medicine? Side effects that you should report to your doctor or health care professional as soon as possible: -allergic reactions like skin rash, itching or hives, swelling of the face, lips, or tongue -breathing problems -changes in vision -chest  pain -confusion, trouble speaking or understanding -feeling faint or lightheaded, falls -high blood pressure -muscle aches or pains -pain, swelling, warmth in the leg -rapid weight gain -severe headaches -sudden numbness or weakness of the face, arm or leg -trouble walking, dizziness, loss of balance or coordination -seizures (convulsions) -swelling of the ankles, feet, hands -unusually weak or tired Side effects that usually do not require medical attention (report to your doctor or health care professional if they continue or are bothersome): -diarrhea -fever, chills (flu-like symptoms) -headaches -nausea, vomiting -redness, stinging, or swelling at site where injected This list may not describe all possible side effects. Call your doctor for medical advice about side effects. You may report side effects to FDA at 1-800-FDA-1088. Where should I keep my medicine? Keep out of the reach of children. Store in a refrigerator between 2 and 8 degrees C (36 and 46 degrees F). Do not freeze. Do not shake. Throw away any unused portion if using a single-dose vial. Throw away any unused medicine after the expiration date. NOTE: This sheet is a summary. It may not cover all possible information. If you have questions about this medicine, talk to your doctor, pharmacist, or health care provider.  2015, Elsevier/Gold Standard. (2008-08-28 10:23:57)  
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
## 2016-05-05 NOTE — Progress Notes (Signed)
May give Aranesp Injection today with elevated BP per Dr. Mosetta Putt verbal order

## 2016-05-13 DIAGNOSIS — E1165 Type 2 diabetes mellitus with hyperglycemia: Secondary | ICD-10-CM | POA: Diagnosis not present

## 2016-05-13 DIAGNOSIS — N183 Chronic kidney disease, stage 3 (moderate): Secondary | ICD-10-CM | POA: Diagnosis not present

## 2016-05-13 DIAGNOSIS — Z794 Long term (current) use of insulin: Secondary | ICD-10-CM | POA: Diagnosis not present

## 2016-05-13 DIAGNOSIS — E1122 Type 2 diabetes mellitus with diabetic chronic kidney disease: Secondary | ICD-10-CM | POA: Diagnosis not present

## 2016-05-13 DIAGNOSIS — Z5181 Encounter for therapeutic drug level monitoring: Secondary | ICD-10-CM | POA: Diagnosis not present

## 2016-05-13 DIAGNOSIS — Z89432 Acquired absence of left foot: Secondary | ICD-10-CM | POA: Diagnosis not present

## 2016-05-18 DIAGNOSIS — E1142 Type 2 diabetes mellitus with diabetic polyneuropathy: Secondary | ICD-10-CM | POA: Diagnosis not present

## 2016-05-18 DIAGNOSIS — L97421 Non-pressure chronic ulcer of left heel and midfoot limited to breakdown of skin: Secondary | ICD-10-CM | POA: Diagnosis not present

## 2016-05-18 DIAGNOSIS — Z89432 Acquired absence of left foot: Secondary | ICD-10-CM | POA: Diagnosis not present

## 2016-06-02 ENCOUNTER — Other Ambulatory Visit (HOSPITAL_BASED_OUTPATIENT_CLINIC_OR_DEPARTMENT_OTHER): Payer: 59

## 2016-06-02 ENCOUNTER — Ambulatory Visit (HOSPITAL_BASED_OUTPATIENT_CLINIC_OR_DEPARTMENT_OTHER): Payer: 59

## 2016-06-02 VITALS — BP 155/82 | HR 88 | Temp 98.4°F | Resp 20

## 2016-06-02 DIAGNOSIS — N189 Chronic kidney disease, unspecified: Secondary | ICD-10-CM | POA: Diagnosis not present

## 2016-06-02 DIAGNOSIS — D631 Anemia in chronic kidney disease: Secondary | ICD-10-CM | POA: Diagnosis not present

## 2016-06-02 DIAGNOSIS — D638 Anemia in other chronic diseases classified elsewhere: Secondary | ICD-10-CM

## 2016-06-02 LAB — COMPREHENSIVE METABOLIC PANEL
ALBUMIN: 3.2 g/dL — AB (ref 3.5–5.0)
ALK PHOS: 104 U/L (ref 40–150)
ALT: 24 U/L (ref 0–55)
AST: 18 U/L (ref 5–34)
Anion Gap: 11 mEq/L (ref 3–11)
BUN: 26.9 mg/dL — AB (ref 7.0–26.0)
CHLORIDE: 103 meq/L (ref 98–109)
CO2: 23 meq/L (ref 22–29)
Calcium: 9.1 mg/dL (ref 8.4–10.4)
Creatinine: 2 mg/dL — ABNORMAL HIGH (ref 0.7–1.3)
EGFR: 43 mL/min/{1.73_m2} — AB (ref >90)
GLUCOSE: 292 mg/dL — AB (ref 70–140)
POTASSIUM: 4.3 meq/L (ref 3.5–5.1)
SODIUM: 137 meq/L (ref 136–145)
Total Bilirubin: <0.3 mg/dL (ref 0.20–1.20)
Total Protein: 7.5 g/dL (ref 6.4–8.3)

## 2016-06-02 LAB — CBC WITH DIFFERENTIAL/PLATELET
BASO%: 0.5 % (ref 0.0–2.0)
Basophils Absolute: 0 10*3/uL (ref 0.0–0.1)
EOS ABS: 0.3 10*3/uL (ref 0.0–0.5)
EOS%: 3.6 % (ref 0.0–7.0)
HCT: 32.5 % — ABNORMAL LOW (ref 38.4–49.9)
HEMOGLOBIN: 9.9 g/dL — AB (ref 13.0–17.1)
LYMPH#: 1.6 10*3/uL (ref 0.9–3.3)
LYMPH%: 21.9 % (ref 14.0–49.0)
MCH: 23.6 pg — AB (ref 27.2–33.4)
MCHC: 30.5 g/dL — ABNORMAL LOW (ref 32.0–36.0)
MCV: 77.4 fL — ABNORMAL LOW (ref 79.3–98.0)
MONO#: 0.6 10*3/uL (ref 0.1–0.9)
MONO%: 8.9 % (ref 0.0–14.0)
NEUT#: 4.7 10*3/uL (ref 1.5–6.5)
NEUT%: 65.1 % (ref 39.0–75.0)
Platelets: 320 10*3/uL (ref 140–400)
RBC: 4.2 10*6/uL (ref 4.20–5.82)
RDW: 15.6 % — ABNORMAL HIGH (ref 11.0–14.6)
WBC: 7.2 10*3/uL (ref 4.0–10.3)

## 2016-06-02 MED ORDER — DARBEPOETIN ALFA 150 MCG/0.3ML IJ SOSY
150.0000 ug | PREFILLED_SYRINGE | Freq: Once | INTRAMUSCULAR | Status: AC
  Administered 2016-06-02: 150 ug via SUBCUTANEOUS
  Filled 2016-06-02: qty 0.3

## 2016-06-02 NOTE — Patient Instructions (Signed)
Darbepoetin Alfa injection What is this medicine? DARBEPOETIN ALFA (dar be POE e tin AL fa) helps your body make more red blood cells. It is used to treat anemia caused by chronic kidney failure and chemotherapy. This medicine may be used for other purposes; ask your health care provider or pharmacist if you have questions. COMMON BRAND NAME(S): Aranesp What should I tell my health care provider before I take this medicine? They need to know if you have any of these conditions: -blood clotting disorders or history of blood clots -cancer patient not on chemotherapy -cystic fibrosis -heart disease, such as angina, heart failure, or a history of a heart attack -hemoglobin level of 12 g/dL or greater -high blood pressure -low levels of folate, iron, or vitamin B12 -seizures -an unusual or allergic reaction to darbepoetin, erythropoietin, albumin, hamster proteins, latex, other medicines, foods, dyes, or preservatives -pregnant or trying to get pregnant -breast-feeding How should I use this medicine? This medicine is for injection into a vein or under the skin. It is usually given by a health care professional in a hospital or clinic setting. If you get this medicine at home, you will be taught how to prepare and give this medicine. Do not shake the solution before you withdraw a dose. Use exactly as directed. Take your medicine at regular intervals. Do not take your medicine more often than directed. It is important that you put your used needles and syringes in a special sharps container. Do not put them in a trash can. If you do not have a sharps container, call your pharmacist or healthcare provider to get one. Talk to your pediatrician regarding the use of this medicine in children. While this medicine may be used in children as young as 1 year for selected conditions, precautions do apply. Overdosage: If you think you have taken too much of this medicine contact a poison control center or  emergency room at once. NOTE: This medicine is only for you. Do not share this medicine with others. What if I miss a dose? If you miss a dose, take it as soon as you can. If it is almost time for your next dose, take only that dose. Do not take double or extra doses. What may interact with this medicine? Do not take this medicine with any of the following medications: -epoetin alfa This list may not describe all possible interactions. Give your health care provider a list of all the medicines, herbs, non-prescription drugs, or dietary supplements you use. Also tell them if you smoke, drink alcohol, or use illegal drugs. Some items may interact with your medicine. What should I watch for while using this medicine? Visit your prescriber or health care professional for regular checks on your progress and for the needed blood tests and blood pressure measurements. It is especially important for the doctor to make sure your hemoglobin level is in the desired range, to limit the risk of potential side effects and to give you the best benefit. Keep all appointments for any recommended tests. Check your blood pressure as directed. Ask your doctor what your blood pressure should be and when you should contact him or her. As your body makes more red blood cells, you may need to take iron, folic acid, or vitamin B supplements. Ask your doctor or health care provider which products are right for you. If you have kidney disease continue dietary restrictions, even though this medication can make you feel better. Talk with your doctor or health   care professional about the foods you eat and the vitamins that you take. What side effects may I notice from receiving this medicine? Side effects that you should report to your doctor or health care professional as soon as possible: -allergic reactions like skin rash, itching or hives, swelling of the face, lips, or tongue -breathing problems -changes in vision -chest  pain -confusion, trouble speaking or understanding -feeling faint or lightheaded, falls -high blood pressure -muscle aches or pains -pain, swelling, warmth in the leg -rapid weight gain -severe headaches -sudden numbness or weakness of the face, arm or leg -trouble walking, dizziness, loss of balance or coordination -seizures (convulsions) -swelling of the ankles, feet, hands -unusually weak or tired Side effects that usually do not require medical attention (report to your doctor or health care professional if they continue or are bothersome): -diarrhea -fever, chills (flu-like symptoms) -headaches -nausea, vomiting -redness, stinging, or swelling at site where injected This list may not describe all possible side effects. Call your doctor for medical advice about side effects. You may report side effects to FDA at 1-800-FDA-1088. Where should I keep my medicine? Keep out of the reach of children. Store in a refrigerator between 2 and 8 degrees C (36 and 46 degrees F). Do not freeze. Do not shake. Throw away any unused portion if using a single-dose vial. Throw away any unused medicine after the expiration date. NOTE: This sheet is a summary. It may not cover all possible information. If you have questions about this medicine, talk to your doctor, pharmacist, or health care provider.  2015, Elsevier/Gold Standard. (2008-08-28 10:23:57)  

## 2016-06-10 DIAGNOSIS — N183 Chronic kidney disease, stage 3 (moderate): Secondary | ICD-10-CM | POA: Diagnosis not present

## 2016-06-10 DIAGNOSIS — L97509 Non-pressure chronic ulcer of other part of unspecified foot with unspecified severity: Secondary | ICD-10-CM | POA: Diagnosis not present

## 2016-06-10 DIAGNOSIS — D631 Anemia in chronic kidney disease: Secondary | ICD-10-CM | POA: Diagnosis not present

## 2016-06-10 DIAGNOSIS — I129 Hypertensive chronic kidney disease with stage 1 through stage 4 chronic kidney disease, or unspecified chronic kidney disease: Secondary | ICD-10-CM | POA: Diagnosis not present

## 2016-06-10 DIAGNOSIS — E11621 Type 2 diabetes mellitus with foot ulcer: Secondary | ICD-10-CM | POA: Diagnosis not present

## 2016-06-10 DIAGNOSIS — E1129 Type 2 diabetes mellitus with other diabetic kidney complication: Secondary | ICD-10-CM | POA: Diagnosis not present

## 2016-06-19 DIAGNOSIS — R809 Proteinuria, unspecified: Secondary | ICD-10-CM | POA: Diagnosis not present

## 2016-06-30 ENCOUNTER — Other Ambulatory Visit (HOSPITAL_BASED_OUTPATIENT_CLINIC_OR_DEPARTMENT_OTHER): Payer: 59

## 2016-06-30 ENCOUNTER — Ambulatory Visit: Payer: 59

## 2016-06-30 DIAGNOSIS — D638 Anemia in other chronic diseases classified elsewhere: Secondary | ICD-10-CM

## 2016-06-30 DIAGNOSIS — D631 Anemia in chronic kidney disease: Secondary | ICD-10-CM | POA: Diagnosis not present

## 2016-06-30 DIAGNOSIS — N189 Chronic kidney disease, unspecified: Secondary | ICD-10-CM | POA: Diagnosis not present

## 2016-06-30 LAB — CBC WITH DIFFERENTIAL/PLATELET
BASO%: 0.4 % (ref 0.0–2.0)
Basophils Absolute: 0 10*3/uL (ref 0.0–0.1)
EOS%: 3.8 % (ref 0.0–7.0)
Eosinophils Absolute: 0.3 10*3/uL (ref 0.0–0.5)
HCT: 36.6 % — ABNORMAL LOW (ref 38.4–49.9)
HGB: 11.2 g/dL — ABNORMAL LOW (ref 13.0–17.1)
LYMPH%: 20.6 % (ref 14.0–49.0)
MCH: 23.4 pg — ABNORMAL LOW (ref 27.2–33.4)
MCHC: 30.5 g/dL — ABNORMAL LOW (ref 32.0–36.0)
MCV: 76.7 fL — ABNORMAL LOW (ref 79.3–98.0)
MONO#: 0.6 10*3/uL (ref 0.1–0.9)
MONO%: 8 % (ref 0.0–14.0)
NEUT%: 67.2 % (ref 39.0–75.0)
NEUTROS ABS: 4.7 10*3/uL (ref 1.5–6.5)
Platelets: 306 10*3/uL (ref 140–400)
RBC: 4.78 10*6/uL (ref 4.20–5.82)
RDW: 16.3 % — ABNORMAL HIGH (ref 11.0–14.6)
WBC: 7.1 10*3/uL (ref 4.0–10.3)
lymph#: 1.5 10*3/uL (ref 0.9–3.3)

## 2016-06-30 LAB — IRON AND TIBC
%SAT: 30 % (ref 20–55)
Iron: 70 ug/dL (ref 42–163)
TIBC: 235 ug/dL (ref 202–409)
UIBC: 166 ug/dL (ref 117–376)

## 2016-06-30 LAB — FERRITIN: FERRITIN: 544 ng/mL — AB (ref 22–316)

## 2016-06-30 NOTE — Progress Notes (Signed)
Hgb of 11.2 noted on labs today, no injection needed per parameters. Pt and family member given copy of todays labs and current schedule. Instructed to keep up coming app. And to call office if issues occur. Verbalized understanding of instructions.

## 2016-07-08 ENCOUNTER — Ambulatory Visit (INDEPENDENT_AMBULATORY_CARE_PROVIDER_SITE_OTHER): Payer: 59 | Admitting: Orthopedic Surgery

## 2016-07-08 DIAGNOSIS — E1142 Type 2 diabetes mellitus with diabetic polyneuropathy: Secondary | ICD-10-CM

## 2016-07-08 DIAGNOSIS — L97421 Non-pressure chronic ulcer of left heel and midfoot limited to breakdown of skin: Secondary | ICD-10-CM

## 2016-07-08 DIAGNOSIS — Z89432 Acquired absence of left foot: Secondary | ICD-10-CM

## 2016-07-28 ENCOUNTER — Other Ambulatory Visit (HOSPITAL_BASED_OUTPATIENT_CLINIC_OR_DEPARTMENT_OTHER): Payer: 59

## 2016-07-28 ENCOUNTER — Ambulatory Visit (HOSPITAL_BASED_OUTPATIENT_CLINIC_OR_DEPARTMENT_OTHER): Payer: 59

## 2016-07-28 VITALS — BP 158/82 | HR 86 | Temp 98.3°F | Resp 18

## 2016-07-28 DIAGNOSIS — D638 Anemia in other chronic diseases classified elsewhere: Secondary | ICD-10-CM

## 2016-07-28 DIAGNOSIS — D631 Anemia in chronic kidney disease: Secondary | ICD-10-CM

## 2016-07-28 DIAGNOSIS — N189 Chronic kidney disease, unspecified: Secondary | ICD-10-CM

## 2016-07-28 LAB — CBC WITH DIFFERENTIAL/PLATELET
BASO%: 0.5 % (ref 0.0–2.0)
Basophils Absolute: 0 10*3/uL (ref 0.0–0.1)
EOS ABS: 0.2 10*3/uL (ref 0.0–0.5)
EOS%: 3.3 % (ref 0.0–7.0)
HEMATOCRIT: 33.6 % — AB (ref 38.4–49.9)
HEMOGLOBIN: 10.2 g/dL — AB (ref 13.0–17.1)
LYMPH#: 1.2 10*3/uL (ref 0.9–3.3)
LYMPH%: 19.4 % (ref 14.0–49.0)
MCH: 23.1 pg — ABNORMAL LOW (ref 27.2–33.4)
MCHC: 30.4 g/dL — ABNORMAL LOW (ref 32.0–36.0)
MCV: 76.1 fL — AB (ref 79.3–98.0)
MONO#: 0.4 10*3/uL (ref 0.1–0.9)
MONO%: 6.9 % (ref 0.0–14.0)
NEUT%: 69.9 % (ref 39.0–75.0)
NEUTROS ABS: 4.3 10*3/uL (ref 1.5–6.5)
Platelets: 229 10*3/uL (ref 140–400)
RBC: 4.42 10*6/uL (ref 4.20–5.82)
RDW: 16.5 % — AB (ref 11.0–14.6)
WBC: 6.2 10*3/uL (ref 4.0–10.3)

## 2016-07-28 MED ORDER — DARBEPOETIN ALFA 150 MCG/0.3ML IJ SOSY
150.0000 ug | PREFILLED_SYRINGE | Freq: Once | INTRAMUSCULAR | Status: AC
  Administered 2016-07-28: 150 ug via SUBCUTANEOUS
  Filled 2016-07-28: qty 0.3

## 2016-07-28 NOTE — Patient Instructions (Signed)
Darbepoetin Alfa injection What is this medicine? DARBEPOETIN ALFA (dar be POE e tin AL fa) helps your body make more red blood cells. It is used to treat anemia caused by chronic kidney failure and chemotherapy. This medicine may be used for other purposes; ask your health care provider or pharmacist if you have questions. COMMON BRAND NAME(S): Aranesp What should I tell my health care provider before I take this medicine? They need to know if you have any of these conditions: -blood clotting disorders or history of blood clots -cancer patient not on chemotherapy -cystic fibrosis -heart disease, such as angina, heart failure, or a history of a heart attack -hemoglobin level of 12 g/dL or greater -high blood pressure -low levels of folate, iron, or vitamin B12 -seizures -an unusual or allergic reaction to darbepoetin, erythropoietin, albumin, hamster proteins, latex, other medicines, foods, dyes, or preservatives -pregnant or trying to get pregnant -breast-feeding How should I use this medicine? This medicine is for injection into a vein or under the skin. It is usually given by a health care professional in a hospital or clinic setting. If you get this medicine at home, you will be taught how to prepare and give this medicine. Do not shake the solution before you withdraw a dose. Use exactly as directed. Take your medicine at regular intervals. Do not take your medicine more often than directed. It is important that you put your used needles and syringes in a special sharps container. Do not put them in a trash can. If you do not have a sharps container, call your pharmacist or healthcare provider to get one. Talk to your pediatrician regarding the use of this medicine in children. While this medicine may be used in children as young as 1 year for selected conditions, precautions do apply. Overdosage: If you think you have taken too much of this medicine contact a poison control center or  emergency room at once. NOTE: This medicine is only for you. Do not share this medicine with others. What if I miss a dose? If you miss a dose, take it as soon as you can. If it is almost time for your next dose, take only that dose. Do not take double or extra doses. What may interact with this medicine? Do not take this medicine with any of the following medications: -epoetin alfa This list may not describe all possible interactions. Give your health care provider a list of all the medicines, herbs, non-prescription drugs, or dietary supplements you use. Also tell them if you smoke, drink alcohol, or use illegal drugs. Some items may interact with your medicine. What should I watch for while using this medicine? Visit your prescriber or health care professional for regular checks on your progress and for the needed blood tests and blood pressure measurements. It is especially important for the doctor to make sure your hemoglobin level is in the desired range, to limit the risk of potential side effects and to give you the best benefit. Keep all appointments for any recommended tests. Check your blood pressure as directed. Ask your doctor what your blood pressure should be and when you should contact him or her. As your body makes more red blood cells, you may need to take iron, folic acid, or vitamin B supplements. Ask your doctor or health care provider which products are right for you. If you have kidney disease continue dietary restrictions, even though this medication can make you feel better. Talk with your doctor or health   care professional about the foods you eat and the vitamins that you take. What side effects may I notice from receiving this medicine? Side effects that you should report to your doctor or health care professional as soon as possible: -allergic reactions like skin rash, itching or hives, swelling of the face, lips, or tongue -breathing problems -changes in vision -chest  pain -confusion, trouble speaking or understanding -feeling faint or lightheaded, falls -high blood pressure -muscle aches or pains -pain, swelling, warmth in the leg -rapid weight gain -severe headaches -sudden numbness or weakness of the face, arm or leg -trouble walking, dizziness, loss of balance or coordination -seizures (convulsions) -swelling of the ankles, feet, hands -unusually weak or tired Side effects that usually do not require medical attention (report to your doctor or health care professional if they continue or are bothersome): -diarrhea -fever, chills (flu-like symptoms) -headaches -nausea, vomiting -redness, stinging, or swelling at site where injected This list may not describe all possible side effects. Call your doctor for medical advice about side effects. You may report side effects to FDA at 1-800-FDA-1088. Where should I keep my medicine? Keep out of the reach of children. Store in a refrigerator between 2 and 8 degrees C (36 and 46 degrees F). Do not freeze. Do not shake. Throw away any unused portion if using a single-dose vial. Throw away any unused medicine after the expiration date. NOTE: This sheet is a summary. It may not cover all possible information. If you have questions about this medicine, talk to your doctor, pharmacist, or health care provider.  2015, Elsevier/Gold Standard. (2008-08-28 10:23:57)  

## 2016-08-06 ENCOUNTER — Encounter (INDEPENDENT_AMBULATORY_CARE_PROVIDER_SITE_OTHER): Payer: Self-pay | Admitting: Orthopedic Surgery

## 2016-08-06 ENCOUNTER — Ambulatory Visit (INDEPENDENT_AMBULATORY_CARE_PROVIDER_SITE_OTHER): Payer: 59 | Admitting: Family

## 2016-08-06 VITALS — Ht 71.0 in | Wt 164.0 lb

## 2016-08-06 DIAGNOSIS — Z89432 Acquired absence of left foot: Secondary | ICD-10-CM | POA: Diagnosis not present

## 2016-08-06 DIAGNOSIS — L97521 Non-pressure chronic ulcer of other part of left foot limited to breakdown of skin: Secondary | ICD-10-CM

## 2016-08-06 DIAGNOSIS — E118 Type 2 diabetes mellitus with unspecified complications: Secondary | ICD-10-CM

## 2016-08-06 DIAGNOSIS — E1165 Type 2 diabetes mellitus with hyperglycemia: Secondary | ICD-10-CM
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
## 2016-08-23 ENCOUNTER — Other Ambulatory Visit: Payer: Self-pay | Admitting: Hematology

## 2016-08-25 ENCOUNTER — Other Ambulatory Visit (HOSPITAL_BASED_OUTPATIENT_CLINIC_OR_DEPARTMENT_OTHER): Payer: 59

## 2016-08-25 ENCOUNTER — Ambulatory Visit (HOSPITAL_BASED_OUTPATIENT_CLINIC_OR_DEPARTMENT_OTHER): Payer: 59

## 2016-08-25 VITALS — BP 149/83 | HR 88 | Temp 98.2°F | Resp 20

## 2016-08-25 DIAGNOSIS — N189 Chronic kidney disease, unspecified: Secondary | ICD-10-CM | POA: Diagnosis not present

## 2016-08-25 DIAGNOSIS — D631 Anemia in chronic kidney disease: Secondary | ICD-10-CM

## 2016-08-25 DIAGNOSIS — D638 Anemia in other chronic diseases classified elsewhere: Secondary | ICD-10-CM

## 2016-08-25 LAB — CBC WITH DIFFERENTIAL/PLATELET
BASO%: 0.2 % (ref 0.0–2.0)
BASOS ABS: 0 10*3/uL (ref 0.0–0.1)
EOS ABS: 0.3 10*3/uL (ref 0.0–0.5)
EOS%: 3.9 % (ref 0.0–7.0)
HCT: 32.5 % — ABNORMAL LOW (ref 38.4–49.9)
HEMOGLOBIN: 10 g/dL — AB (ref 13.0–17.1)
LYMPH%: 18 % (ref 14.0–49.0)
MCH: 23.6 pg — AB (ref 27.2–33.4)
MCHC: 30.8 g/dL — AB (ref 32.0–36.0)
MCV: 76.8 fL — AB (ref 79.3–98.0)
MONO#: 0.6 10*3/uL (ref 0.1–0.9)
MONO%: 8.9 % (ref 0.0–14.0)
NEUT#: 4.4 10*3/uL (ref 1.5–6.5)
NEUT%: 69 % (ref 39.0–75.0)
Platelets: 296 10*3/uL (ref 140–400)
RBC: 4.23 10*6/uL (ref 4.20–5.82)
RDW: 16.1 % — AB (ref 11.0–14.6)
WBC: 6.4 10*3/uL (ref 4.0–10.3)
lymph#: 1.2 10*3/uL (ref 0.9–3.3)

## 2016-08-25 LAB — COMPREHENSIVE METABOLIC PANEL
ALBUMIN: 2.8 g/dL — AB (ref 3.5–5.0)
ALK PHOS: 105 U/L (ref 40–150)
ALT: 17 U/L (ref 0–55)
AST: 14 U/L (ref 5–34)
Anion Gap: 7 mEq/L (ref 3–11)
BUN: 17.9 mg/dL (ref 7.0–26.0)
CALCIUM: 8.9 mg/dL (ref 8.4–10.4)
CO2: 25 mEq/L (ref 22–29)
Chloride: 105 mEq/L (ref 98–109)
Creatinine: 1.6 mg/dL — ABNORMAL HIGH (ref 0.7–1.3)
EGFR: 55 mL/min/{1.73_m2} — AB (ref >90)
GLUCOSE: 201 mg/dL — AB (ref 70–140)
POTASSIUM: 3.8 meq/L (ref 3.5–5.1)
SODIUM: 138 meq/L (ref 136–145)
Total Bilirubin: 0.57 mg/dL (ref 0.20–1.20)
Total Protein: 7 g/dL (ref 6.4–8.3)

## 2016-08-25 MED ORDER — DARBEPOETIN ALFA 150 MCG/0.3ML IJ SOSY
150.0000 ug | PREFILLED_SYRINGE | Freq: Once | INTRAMUSCULAR | Status: AC
  Administered 2016-08-25: 150 ug via SUBCUTANEOUS
  Filled 2016-08-25: qty 0.3

## 2016-08-25 NOTE — Patient Instructions (Signed)
Darbepoetin Alfa injection What is this medicine? DARBEPOETIN ALFA (dar be POE e tin AL fa) helps your body make more red blood cells. It is used to treat anemia caused by chronic kidney failure and chemotherapy. This medicine may be used for other purposes; ask your health care provider or pharmacist if you have questions. COMMON BRAND NAME(S): Aranesp What should I tell my health care provider before I take this medicine? They need to know if you have any of these conditions: -blood clotting disorders or history of blood clots -cancer patient not on chemotherapy -cystic fibrosis -heart disease, such as angina, heart failure, or a history of a heart attack -hemoglobin level of 12 g/dL or greater -high blood pressure -low levels of folate, iron, or vitamin B12 -seizures -an unusual or allergic reaction to darbepoetin, erythropoietin, albumin, hamster proteins, latex, other medicines, foods, dyes, or preservatives -pregnant or trying to get pregnant -breast-feeding How should I use this medicine? This medicine is for injection into a vein or under the skin. It is usually given by a health care professional in a hospital or clinic setting. If you get this medicine at home, you will be taught how to prepare and give this medicine. Do not shake the solution before you withdraw a dose. Use exactly as directed. Take your medicine at regular intervals. Do not take your medicine more often than directed. It is important that you put your used needles and syringes in a special sharps container. Do not put them in a trash can. If you do not have a sharps container, call your pharmacist or healthcare provider to get one. Talk to your pediatrician regarding the use of this medicine in children. While this medicine may be used in children as young as 1 year for selected conditions, precautions do apply. Overdosage: If you think you have taken too much of this medicine contact a poison control center or  emergency room at once. NOTE: This medicine is only for you. Do not share this medicine with others. What if I miss a dose? If you miss a dose, take it as soon as you can. If it is almost time for your next dose, take only that dose. Do not take double or extra doses. What may interact with this medicine? Do not take this medicine with any of the following medications: -epoetin alfa This list may not describe all possible interactions. Give your health care provider a list of all the medicines, herbs, non-prescription drugs, or dietary supplements you use. Also tell them if you smoke, drink alcohol, or use illegal drugs. Some items may interact with your medicine. What should I watch for while using this medicine? Visit your prescriber or health care professional for regular checks on your progress and for the needed blood tests and blood pressure measurements. It is especially important for the doctor to make sure your hemoglobin level is in the desired range, to limit the risk of potential side effects and to give you the best benefit. Keep all appointments for any recommended tests. Check your blood pressure as directed. Ask your doctor what your blood pressure should be and when you should contact him or her. As your body makes more red blood cells, you may need to take iron, folic acid, or vitamin B supplements. Ask your doctor or health care provider which products are right for you. If you have kidney disease continue dietary restrictions, even though this medication can make you feel better. Talk with your doctor or health   care professional about the foods you eat and the vitamins that you take. What side effects may I notice from receiving this medicine? Side effects that you should report to your doctor or health care professional as soon as possible: -allergic reactions like skin rash, itching or hives, swelling of the face, lips, or tongue -breathing problems -changes in vision -chest  pain -confusion, trouble speaking or understanding -feeling faint or lightheaded, falls -high blood pressure -muscle aches or pains -pain, swelling, warmth in the leg -rapid weight gain -severe headaches -sudden numbness or weakness of the face, arm or leg -trouble walking, dizziness, loss of balance or coordination -seizures (convulsions) -swelling of the ankles, feet, hands -unusually weak or tired Side effects that usually do not require medical attention (report to your doctor or health care professional if they continue or are bothersome): -diarrhea -fever, chills (flu-like symptoms) -headaches -nausea, vomiting -redness, stinging, or swelling at site where injected This list may not describe all possible side effects. Call your doctor for medical advice about side effects. You may report side effects to FDA at 1-800-FDA-1088. Where should I keep my medicine? Keep out of the reach of children. Store in a refrigerator between 2 and 8 degrees C (36 and 46 degrees F). Do not freeze. Do not shake. Throw away any unused portion if using a single-dose vial. Throw away any unused medicine after the expiration date. NOTE: This sheet is a summary. It may not cover all possible information. If you have questions about this medicine, talk to your doctor, pharmacist, or health care provider.  2015, Elsevier/Gold Standard. (2008-08-28 10:23:57)  

## 2016-09-03 ENCOUNTER — Ambulatory Visit (INDEPENDENT_AMBULATORY_CARE_PROVIDER_SITE_OTHER): Payer: 59 | Admitting: Orthopedic Surgery

## 2016-09-03 ENCOUNTER — Encounter (INDEPENDENT_AMBULATORY_CARE_PROVIDER_SITE_OTHER): Payer: Self-pay | Admitting: Orthopedic Surgery

## 2016-09-03 VITALS — Ht 71.0 in | Wt 164.0 lb

## 2016-09-03 DIAGNOSIS — Z89432 Acquired absence of left foot: Secondary | ICD-10-CM

## 2016-09-03 DIAGNOSIS — L97502 Non-pressure chronic ulcer of other part of unspecified foot with fat layer exposed: Secondary | ICD-10-CM | POA: Diagnosis not present

## 2016-09-03 HISTORY — DX: Acquired absence of left foot: Z89.432

## 2016-09-03 MED ORDER — DOXYCYCLINE HYCLATE 100 MG PO TABS: 100.0000 mg | ORAL_TABLET | Freq: Two times a day (BID) | ORAL | 0 refills | Status: DC
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
## 2016-09-22 ENCOUNTER — Telehealth: Payer: Self-pay | Admitting: Hematology

## 2016-09-22 ENCOUNTER — Other Ambulatory Visit (HOSPITAL_BASED_OUTPATIENT_CLINIC_OR_DEPARTMENT_OTHER): Payer: 59

## 2016-09-22 ENCOUNTER — Ambulatory Visit (HOSPITAL_BASED_OUTPATIENT_CLINIC_OR_DEPARTMENT_OTHER): Payer: 59

## 2016-09-22 VITALS — BP 159/87 | HR 82 | Temp 98.2°F | Resp 20

## 2016-09-22 DIAGNOSIS — N189 Chronic kidney disease, unspecified: Secondary | ICD-10-CM | POA: Diagnosis not present

## 2016-09-22 DIAGNOSIS — D638 Anemia in other chronic diseases classified elsewhere: Secondary | ICD-10-CM

## 2016-09-22 DIAGNOSIS — D631 Anemia in chronic kidney disease: Secondary | ICD-10-CM | POA: Diagnosis not present

## 2016-09-22 LAB — CBC WITH DIFFERENTIAL/PLATELET
BASO%: 0.5 % (ref 0.0–2.0)
BASOS ABS: 0 10*3/uL (ref 0.0–0.1)
EOS ABS: 0.2 10*3/uL (ref 0.0–0.5)
EOS%: 3 % (ref 0.0–7.0)
HCT: 33.9 % — ABNORMAL LOW (ref 38.4–49.9)
HGB: 10.4 g/dL — ABNORMAL LOW (ref 13.0–17.1)
LYMPH%: 19.1 % (ref 14.0–49.0)
MCH: 23.9 pg — AB (ref 27.2–33.4)
MCHC: 30.8 g/dL — AB (ref 32.0–36.0)
MCV: 77.6 fL — AB (ref 79.3–98.0)
MONO#: 0.7 10*3/uL (ref 0.1–0.9)
MONO%: 8.6 % (ref 0.0–14.0)
NEUT#: 5.3 10*3/uL (ref 1.5–6.5)
NEUT%: 68.8 % (ref 39.0–75.0)
Platelets: 244 10*3/uL (ref 140–400)
RBC: 4.37 10*6/uL (ref 4.20–5.82)
RDW: 16.2 % — ABNORMAL HIGH (ref 11.0–14.6)
WBC: 7.7 10*3/uL (ref 4.0–10.3)
lymph#: 1.5 10*3/uL (ref 0.9–3.3)

## 2016-09-22 MED ORDER — DARBEPOETIN ALFA 150 MCG/0.3ML IJ SOSY
150.0000 ug | PREFILLED_SYRINGE | Freq: Once | INTRAMUSCULAR | Status: AC
  Administered 2016-09-22: 150 ug via SUBCUTANEOUS
  Filled 2016-09-22: qty 0.3

## 2016-09-22 NOTE — Patient Instructions (Signed)

## 2016-09-22 NOTE — Telephone Encounter (Signed)
Follow up appointment , lab and injection appointments was scheduled per Dr Mosetta Putt (verbal) and patient request. Patient was given a copy of the AVS report and appointment schedule. 09/22/16

## 2016-09-24 ENCOUNTER — Ambulatory Visit (INDEPENDENT_AMBULATORY_CARE_PROVIDER_SITE_OTHER): Payer: 59 | Admitting: Family

## 2016-09-24 DIAGNOSIS — L97921 Non-pressure chronic ulcer of unspecified part of left lower leg limited to breakdown of skin: Secondary | ICD-10-CM

## 2016-09-24 DIAGNOSIS — E1165 Type 2 diabetes mellitus with hyperglycemia: Secondary | ICD-10-CM

## 2016-09-24 DIAGNOSIS — E118 Type 2 diabetes mellitus with unspecified complications: Secondary | ICD-10-CM

## 2016-09-24 DIAGNOSIS — Z89432 Acquired absence of left foot: Secondary | ICD-10-CM
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
## 2016-10-20 ENCOUNTER — Encounter: Payer: Self-pay | Admitting: Hematology

## 2016-10-20 ENCOUNTER — Other Ambulatory Visit (HOSPITAL_BASED_OUTPATIENT_CLINIC_OR_DEPARTMENT_OTHER): Payer: 59

## 2016-10-20 ENCOUNTER — Telehealth: Payer: Self-pay | Admitting: Hematology

## 2016-10-20 ENCOUNTER — Other Ambulatory Visit: Payer: Self-pay

## 2016-10-20 ENCOUNTER — Ambulatory Visit (HOSPITAL_BASED_OUTPATIENT_CLINIC_OR_DEPARTMENT_OTHER): Payer: 59 | Admitting: Hematology

## 2016-10-20 ENCOUNTER — Ambulatory Visit: Payer: 59

## 2016-10-20 VITALS — BP 141/76 | HR 88 | Temp 99.0°F | Resp 16 | Ht 71.0 in | Wt 161.0 lb

## 2016-10-20 DIAGNOSIS — I10 Essential (primary) hypertension: Secondary | ICD-10-CM | POA: Diagnosis not present

## 2016-10-20 DIAGNOSIS — D638 Anemia in other chronic diseases classified elsewhere: Secondary | ICD-10-CM | POA: Diagnosis not present

## 2016-10-20 DIAGNOSIS — N189 Chronic kidney disease, unspecified: Secondary | ICD-10-CM | POA: Diagnosis not present

## 2016-10-20 DIAGNOSIS — D631 Anemia in chronic kidney disease: Secondary | ICD-10-CM | POA: Diagnosis not present

## 2016-10-20 DIAGNOSIS — E119 Type 2 diabetes mellitus without complications: Secondary | ICD-10-CM

## 2016-10-20 LAB — CBC WITH DIFFERENTIAL/PLATELET
BASO%: 0.4 % (ref 0.0–2.0)
BASOS ABS: 0 10*3/uL (ref 0.0–0.1)
EOS ABS: 0.3 10*3/uL (ref 0.0–0.5)
EOS%: 3.6 % (ref 0.0–7.0)
HEMATOCRIT: 36.8 % — AB (ref 38.4–49.9)
HGB: 11.4 g/dL — ABNORMAL LOW (ref 13.0–17.1)
LYMPH#: 1.4 10*3/uL (ref 0.9–3.3)
LYMPH%: 18.9 % (ref 14.0–49.0)
MCH: 23.7 pg — ABNORMAL LOW (ref 27.2–33.4)
MCHC: 30.9 g/dL — AB (ref 32.0–36.0)
MCV: 76.7 fL — AB (ref 79.3–98.0)
MONO#: 0.6 10*3/uL (ref 0.1–0.9)
MONO%: 7.7 % (ref 0.0–14.0)
NEUT#: 5.1 10*3/uL (ref 1.5–6.5)
NEUT%: 69.4 % (ref 39.0–75.0)
PLATELETS: 275 10*3/uL (ref 140–400)
RBC: 4.79 10*6/uL (ref 4.20–5.82)
RDW: 16.3 % — ABNORMAL HIGH (ref 11.0–14.6)
WBC: 7.4 10*3/uL (ref 4.0–10.3)

## 2016-10-20 NOTE — Telephone Encounter (Signed)
Appointments scheduled per 1/23 LOS. Patient given AVS report and calendars with future scheduled appointments. °

## 2016-10-20 NOTE — Progress Notes (Signed)
Injection not given, Hgb today was 11.4, this appointment was canceled per Dr. Mosetta Putt

## 2016-10-26 DIAGNOSIS — N183 Chronic kidney disease, stage 3 (moderate): Secondary | ICD-10-CM | POA: Diagnosis not present

## 2016-10-26 DIAGNOSIS — D631 Anemia in chronic kidney disease: Secondary | ICD-10-CM | POA: Diagnosis not present

## 2016-10-26 DIAGNOSIS — E1129 Type 2 diabetes mellitus with other diabetic kidney complication: Secondary | ICD-10-CM | POA: Diagnosis not present

## 2016-10-26 DIAGNOSIS — I129 Hypertensive chronic kidney disease with stage 1 through stage 4 chronic kidney disease, or unspecified chronic kidney disease: Secondary | ICD-10-CM | POA: Diagnosis not present

## 2016-10-26 DIAGNOSIS — I739 Peripheral vascular disease, unspecified: Secondary | ICD-10-CM | POA: Diagnosis not present

## 2016-10-26 DIAGNOSIS — E785 Hyperlipidemia, unspecified: Secondary | ICD-10-CM | POA: Diagnosis not present

## 2016-10-26 DIAGNOSIS — L97509 Non-pressure chronic ulcer of other part of unspecified foot with unspecified severity: Secondary | ICD-10-CM | POA: Diagnosis not present

## 2016-10-26 DIAGNOSIS — E11621 Type 2 diabetes mellitus with foot ulcer: Secondary | ICD-10-CM | POA: Diagnosis not present

## 2016-10-29 ENCOUNTER — Ambulatory Visit (INDEPENDENT_AMBULATORY_CARE_PROVIDER_SITE_OTHER): Payer: 59 | Admitting: Orthopedic Surgery

## 2016-10-29 ENCOUNTER — Encounter (INDEPENDENT_AMBULATORY_CARE_PROVIDER_SITE_OTHER): Payer: Self-pay | Admitting: Orthopedic Surgery

## 2016-10-29 VITALS — Ht 71.0 in | Wt 161.0 lb

## 2016-10-29 DIAGNOSIS — Z89432 Acquired absence of left foot: Secondary | ICD-10-CM

## 2016-10-29 DIAGNOSIS — E1165 Type 2 diabetes mellitus with hyperglycemia: Secondary | ICD-10-CM

## 2016-10-29 DIAGNOSIS — L97521 Non-pressure chronic ulcer of other part of left foot limited to breakdown of skin: Secondary | ICD-10-CM | POA: Diagnosis not present

## 2016-10-29 DIAGNOSIS — E118 Type 2 diabetes mellitus with unspecified complications: Secondary | ICD-10-CM
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
## 2016-11-06 DIAGNOSIS — E1122 Type 2 diabetes mellitus with diabetic chronic kidney disease: Secondary | ICD-10-CM | POA: Diagnosis not present

## 2016-11-06 DIAGNOSIS — L97529 Non-pressure chronic ulcer of other part of left foot with unspecified severity: Secondary | ICD-10-CM | POA: Diagnosis not present

## 2016-11-06 DIAGNOSIS — N183 Chronic kidney disease, stage 3 (moderate): Secondary | ICD-10-CM | POA: Diagnosis not present

## 2016-11-06 DIAGNOSIS — Z89432 Acquired absence of left foot: Secondary | ICD-10-CM | POA: Diagnosis not present

## 2016-11-06 DIAGNOSIS — Z794 Long term (current) use of insulin: Secondary | ICD-10-CM | POA: Diagnosis not present

## 2016-11-06 DIAGNOSIS — Z87891 Personal history of nicotine dependence: Secondary | ICD-10-CM | POA: Diagnosis not present

## 2016-11-06 DIAGNOSIS — Z5181 Encounter for therapeutic drug level monitoring: Secondary | ICD-10-CM | POA: Diagnosis not present

## 2016-11-06 DIAGNOSIS — E1165 Type 2 diabetes mellitus with hyperglycemia: Secondary | ICD-10-CM | POA: Diagnosis not present

## 2016-11-16 ENCOUNTER — Other Ambulatory Visit: Payer: Self-pay | Admitting: *Deleted

## 2016-11-16 DIAGNOSIS — D649 Anemia, unspecified: Secondary | ICD-10-CM

## 2016-11-17 ENCOUNTER — Other Ambulatory Visit (HOSPITAL_BASED_OUTPATIENT_CLINIC_OR_DEPARTMENT_OTHER): Payer: 59

## 2016-11-17 ENCOUNTER — Ambulatory Visit (HOSPITAL_BASED_OUTPATIENT_CLINIC_OR_DEPARTMENT_OTHER): Payer: 59

## 2016-11-17 VITALS — BP 147/90 | HR 84 | Temp 97.5°F | Resp 18

## 2016-11-17 DIAGNOSIS — D631 Anemia in chronic kidney disease: Secondary | ICD-10-CM

## 2016-11-17 DIAGNOSIS — D638 Anemia in other chronic diseases classified elsewhere: Secondary | ICD-10-CM

## 2016-11-17 DIAGNOSIS — D649 Anemia, unspecified: Secondary | ICD-10-CM

## 2016-11-17 DIAGNOSIS — N189 Chronic kidney disease, unspecified: Secondary | ICD-10-CM | POA: Diagnosis not present

## 2016-11-17 LAB — CBC & DIFF AND RETIC
BASO%: 0.2 % (ref 0.0–2.0)
BASOS ABS: 0 10*3/uL (ref 0.0–0.1)
EOS%: 3.8 % (ref 0.0–7.0)
Eosinophils Absolute: 0.2 10*3/uL (ref 0.0–0.5)
HCT: 31.8 % — ABNORMAL LOW (ref 38.4–49.9)
HGB: 9.6 g/dL — ABNORMAL LOW (ref 13.0–17.1)
Immature Retic Fract: 1.6 % — ABNORMAL LOW (ref 3.00–10.60)
LYMPH#: 1.3 10*3/uL (ref 0.9–3.3)
LYMPH%: 20.4 % (ref 14.0–49.0)
MCH: 23.3 pg — ABNORMAL LOW (ref 27.2–33.4)
MCHC: 30.2 g/dL — AB (ref 32.0–36.0)
MCV: 77.2 fL — ABNORMAL LOW (ref 79.3–98.0)
MONO#: 0.6 10*3/uL (ref 0.1–0.9)
MONO%: 8.6 % (ref 0.0–14.0)
NEUT#: 4.3 10*3/uL (ref 1.5–6.5)
NEUT%: 67 % (ref 39.0–75.0)
Platelets: 257 10*3/uL (ref 140–400)
RBC: 4.12 10*6/uL — AB (ref 4.20–5.82)
RDW: 16.1 % — AB (ref 11.0–14.6)
RETIC %: 0.73 % — AB (ref 0.80–1.80)
RETIC CT ABS: 30.08 10*3/uL — AB (ref 34.80–93.90)
WBC: 6.4 10*3/uL (ref 4.0–10.3)

## 2016-11-17 LAB — COMPREHENSIVE METABOLIC PANEL
ALT: 30 U/L (ref 0–55)
AST: 19 U/L (ref 5–34)
Albumin: 3.1 g/dL — ABNORMAL LOW (ref 3.5–5.0)
Alkaline Phosphatase: 126 U/L (ref 40–150)
Anion Gap: 7 mEq/L (ref 3–11)
BUN: 24.2 mg/dL (ref 7.0–26.0)
CALCIUM: 9.6 mg/dL (ref 8.4–10.4)
CHLORIDE: 108 meq/L (ref 98–109)
CO2: 24 meq/L (ref 22–29)
CREATININE: 1.7 mg/dL — AB (ref 0.7–1.3)
EGFR: 51 mL/min/{1.73_m2} — ABNORMAL LOW (ref >90)
Glucose: 238 mg/dl — ABNORMAL HIGH (ref 70–140)
Potassium: 4.4 mEq/L (ref 3.5–5.1)
Sodium: 139 mEq/L (ref 136–145)
Total Bilirubin: 0.64 mg/dL (ref 0.20–1.20)
Total Protein: 7 g/dL (ref 6.4–8.3)

## 2016-11-17 MED ORDER — DARBEPOETIN ALFA 150 MCG/0.3ML IJ SOSY
150.0000 ug | PREFILLED_SYRINGE | Freq: Once | INTRAMUSCULAR | Status: AC
  Administered 2016-11-17: 150 ug via SUBCUTANEOUS
  Filled 2016-11-17: qty 0.3

## 2016-11-17 NOTE — Patient Instructions (Signed)
Darbepoetin Alfa injection What is this medicine? DARBEPOETIN ALFA (dar be POE e tin AL fa) helps your body make more red blood cells. It is used to treat anemia caused by chronic kidney failure and chemotherapy. This medicine may be used for other purposes; ask your health care provider or pharmacist if you have questions. COMMON BRAND NAME(S): Aranesp What should I tell my health care provider before I take this medicine? They need to know if you have any of these conditions: -blood clotting disorders or history of blood clots -cancer patient not on chemotherapy -cystic fibrosis -heart disease, such as angina, heart failure, or a history of a heart attack -hemoglobin level of 12 g/dL or greater -high blood pressure -low levels of folate, iron, or vitamin B12 -seizures -an unusual or allergic reaction to darbepoetin, erythropoietin, albumin, hamster proteins, latex, other medicines, foods, dyes, or preservatives -pregnant or trying to get pregnant -breast-feeding How should I use this medicine? This medicine is for injection into a vein or under the skin. It is usually given by a health care professional in a hospital or clinic setting. If you get this medicine at home, you will be taught how to prepare and give this medicine. Do not shake the solution before you withdraw a dose. Use exactly as directed. Take your medicine at regular intervals. Do not take your medicine more often than directed. It is important that you put your used needles and syringes in a special sharps container. Do not put them in a trash can. If you do not have a sharps container, call your pharmacist or healthcare provider to get one. Talk to your pediatrician regarding the use of this medicine in children. While this medicine may be used in children as young as 1 year for selected conditions, precautions do apply. Overdosage: If you think you have taken too much of this medicine contact a poison control center or  emergency room at once. NOTE: This medicine is only for you. Do not share this medicine with others. What if I miss a dose? If you miss a dose, take it as soon as you can. If it is almost time for your next dose, take only that dose. Do not take double or extra doses. What may interact with this medicine? Do not take this medicine with any of the following medications: -epoetin alfa This list may not describe all possible interactions. Give your health care provider a list of all the medicines, herbs, non-prescription drugs, or dietary supplements you use. Also tell them if you smoke, drink alcohol, or use illegal drugs. Some items may interact with your medicine. What should I watch for while using this medicine? Visit your prescriber or health care professional for regular checks on your progress and for the needed blood tests and blood pressure measurements. It is especially important for the doctor to make sure your hemoglobin level is in the desired range, to limit the risk of potential side effects and to give you the best benefit. Keep all appointments for any recommended tests. Check your blood pressure as directed. Ask your doctor what your blood pressure should be and when you should contact him or her. As your body makes more red blood cells, you may need to take iron, folic acid, or vitamin B supplements. Ask your doctor or health care provider which products are right for you. If you have kidney disease continue dietary restrictions, even though this medication can make you feel better. Talk with your doctor or health   care professional about the foods you eat and the vitamins that you take. What side effects may I notice from receiving this medicine? Side effects that you should report to your doctor or health care professional as soon as possible: -allergic reactions like skin rash, itching or hives, swelling of the face, lips, or tongue -breathing problems -changes in vision -chest  pain -confusion, trouble speaking or understanding -feeling faint or lightheaded, falls -high blood pressure -muscle aches or pains -pain, swelling, warmth in the leg -rapid weight gain -severe headaches -sudden numbness or weakness of the face, arm or leg -trouble walking, dizziness, loss of balance or coordination -seizures (convulsions) -swelling of the ankles, feet, hands -unusually weak or tired Side effects that usually do not require medical attention (report to your doctor or health care professional if they continue or are bothersome): -diarrhea -fever, chills (flu-like symptoms) -headaches -nausea, vomiting -redness, stinging, or swelling at site where injected This list may not describe all possible side effects. Call your doctor for medical advice about side effects. You may report side effects to FDA at 1-800-FDA-1088. Where should I keep my medicine? Keep out of the reach of children. Store in a refrigerator between 2 and 8 degrees C (36 and 46 degrees F). Do not freeze. Do not shake. Throw away any unused portion if using a single-dose vial. Throw away any unused medicine after the expiration date. NOTE: This sheet is a summary. It may not cover all possible information. If you have questions about this medicine, talk to your doctor, pharmacist, or health care provider.  2015, Elsevier/Gold Standard. (2008-08-28 10:23:57)  

## 2016-11-26 ENCOUNTER — Encounter (INDEPENDENT_AMBULATORY_CARE_PROVIDER_SITE_OTHER): Payer: Self-pay | Admitting: Orthopedic Surgery

## 2016-11-26 ENCOUNTER — Ambulatory Visit (INDEPENDENT_AMBULATORY_CARE_PROVIDER_SITE_OTHER): Payer: 59 | Admitting: Orthopedic Surgery

## 2016-11-26 VITALS — Ht 71.0 in | Wt 161.0 lb

## 2016-11-26 DIAGNOSIS — Z89432 Acquired absence of left foot: Secondary | ICD-10-CM

## 2016-11-26 DIAGNOSIS — E1165 Type 2 diabetes mellitus with hyperglycemia: Secondary | ICD-10-CM

## 2016-11-26 DIAGNOSIS — E118 Type 2 diabetes mellitus with unspecified complications: Secondary | ICD-10-CM | POA: Diagnosis not present
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
## 2016-12-09 DIAGNOSIS — N183 Chronic kidney disease, stage 3 (moderate): Secondary | ICD-10-CM | POA: Diagnosis not present

## 2016-12-09 DIAGNOSIS — Z89432 Acquired absence of left foot: Secondary | ICD-10-CM | POA: Diagnosis not present

## 2016-12-09 DIAGNOSIS — Z794 Long term (current) use of insulin: Secondary | ICD-10-CM | POA: Diagnosis not present

## 2016-12-09 DIAGNOSIS — L97529 Non-pressure chronic ulcer of other part of left foot with unspecified severity: Secondary | ICD-10-CM | POA: Diagnosis not present

## 2016-12-09 DIAGNOSIS — E1165 Type 2 diabetes mellitus with hyperglycemia: Secondary | ICD-10-CM | POA: Diagnosis not present

## 2016-12-09 DIAGNOSIS — Z5181 Encounter for therapeutic drug level monitoring: Secondary | ICD-10-CM | POA: Diagnosis not present

## 2016-12-09 DIAGNOSIS — Z87891 Personal history of nicotine dependence: Secondary | ICD-10-CM | POA: Diagnosis not present

## 2016-12-09 DIAGNOSIS — E1122 Type 2 diabetes mellitus with diabetic chronic kidney disease: Secondary | ICD-10-CM | POA: Diagnosis not present

## 2016-12-15 ENCOUNTER — Ambulatory Visit (HOSPITAL_BASED_OUTPATIENT_CLINIC_OR_DEPARTMENT_OTHER): Payer: 59

## 2016-12-15 ENCOUNTER — Other Ambulatory Visit: Payer: Self-pay | Admitting: *Deleted

## 2016-12-15 ENCOUNTER — Other Ambulatory Visit (HOSPITAL_BASED_OUTPATIENT_CLINIC_OR_DEPARTMENT_OTHER): Payer: 59

## 2016-12-15 VITALS — BP 140/76 | HR 79 | Temp 97.9°F | Resp 18

## 2016-12-15 DIAGNOSIS — D638 Anemia in other chronic diseases classified elsewhere: Secondary | ICD-10-CM

## 2016-12-15 DIAGNOSIS — D631 Anemia in chronic kidney disease: Secondary | ICD-10-CM

## 2016-12-15 DIAGNOSIS — N189 Chronic kidney disease, unspecified: Secondary | ICD-10-CM

## 2016-12-15 LAB — CBC WITH DIFFERENTIAL/PLATELET
BASO%: 0.4 % (ref 0.0–2.0)
Basophils Absolute: 0 10*3/uL (ref 0.0–0.1)
EOS ABS: 0.2 10*3/uL (ref 0.0–0.5)
EOS%: 3.6 % (ref 0.0–7.0)
HCT: 32.5 % — ABNORMAL LOW (ref 38.4–49.9)
HGB: 10.1 g/dL — ABNORMAL LOW (ref 13.0–17.1)
LYMPH%: 20.5 % (ref 14.0–49.0)
MCH: 24 pg — ABNORMAL LOW (ref 27.2–33.4)
MCHC: 31.2 g/dL — ABNORMAL LOW (ref 32.0–36.0)
MCV: 77 fL — AB (ref 79.3–98.0)
MONO#: 0.6 10*3/uL (ref 0.1–0.9)
MONO%: 9.4 % (ref 0.0–14.0)
NEUT%: 66.1 % (ref 39.0–75.0)
NEUTROS ABS: 4 10*3/uL (ref 1.5–6.5)
Platelets: 255 10*3/uL (ref 140–400)
RBC: 4.22 10*6/uL (ref 4.20–5.82)
RDW: 16.8 % — ABNORMAL HIGH (ref 11.0–14.6)
WBC: 6 10*3/uL (ref 4.0–10.3)
lymph#: 1.2 10*3/uL (ref 0.9–3.3)

## 2016-12-15 LAB — TECHNOLOGIST REVIEW

## 2016-12-15 MED ORDER — DARBEPOETIN ALFA 150 MCG/0.3ML IJ SOSY
150.0000 ug | PREFILLED_SYRINGE | Freq: Once | INTRAMUSCULAR | Status: AC
  Administered 2016-12-15: 150 ug via SUBCUTANEOUS
  Filled 2016-12-15: qty 0.3

## 2016-12-15 NOTE — Patient Instructions (Signed)
Darbepoetin Alfa injection What is this medicine? DARBEPOETIN ALFA (dar be POE e tin AL fa) helps your body make more red blood cells. It is used to treat anemia caused by chronic kidney failure and chemotherapy. This medicine may be used for other purposes; ask your health care provider or pharmacist if you have questions. COMMON BRAND NAME(S): Aranesp What should I tell my health care provider before I take this medicine? They need to know if you have any of these conditions: -blood clotting disorders or history of blood clots -cancer patient not on chemotherapy -cystic fibrosis -heart disease, such as angina, heart failure, or a history of a heart attack -hemoglobin level of 12 g/dL or greater -high blood pressure -low levels of folate, iron, or vitamin B12 -seizures -an unusual or allergic reaction to darbepoetin, erythropoietin, albumin, hamster proteins, latex, other medicines, foods, dyes, or preservatives -pregnant or trying to get pregnant -breast-feeding How should I use this medicine? This medicine is for injection into a vein or under the skin. It is usually given by a health care professional in a hospital or clinic setting. If you get this medicine at home, you will be taught how to prepare and give this medicine. Do not shake the solution before you withdraw a dose. Use exactly as directed. Take your medicine at regular intervals. Do not take your medicine more often than directed. It is important that you put your used needles and syringes in a special sharps container. Do not put them in a trash can. If you do not have a sharps container, call your pharmacist or healthcare provider to get one. Talk to your pediatrician regarding the use of this medicine in children. While this medicine may be used in children as young as 1 year for selected conditions, precautions do apply. Overdosage: If you think you have taken too much of this medicine contact a poison control center or  emergency room at once. NOTE: This medicine is only for you. Do not share this medicine with others. What if I miss a dose? If you miss a dose, take it as soon as you can. If it is almost time for your next dose, take only that dose. Do not take double or extra doses. What may interact with this medicine? Do not take this medicine with any of the following medications: -epoetin alfa This list may not describe all possible interactions. Give your health care provider a list of all the medicines, herbs, non-prescription drugs, or dietary supplements you use. Also tell them if you smoke, drink alcohol, or use illegal drugs. Some items may interact with your medicine. What should I watch for while using this medicine? Visit your prescriber or health care professional for regular checks on your progress and for the needed blood tests and blood pressure measurements. It is especially important for the doctor to make sure your hemoglobin level is in the desired range, to limit the risk of potential side effects and to give you the best benefit. Keep all appointments for any recommended tests. Check your blood pressure as directed. Ask your doctor what your blood pressure should be and when you should contact him or her. As your body makes more red blood cells, you may need to take iron, folic acid, or vitamin B supplements. Ask your doctor or health care provider which products are right for you. If you have kidney disease continue dietary restrictions, even though this medication can make you feel better. Talk with your doctor or health   care professional about the foods you eat and the vitamins that you take. What side effects may I notice from receiving this medicine? Side effects that you should report to your doctor or health care professional as soon as possible: -allergic reactions like skin rash, itching or hives, swelling of the face, lips, or tongue -breathing problems -changes in vision -chest  pain -confusion, trouble speaking or understanding -feeling faint or lightheaded, falls -high blood pressure -muscle aches or pains -pain, swelling, warmth in the leg -rapid weight gain -severe headaches -sudden numbness or weakness of the face, arm or leg -trouble walking, dizziness, loss of balance or coordination -seizures (convulsions) -swelling of the ankles, feet, hands -unusually weak or tired Side effects that usually do not require medical attention (report to your doctor or health care professional if they continue or are bothersome): -diarrhea -fever, chills (flu-like symptoms) -headaches -nausea, vomiting -redness, stinging, or swelling at site where injected This list may not describe all possible side effects. Call your doctor for medical advice about side effects. You may report side effects to FDA at 1-800-FDA-1088. Where should I keep my medicine? Keep out of the reach of children. Store in a refrigerator between 2 and 8 degrees C (36 and 46 degrees F). Do not freeze. Do not shake. Throw away any unused portion if using a single-dose vial. Throw away any unused medicine after the expiration date. NOTE: This sheet is a summary. It may not cover all possible information. If you have questions about this medicine, talk to your doctor, pharmacist, or health care provider.  2015, Elsevier/Gold Standard. (2008-08-28 10:23:57)  

## 2016-12-23 ENCOUNTER — Encounter (INDEPENDENT_AMBULATORY_CARE_PROVIDER_SITE_OTHER): Payer: Self-pay | Admitting: Family

## 2016-12-23 ENCOUNTER — Ambulatory Visit (INDEPENDENT_AMBULATORY_CARE_PROVIDER_SITE_OTHER): Payer: Medicare Other | Admitting: Orthopedic Surgery

## 2016-12-23 ENCOUNTER — Ambulatory Visit (INDEPENDENT_AMBULATORY_CARE_PROVIDER_SITE_OTHER): Payer: 59 | Admitting: Family

## 2016-12-23 DIAGNOSIS — L97521 Non-pressure chronic ulcer of other part of left foot limited to breakdown of skin: Secondary | ICD-10-CM

## 2016-12-23 DIAGNOSIS — E1165 Type 2 diabetes mellitus with hyperglycemia: Secondary | ICD-10-CM

## 2016-12-23 DIAGNOSIS — E118 Type 2 diabetes mellitus with unspecified complications: Secondary | ICD-10-CM | POA: Diagnosis not present
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
## 2017-01-07 ENCOUNTER — Other Ambulatory Visit: Payer: Self-pay | Admitting: *Deleted

## 2017-01-07 MED ORDER — INTEGRA PLUS PO CAPS: 1.0000 | ORAL_CAPSULE | Freq: Every day | ORAL | 3 refills | Status: DC

## 2017-01-12 ENCOUNTER — Other Ambulatory Visit (HOSPITAL_BASED_OUTPATIENT_CLINIC_OR_DEPARTMENT_OTHER): Payer: 59

## 2017-01-12 ENCOUNTER — Ambulatory Visit (HOSPITAL_BASED_OUTPATIENT_CLINIC_OR_DEPARTMENT_OTHER): Payer: 59

## 2017-01-12 ENCOUNTER — Ambulatory Visit: Payer: Medicare Other

## 2017-01-12 DIAGNOSIS — D631 Anemia in chronic kidney disease: Secondary | ICD-10-CM

## 2017-01-12 DIAGNOSIS — N189 Chronic kidney disease, unspecified: Secondary | ICD-10-CM

## 2017-01-12 DIAGNOSIS — D638 Anemia in other chronic diseases classified elsewhere: Secondary | ICD-10-CM

## 2017-01-12 LAB — CBC WITH DIFFERENTIAL/PLATELET
BASO%: 0.4 % (ref 0.0–2.0)
BASOS ABS: 0 10*3/uL (ref 0.0–0.1)
EOS ABS: 0.3 10*3/uL (ref 0.0–0.5)
EOS%: 4.7 % (ref 0.0–7.0)
HEMATOCRIT: 35.7 % — AB (ref 38.4–49.9)
HGB: 11.1 g/dL — ABNORMAL LOW (ref 13.0–17.1)
LYMPH%: 21.4 % (ref 14.0–49.0)
MCH: 24 pg — ABNORMAL LOW (ref 27.2–33.4)
MCHC: 31.1 g/dL — AB (ref 32.0–36.0)
MCV: 77.1 fL — AB (ref 79.3–98.0)
MONO#: 0.5 10*3/uL (ref 0.1–0.9)
MONO%: 8.5 % (ref 0.0–14.0)
NEUT#: 3.7 10*3/uL (ref 1.5–6.5)
NEUT%: 65 % (ref 39.0–75.0)
PLATELETS: 254 10*3/uL (ref 140–400)
RBC: 4.62 10*6/uL (ref 4.20–5.82)
RDW: 16.1 % — ABNORMAL HIGH (ref 11.0–14.6)
WBC: 5.7 10*3/uL (ref 4.0–10.3)
lymph#: 1.2 10*3/uL (ref 0.9–3.3)

## 2017-01-12 LAB — IRON AND TIBC
%SAT: 34 % (ref 20–55)
Iron: 75 ug/dL (ref 42–163)
TIBC: 220 ug/dL (ref 202–409)
UIBC: 145 ug/dL (ref 117–376)

## 2017-01-12 LAB — FERRITIN: FERRITIN: 488 ng/mL — AB (ref 22–316)

## 2017-01-12 NOTE — Progress Notes (Signed)
Hemoglobin at 11.1 on lab today.  No injection needed per order protocol. Copy of labs and schedule given to pt and family member. Pt instructed to follow schedule and call office should issues occur. Pt and family member verbalized understanding of instructions.

## 2017-01-20 ENCOUNTER — Encounter (INDEPENDENT_AMBULATORY_CARE_PROVIDER_SITE_OTHER): Payer: Self-pay | Admitting: Family

## 2017-01-20 ENCOUNTER — Ambulatory Visit (INDEPENDENT_AMBULATORY_CARE_PROVIDER_SITE_OTHER): Payer: 59 | Admitting: Family

## 2017-01-20 DIAGNOSIS — L97521 Non-pressure chronic ulcer of other part of left foot limited to breakdown of skin: Secondary | ICD-10-CM | POA: Diagnosis not present
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
## 2017-01-28 ENCOUNTER — Other Ambulatory Visit: Payer: Self-pay | Admitting: Cardiology

## 2017-02-09 ENCOUNTER — Other Ambulatory Visit (HOSPITAL_BASED_OUTPATIENT_CLINIC_OR_DEPARTMENT_OTHER): Payer: 59

## 2017-02-09 ENCOUNTER — Ambulatory Visit (HOSPITAL_BASED_OUTPATIENT_CLINIC_OR_DEPARTMENT_OTHER): Payer: 59

## 2017-02-09 VITALS — BP 149/87 | HR 88 | Temp 97.1°F | Resp 20

## 2017-02-09 DIAGNOSIS — D631 Anemia in chronic kidney disease: Secondary | ICD-10-CM

## 2017-02-09 DIAGNOSIS — N189 Chronic kidney disease, unspecified: Secondary | ICD-10-CM

## 2017-02-09 DIAGNOSIS — D638 Anemia in other chronic diseases classified elsewhere: Secondary | ICD-10-CM

## 2017-02-09 LAB — CBC WITH DIFFERENTIAL/PLATELET
BASO%: 0.5 % (ref 0.0–2.0)
BASOS ABS: 0 10*3/uL (ref 0.0–0.1)
EOS%: 3.4 % (ref 0.0–7.0)
Eosinophils Absolute: 0.2 10*3/uL (ref 0.0–0.5)
HEMATOCRIT: 33.2 % — AB (ref 38.4–49.9)
HGB: 10.5 g/dL — ABNORMAL LOW (ref 13.0–17.1)
LYMPH%: 22.2 % (ref 14.0–49.0)
MCH: 24.2 pg — AB (ref 27.2–33.4)
MCHC: 31.5 g/dL — AB (ref 32.0–36.0)
MCV: 76.9 fL — ABNORMAL LOW (ref 79.3–98.0)
MONO#: 0.7 10*3/uL (ref 0.1–0.9)
MONO%: 9.5 % (ref 0.0–14.0)
NEUT#: 4.7 10*3/uL (ref 1.5–6.5)
NEUT%: 64.4 % (ref 39.0–75.0)
Platelets: 265 10*3/uL (ref 140–400)
RBC: 4.32 10*6/uL (ref 4.20–5.82)
RDW: 16 % — ABNORMAL HIGH (ref 11.0–14.6)
WBC: 7.3 10*3/uL (ref 4.0–10.3)
lymph#: 1.6 10*3/uL (ref 0.9–3.3)

## 2017-02-09 LAB — COMPREHENSIVE METABOLIC PANEL
ALT: 29 U/L (ref 0–55)
ANION GAP: 9 meq/L (ref 3–11)
AST: 17 U/L (ref 5–34)
Albumin: 3.4 g/dL — ABNORMAL LOW (ref 3.5–5.0)
Alkaline Phosphatase: 108 U/L (ref 40–150)
BUN: 23.3 mg/dL (ref 7.0–26.0)
CALCIUM: 9.4 mg/dL (ref 8.4–10.4)
CO2: 25 mEq/L (ref 22–29)
Chloride: 106 mEq/L (ref 98–109)
Creatinine: 1.9 mg/dL — ABNORMAL HIGH (ref 0.7–1.3)
EGFR: 44 mL/min/{1.73_m2} — ABNORMAL LOW (ref >90)
Glucose: 186 mg/dl — ABNORMAL HIGH (ref 70–140)
POTASSIUM: 4.4 meq/L (ref 3.5–5.1)
Sodium: 141 mEq/L (ref 136–145)
Total Bilirubin: 0.49 mg/dL (ref 0.20–1.20)
Total Protein: 7.6 g/dL (ref 6.4–8.3)

## 2017-02-09 MED ORDER — DARBEPOETIN ALFA 150 MCG/0.3ML IJ SOSY
150.0000 ug | PREFILLED_SYRINGE | Freq: Once | INTRAMUSCULAR | Status: AC
  Administered 2017-02-09: 150 ug via SUBCUTANEOUS
  Filled 2017-02-09: qty 0.3

## 2017-02-09 NOTE — Patient Instructions (Signed)
Darbepoetin Alfa injection What is this medicine? DARBEPOETIN ALFA (dar be POE e tin AL fa) helps your body make more red blood cells. It is used to treat anemia caused by chronic kidney failure and chemotherapy. This medicine may be used for other purposes; ask your health care provider or pharmacist if you have questions. COMMON BRAND NAME(S): Aranesp What should I tell my health care provider before I take this medicine? They need to know if you have any of these conditions: -blood clotting disorders or history of blood clots -cancer patient not on chemotherapy -cystic fibrosis -heart disease, such as angina, heart failure, or a history of a heart attack -hemoglobin level of 12 g/dL or greater -high blood pressure -low levels of folate, iron, or vitamin B12 -seizures -an unusual or allergic reaction to darbepoetin, erythropoietin, albumin, hamster proteins, latex, other medicines, foods, dyes, or preservatives -pregnant or trying to get pregnant -breast-feeding How should I use this medicine? This medicine is for injection into a vein or under the skin. It is usually given by a health care professional in a hospital or clinic setting. If you get this medicine at home, you will be taught how to prepare and give this medicine. Do not shake the solution before you withdraw a dose. Use exactly as directed. Take your medicine at regular intervals. Do not take your medicine more often than directed. It is important that you put your used needles and syringes in a special sharps container. Do not put them in a trash can. If you do not have a sharps container, call your pharmacist or healthcare provider to get one. Talk to your pediatrician regarding the use of this medicine in children. While this medicine may be used in children as young as 1 year for selected conditions, precautions do apply. Overdosage: If you think you have taken too much of this medicine contact a poison control center or  emergency room at once. NOTE: This medicine is only for you. Do not share this medicine with others. What if I miss a dose? If you miss a dose, take it as soon as you can. If it is almost time for your next dose, take only that dose. Do not take double or extra doses. What may interact with this medicine? Do not take this medicine with any of the following medications: -epoetin alfa This list may not describe all possible interactions. Give your health care provider a list of all the medicines, herbs, non-prescription drugs, or dietary supplements you use. Also tell them if you smoke, drink alcohol, or use illegal drugs. Some items may interact with your medicine. What should I watch for while using this medicine? Visit your prescriber or health care professional for regular checks on your progress and for the needed blood tests and blood pressure measurements. It is especially important for the doctor to make sure your hemoglobin level is in the desired range, to limit the risk of potential side effects and to give you the best benefit. Keep all appointments for any recommended tests. Check your blood pressure as directed. Ask your doctor what your blood pressure should be and when you should contact him or her. As your body makes more red blood cells, you may need to take iron, folic acid, or vitamin B supplements. Ask your doctor or health care provider which products are right for you. If you have kidney disease continue dietary restrictions, even though this medication can make you feel better. Talk with your doctor or health   care professional about the foods you eat and the vitamins that you take. What side effects may I notice from receiving this medicine? Side effects that you should report to your doctor or health care professional as soon as possible: -allergic reactions like skin rash, itching or hives, swelling of the face, lips, or tongue -breathing problems -changes in vision -chest  pain -confusion, trouble speaking or understanding -feeling faint or lightheaded, falls -high blood pressure -muscle aches or pains -pain, swelling, warmth in the leg -rapid weight gain -severe headaches -sudden numbness or weakness of the face, arm or leg -trouble walking, dizziness, loss of balance or coordination -seizures (convulsions) -swelling of the ankles, feet, hands -unusually weak or tired Side effects that usually do not require medical attention (report to your doctor or health care professional if they continue or are bothersome): -diarrhea -fever, chills (flu-like symptoms) -headaches -nausea, vomiting -redness, stinging, or swelling at site where injected This list may not describe all possible side effects. Call your doctor for medical advice about side effects. You may report side effects to FDA at 1-800-FDA-1088. Where should I keep my medicine? Keep out of the reach of children. Store in a refrigerator between 2 and 8 degrees C (36 and 46 degrees F). Do not freeze. Do not shake. Throw away any unused portion if using a single-dose vial. Throw away any unused medicine after the expiration date. NOTE: This sheet is a summary. It may not cover all possible information. If you have questions about this medicine, talk to your doctor, pharmacist, or health care provider.  2015, Elsevier/Gold Standard. (2008-08-28 10:23:57)  

## 2017-02-17 ENCOUNTER — Other Ambulatory Visit: Payer: Self-pay | Admitting: Cardiology

## 2017-02-17 ENCOUNTER — Ambulatory Visit (INDEPENDENT_AMBULATORY_CARE_PROVIDER_SITE_OTHER): Payer: Medicare Other | Admitting: Family

## 2017-02-17 NOTE — Telephone Encounter (Signed)
REFILL 

## 2017-02-18 ENCOUNTER — Encounter (INDEPENDENT_AMBULATORY_CARE_PROVIDER_SITE_OTHER): Payer: Self-pay | Admitting: Family

## 2017-02-18 ENCOUNTER — Ambulatory Visit (INDEPENDENT_AMBULATORY_CARE_PROVIDER_SITE_OTHER): Payer: 59 | Admitting: Family

## 2017-02-18 VITALS — Ht 71.0 in | Wt 161.0 lb

## 2017-02-18 DIAGNOSIS — L97521 Non-pressure chronic ulcer of other part of left foot limited to breakdown of skin: Secondary | ICD-10-CM

## 2017-02-18 DIAGNOSIS — E118 Type 2 diabetes mellitus with unspecified complications: Secondary | ICD-10-CM | POA: Diagnosis not present

## 2017-02-18 DIAGNOSIS — Z89432 Acquired absence of left foot: Secondary | ICD-10-CM

## 2017-02-18 DIAGNOSIS — E1165 Type 2 diabetes mellitus with hyperglycemia: Secondary | ICD-10-CM | POA: Diagnosis not present
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
## 2017-02-25 ENCOUNTER — Other Ambulatory Visit: Payer: Self-pay | Admitting: Cardiology

## 2017-02-25 NOTE — Telephone Encounter (Signed)
REFILL 

## 2017-03-09 ENCOUNTER — Ambulatory Visit (HOSPITAL_BASED_OUTPATIENT_CLINIC_OR_DEPARTMENT_OTHER): Payer: 59

## 2017-03-09 ENCOUNTER — Other Ambulatory Visit (HOSPITAL_BASED_OUTPATIENT_CLINIC_OR_DEPARTMENT_OTHER): Payer: 59

## 2017-03-09 VITALS — BP 148/74 | HR 82 | Temp 97.5°F | Resp 18

## 2017-03-09 DIAGNOSIS — D638 Anemia in other chronic diseases classified elsewhere: Secondary | ICD-10-CM

## 2017-03-09 DIAGNOSIS — D631 Anemia in chronic kidney disease: Secondary | ICD-10-CM | POA: Diagnosis not present

## 2017-03-09 DIAGNOSIS — N189 Chronic kidney disease, unspecified: Secondary | ICD-10-CM

## 2017-03-09 LAB — CBC WITH DIFFERENTIAL/PLATELET
BASO%: 0.2 % (ref 0.0–2.0)
BASOS ABS: 0 10*3/uL (ref 0.0–0.1)
EOS%: 4.6 % (ref 0.0–7.0)
Eosinophils Absolute: 0.3 10*3/uL (ref 0.0–0.5)
HCT: 32.3 % — ABNORMAL LOW (ref 38.4–49.9)
HEMOGLOBIN: 9.8 g/dL — AB (ref 13.0–17.1)
LYMPH%: 22.5 % (ref 14.0–49.0)
MCH: 23.6 pg — AB (ref 27.2–33.4)
MCHC: 30.3 g/dL — AB (ref 32.0–36.0)
MCV: 77.6 fL — AB (ref 79.3–98.0)
MONO#: 0.4 10*3/uL (ref 0.1–0.9)
MONO%: 7.3 % (ref 0.0–14.0)
NEUT#: 4 10*3/uL (ref 1.5–6.5)
NEUT%: 65.4 % (ref 39.0–75.0)
Platelets: 285 10*3/uL (ref 140–400)
RBC: 4.16 10*6/uL — AB (ref 4.20–5.82)
RDW: 15.7 % — ABNORMAL HIGH (ref 11.0–14.6)
WBC: 6.1 10*3/uL (ref 4.0–10.3)
lymph#: 1.4 10*3/uL (ref 0.9–3.3)
nRBC: 0 % (ref 0–0)

## 2017-03-09 MED ORDER — DARBEPOETIN ALFA 150 MCG/0.3ML IJ SOSY
150.0000 ug | PREFILLED_SYRINGE | Freq: Once | INTRAMUSCULAR | Status: AC
  Administered 2017-03-09: 150 ug via SUBCUTANEOUS
  Filled 2017-03-09: qty 0.3

## 2017-03-09 NOTE — Patient Instructions (Signed)
Darbepoetin Alfa injection What is this medicine? DARBEPOETIN ALFA (dar be POE e tin AL fa) helps your body make more red blood cells. It is used to treat anemia caused by chronic kidney failure and chemotherapy. This medicine may be used for other purposes; ask your health care provider or pharmacist if you have questions. COMMON BRAND NAME(S): Aranesp What should I tell my health care provider before I take this medicine? They need to know if you have any of these conditions: -blood clotting disorders or history of blood clots -cancer patient not on chemotherapy -cystic fibrosis -heart disease, such as angina, heart failure, or a history of a heart attack -hemoglobin level of 12 g/dL or greater -high blood pressure -low levels of folate, iron, or vitamin B12 -seizures -an unusual or allergic reaction to darbepoetin, erythropoietin, albumin, hamster proteins, latex, other medicines, foods, dyes, or preservatives -pregnant or trying to get pregnant -breast-feeding How should I use this medicine? This medicine is for injection into a vein or under the skin. It is usually given by a health care professional in a hospital or clinic setting. If you get this medicine at home, you will be taught how to prepare and give this medicine. Do not shake the solution before you withdraw a dose. Use exactly as directed. Take your medicine at regular intervals. Do not take your medicine more often than directed. It is important that you put your used needles and syringes in a special sharps container. Do not put them in a trash can. If you do not have a sharps container, call your pharmacist or healthcare provider to get one. Talk to your pediatrician regarding the use of this medicine in children. While this medicine may be used in children as young as 1 year for selected conditions, precautions do apply. Overdosage: If you think you have taken too much of this medicine contact a poison control center or  emergency room at once. NOTE: This medicine is only for you. Do not share this medicine with others. What if I miss a dose? If you miss a dose, take it as soon as you can. If it is almost time for your next dose, take only that dose. Do not take double or extra doses. What may interact with this medicine? Do not take this medicine with any of the following medications: -epoetin alfa This list may not describe all possible interactions. Give your health care provider a list of all the medicines, herbs, non-prescription drugs, or dietary supplements you use. Also tell them if you smoke, drink alcohol, or use illegal drugs. Some items may interact with your medicine. What should I watch for while using this medicine? Visit your prescriber or health care professional for regular checks on your progress and for the needed blood tests and blood pressure measurements. It is especially important for the doctor to make sure your hemoglobin level is in the desired range, to limit the risk of potential side effects and to give you the best benefit. Keep all appointments for any recommended tests. Check your blood pressure as directed. Ask your doctor what your blood pressure should be and when you should contact him or her. As your body makes more red blood cells, you may need to take iron, folic acid, or vitamin B supplements. Ask your doctor or health care provider which products are right for you. If you have kidney disease continue dietary restrictions, even though this medication can make you feel better. Talk with your doctor or health   care professional about the foods you eat and the vitamins that you take. What side effects may I notice from receiving this medicine? Side effects that you should report to your doctor or health care professional as soon as possible: -allergic reactions like skin rash, itching or hives, swelling of the face, lips, or tongue -breathing problems -changes in vision -chest  pain -confusion, trouble speaking or understanding -feeling faint or lightheaded, falls -high blood pressure -muscle aches or pains -pain, swelling, warmth in the leg -rapid weight gain -severe headaches -sudden numbness or weakness of the face, arm or leg -trouble walking, dizziness, loss of balance or coordination -seizures (convulsions) -swelling of the ankles, feet, hands -unusually weak or tired Side effects that usually do not require medical attention (report to your doctor or health care professional if they continue or are bothersome): -diarrhea -fever, chills (flu-like symptoms) -headaches -nausea, vomiting -redness, stinging, or swelling at site where injected This list may not describe all possible side effects. Call your doctor for medical advice about side effects. You may report side effects to FDA at 1-800-FDA-1088. Where should I keep my medicine? Keep out of the reach of children. Store in a refrigerator between 2 and 8 degrees C (36 and 46 degrees F). Do not freeze. Do not shake. Throw away any unused portion if using a single-dose vial. Throw away any unused medicine after the expiration date. NOTE: This sheet is a summary. It may not cover all possible information. If you have questions about this medicine, talk to your doctor, pharmacist, or health care provider.  2015, Elsevier/Gold Standard. (2008-08-28 10:23:57)  

## 2017-03-25 ENCOUNTER — Other Ambulatory Visit: Payer: Self-pay | Admitting: Cardiology

## 2017-04-01 ENCOUNTER — Encounter (INDEPENDENT_AMBULATORY_CARE_PROVIDER_SITE_OTHER): Payer: Self-pay | Admitting: Family

## 2017-04-01 ENCOUNTER — Ambulatory Visit (INDEPENDENT_AMBULATORY_CARE_PROVIDER_SITE_OTHER): Payer: 59 | Admitting: Orthopedic Surgery

## 2017-04-01 VITALS — Ht 71.0 in | Wt 161.0 lb

## 2017-04-01 DIAGNOSIS — L97521 Non-pressure chronic ulcer of other part of left foot limited to breakdown of skin: Secondary | ICD-10-CM

## 2017-04-01 DIAGNOSIS — E118 Type 2 diabetes mellitus with unspecified complications: Secondary | ICD-10-CM | POA: Diagnosis not present

## 2017-04-01 DIAGNOSIS — E1165 Type 2 diabetes mellitus with hyperglycemia: Secondary | ICD-10-CM
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
## 2017-04-06 ENCOUNTER — Other Ambulatory Visit (HOSPITAL_BASED_OUTPATIENT_CLINIC_OR_DEPARTMENT_OTHER): Payer: 59

## 2017-04-06 ENCOUNTER — Ambulatory Visit (HOSPITAL_BASED_OUTPATIENT_CLINIC_OR_DEPARTMENT_OTHER): Payer: 59

## 2017-04-06 VITALS — BP 150/80 | HR 83 | Temp 97.1°F

## 2017-04-06 DIAGNOSIS — N189 Chronic kidney disease, unspecified: Secondary | ICD-10-CM | POA: Diagnosis not present

## 2017-04-06 DIAGNOSIS — D638 Anemia in other chronic diseases classified elsewhere: Secondary | ICD-10-CM

## 2017-04-06 DIAGNOSIS — D631 Anemia in chronic kidney disease: Secondary | ICD-10-CM

## 2017-04-06 LAB — CBC WITH DIFFERENTIAL/PLATELET
BASO%: 0.3 % (ref 0.0–2.0)
BASOS ABS: 0 10*3/uL (ref 0.0–0.1)
EOS%: 4.2 % (ref 0.0–7.0)
Eosinophils Absolute: 0.3 10*3/uL (ref 0.0–0.5)
HEMATOCRIT: 34.9 % — AB (ref 38.4–49.9)
HGB: 10.5 g/dL — ABNORMAL LOW (ref 13.0–17.1)
LYMPH#: 1.5 10*3/uL (ref 0.9–3.3)
LYMPH%: 22.8 % (ref 14.0–49.0)
MCH: 23.8 pg — AB (ref 27.2–33.4)
MCHC: 30.1 g/dL — AB (ref 32.0–36.0)
MCV: 79 fL — ABNORMAL LOW (ref 79.3–98.0)
MONO#: 0.6 10*3/uL (ref 0.1–0.9)
MONO%: 8.3 % (ref 0.0–14.0)
NEUT#: 4.3 10*3/uL (ref 1.5–6.5)
NEUT%: 64.4 % (ref 39.0–75.0)
Platelets: 270 10*3/uL (ref 140–400)
RBC: 4.42 10*6/uL (ref 4.20–5.82)
RDW: 15.8 % — ABNORMAL HIGH (ref 11.0–14.6)
WBC: 6.7 10*3/uL (ref 4.0–10.3)

## 2017-04-06 MED ORDER — DARBEPOETIN ALFA 150 MCG/0.3ML IJ SOSY
150.0000 ug | PREFILLED_SYRINGE | Freq: Once | INTRAMUSCULAR | Status: AC
  Administered 2017-04-06: 150 ug via SUBCUTANEOUS
  Filled 2017-04-06: qty 0.3

## 2017-04-06 NOTE — Patient Instructions (Signed)
Darbepoetin Alfa injection What is this medicine? DARBEPOETIN ALFA (dar be POE e tin AL fa) helps your body make more red blood cells. It is used to treat anemia caused by chronic kidney failure and chemotherapy. This medicine may be used for other purposes; ask your health care provider or pharmacist if you have questions. COMMON BRAND NAME(S): Aranesp What should I tell my health care provider before I take this medicine? They need to know if you have any of these conditions: -blood clotting disorders or history of blood clots -cancer patient not on chemotherapy -cystic fibrosis -heart disease, such as angina, heart failure, or a history of a heart attack -hemoglobin level of 12 g/dL or greater -high blood pressure -low levels of folate, iron, or vitamin B12 -seizures -an unusual or allergic reaction to darbepoetin, erythropoietin, albumin, hamster proteins, latex, other medicines, foods, dyes, or preservatives -pregnant or trying to get pregnant -breast-feeding How should I use this medicine? This medicine is for injection into a vein or under the skin. It is usually given by a health care professional in a hospital or clinic setting. If you get this medicine at home, you will be taught how to prepare and give this medicine. Do not shake the solution before you withdraw a dose. Use exactly as directed. Take your medicine at regular intervals. Do not take your medicine more often than directed. It is important that you put your used needles and syringes in a special sharps container. Do not put them in a trash can. If you do not have a sharps container, call your pharmacist or healthcare provider to get one. Talk to your pediatrician regarding the use of this medicine in children. While this medicine may be used in children as young as 1 year for selected conditions, precautions do apply. Overdosage: If you think you have taken too much of this medicine contact a poison control center or  emergency room at once. NOTE: This medicine is only for you. Do not share this medicine with others. What if I miss a dose? If you miss a dose, take it as soon as you can. If it is almost time for your next dose, take only that dose. Do not take double or extra doses. What may interact with this medicine? Do not take this medicine with any of the following medications: -epoetin alfa This list may not describe all possible interactions. Give your health care provider a list of all the medicines, herbs, non-prescription drugs, or dietary supplements you use. Also tell them if you smoke, drink alcohol, or use illegal drugs. Some items may interact with your medicine. What should I watch for while using this medicine? Visit your prescriber or health care professional for regular checks on your progress and for the needed blood tests and blood pressure measurements. It is especially important for the doctor to make sure your hemoglobin level is in the desired range, to limit the risk of potential side effects and to give you the best benefit. Keep all appointments for any recommended tests. Check your blood pressure as directed. Ask your doctor what your blood pressure should be and when you should contact him or her. As your body makes more red blood cells, you may need to take iron, folic acid, or vitamin B supplements. Ask your doctor or health care provider which products are right for you. If you have kidney disease continue dietary restrictions, even though this medication can make you feel better. Talk with your doctor or health   care professional about the foods you eat and the vitamins that you take. What side effects may I notice from receiving this medicine? Side effects that you should report to your doctor or health care professional as soon as possible: -allergic reactions like skin rash, itching or hives, swelling of the face, lips, or tongue -breathing problems -changes in vision -chest  pain -confusion, trouble speaking or understanding -feeling faint or lightheaded, falls -high blood pressure -muscle aches or pains -pain, swelling, warmth in the leg -rapid weight gain -severe headaches -sudden numbness or weakness of the face, arm or leg -trouble walking, dizziness, loss of balance or coordination -seizures (convulsions) -swelling of the ankles, feet, hands -unusually weak or tired Side effects that usually do not require medical attention (report to your doctor or health care professional if they continue or are bothersome): -diarrhea -fever, chills (flu-like symptoms) -headaches -nausea, vomiting -redness, stinging, or swelling at site where injected This list may not describe all possible side effects. Call your doctor for medical advice about side effects. You may report side effects to FDA at 1-800-FDA-1088. Where should I keep my medicine? Keep out of the reach of children. Store in a refrigerator between 2 and 8 degrees C (36 and 46 degrees F). Do not freeze. Do not shake. Throw away any unused portion if using a single-dose vial. Throw away any unused medicine after the expiration date. NOTE: This sheet is a summary. It may not cover all possible information. If you have questions about this medicine, talk to your doctor, pharmacist, or health care provider.  2015, Elsevier/Gold Standard. (2008-08-28 10:23:57)  

## 2017-04-08 ENCOUNTER — Telehealth (INDEPENDENT_AMBULATORY_CARE_PROVIDER_SITE_OTHER): Payer: Self-pay

## 2017-04-08 NOTE — Telephone Encounter (Signed)
Christy with [facility] would like to know if a prior authorization had been received for Diclofenac Gel for patient.  CB# is [phone].  Please Advise.  Thank You.

## 2017-04-09 NOTE — Telephone Encounter (Signed)
Denial will be faxed to pharm. This med was not written by Dr. Lajoyce Corners.

## 2017-04-12 ENCOUNTER — Telehealth (INDEPENDENT_AMBULATORY_CARE_PROVIDER_SITE_OTHER): Payer: Self-pay | Admitting: Radiology

## 2017-04-12 MED ORDER — MUPIROCIN 2 % EX OINT: TOPICAL_OINTMENT | CUTANEOUS | 5 refills | Status: DC

## 2017-04-12 NOTE — Telephone Encounter (Signed)
Received rx request from [facility] for bactroban ointment. Ok to refill per EZ. Rx sent to pharmacy, since prior office note advises to continue with dressing changes.
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
## 2017-04-19 ENCOUNTER — Ambulatory Visit (HOSPITAL_BASED_OUTPATIENT_CLINIC_OR_DEPARTMENT_OTHER): Payer: 59 | Admitting: Hematology

## 2017-04-19 ENCOUNTER — Telehealth: Payer: Self-pay | Admitting: Hematology

## 2017-04-19 ENCOUNTER — Encounter: Payer: Self-pay | Admitting: Hematology

## 2017-04-19 VITALS — BP 164/83 | HR 89 | Temp 97.8°F | Resp 18 | Ht 71.0 in | Wt 161.3 lb

## 2017-04-19 DIAGNOSIS — D508 Other iron deficiency anemias: Secondary | ICD-10-CM | POA: Diagnosis not present

## 2017-04-19 DIAGNOSIS — I10 Essential (primary) hypertension: Secondary | ICD-10-CM | POA: Diagnosis not present

## 2017-04-19 DIAGNOSIS — E119 Type 2 diabetes mellitus without complications: Secondary | ICD-10-CM | POA: Diagnosis not present

## 2017-04-19 DIAGNOSIS — N189 Chronic kidney disease, unspecified: Secondary | ICD-10-CM

## 2017-04-19 DIAGNOSIS — E118 Type 2 diabetes mellitus with unspecified complications: Secondary | ICD-10-CM

## 2017-04-19 DIAGNOSIS — I739 Peripheral vascular disease, unspecified: Secondary | ICD-10-CM

## 2017-04-19 DIAGNOSIS — D638 Anemia in other chronic diseases classified elsewhere: Secondary | ICD-10-CM

## 2017-04-19 DIAGNOSIS — E1165 Type 2 diabetes mellitus with hyperglycemia: Secondary | ICD-10-CM

## 2017-04-19 NOTE — Telephone Encounter (Signed)
Scheduled appt per 7/23 los - Gave patient AVS and calender per los.  

## 2017-04-22 ENCOUNTER — Ambulatory Visit (INDEPENDENT_AMBULATORY_CARE_PROVIDER_SITE_OTHER): Payer: Medicare Other | Admitting: Orthopedic Surgery

## 2017-04-22 ENCOUNTER — Other Ambulatory Visit: Payer: Self-pay | Admitting: Cardiology

## 2017-04-29 DIAGNOSIS — I739 Peripheral vascular disease, unspecified: Secondary | ICD-10-CM | POA: Diagnosis not present

## 2017-04-29 DIAGNOSIS — N183 Chronic kidney disease, stage 3 (moderate): Secondary | ICD-10-CM | POA: Diagnosis not present

## 2017-04-29 DIAGNOSIS — D631 Anemia in chronic kidney disease: Secondary | ICD-10-CM | POA: Diagnosis not present

## 2017-04-29 DIAGNOSIS — I129 Hypertensive chronic kidney disease with stage 1 through stage 4 chronic kidney disease, or unspecified chronic kidney disease: Secondary | ICD-10-CM | POA: Diagnosis not present

## 2017-05-03 ENCOUNTER — Other Ambulatory Visit (HOSPITAL_BASED_OUTPATIENT_CLINIC_OR_DEPARTMENT_OTHER): Payer: 59

## 2017-05-03 ENCOUNTER — Ambulatory Visit (INDEPENDENT_AMBULATORY_CARE_PROVIDER_SITE_OTHER): Payer: Medicare Other | Admitting: Orthopedic Surgery

## 2017-05-03 ENCOUNTER — Ambulatory Visit: Payer: Medicare Other

## 2017-05-03 DIAGNOSIS — N189 Chronic kidney disease, unspecified: Secondary | ICD-10-CM

## 2017-05-03 DIAGNOSIS — D638 Anemia in other chronic diseases classified elsewhere: Secondary | ICD-10-CM

## 2017-05-03 DIAGNOSIS — D631 Anemia in chronic kidney disease: Secondary | ICD-10-CM | POA: Diagnosis not present

## 2017-05-03 LAB — CBC WITH DIFFERENTIAL/PLATELET
BASO%: 0.5 % (ref 0.0–2.0)
BASOS ABS: 0 10*3/uL (ref 0.0–0.1)
EOS%: 2.9 % (ref 0.0–7.0)
Eosinophils Absolute: 0.2 10*3/uL (ref 0.0–0.5)
HEMATOCRIT: 38.2 % — AB (ref 38.4–49.9)
HGB: 11.7 g/dL — ABNORMAL LOW (ref 13.0–17.1)
LYMPH#: 1.3 10*3/uL (ref 0.9–3.3)
LYMPH%: 19.4 % (ref 14.0–49.0)
MCH: 23.4 pg — AB (ref 27.2–33.4)
MCHC: 30.6 g/dL — AB (ref 32.0–36.0)
MCV: 76.3 fL — ABNORMAL LOW (ref 79.3–98.0)
MONO#: 0.5 10*3/uL (ref 0.1–0.9)
MONO%: 7.7 % (ref 0.0–14.0)
NEUT#: 4.6 10*3/uL (ref 1.5–6.5)
NEUT%: 69.5 % (ref 39.0–75.0)
Platelets: 220 10*3/uL (ref 140–400)
RBC: 5.01 10*6/uL (ref 4.20–5.82)
RDW: 17.2 % — AB (ref 11.0–14.6)
WBC: 6.7 10*3/uL (ref 4.0–10.3)

## 2017-05-03 LAB — COMPREHENSIVE METABOLIC PANEL
ALBUMIN: 3.2 g/dL — AB (ref 3.5–5.0)
ALK PHOS: 105 U/L (ref 40–150)
ALT: 31 U/L (ref 0–55)
AST: 35 U/L — ABNORMAL HIGH (ref 5–34)
Anion Gap: 7 mEq/L (ref 3–11)
BILIRUBIN TOTAL: 0.45 mg/dL (ref 0.20–1.20)
BUN: 23.1 mg/dL (ref 7.0–26.0)
CO2: 24 meq/L (ref 22–29)
CREATININE: 2.1 mg/dL — AB (ref 0.7–1.3)
Calcium: 8.9 mg/dL (ref 8.4–10.4)
Chloride: 105 mEq/L (ref 98–109)
EGFR: 39 mL/min/{1.73_m2} — AB (ref >90)
Glucose: 224 mg/dl — ABNORMAL HIGH (ref 70–140)
Potassium: 4.7 mEq/L (ref 3.5–5.1)
Sodium: 136 mEq/L (ref 136–145)
TOTAL PROTEIN: 7.3 g/dL (ref 6.4–8.3)

## 2017-05-03 NOTE — Progress Notes (Signed)
Hemoglobin noted at 11.6 today. No injection needed per order. Pt given copy of current labs and current schedule

## 2017-05-06 ENCOUNTER — Encounter (INDEPENDENT_AMBULATORY_CARE_PROVIDER_SITE_OTHER): Payer: Self-pay | Admitting: Orthopedic Surgery

## 2017-05-06 ENCOUNTER — Ambulatory Visit (INDEPENDENT_AMBULATORY_CARE_PROVIDER_SITE_OTHER): Payer: 59 | Admitting: Orthopedic Surgery

## 2017-05-06 VITALS — Ht 71.0 in | Wt 161.0 lb

## 2017-05-06 DIAGNOSIS — E118 Type 2 diabetes mellitus with unspecified complications: Secondary | ICD-10-CM

## 2017-05-06 DIAGNOSIS — L97521 Non-pressure chronic ulcer of other part of left foot limited to breakdown of skin: Secondary | ICD-10-CM | POA: Diagnosis not present

## 2017-05-06 DIAGNOSIS — E1165 Type 2 diabetes mellitus with hyperglycemia: Secondary | ICD-10-CM
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
## 2017-05-11 ENCOUNTER — Other Ambulatory Visit: Payer: Self-pay | Admitting: Hematology

## 2017-05-11 DIAGNOSIS — D638 Anemia in other chronic diseases classified elsewhere: Secondary | ICD-10-CM

## 2017-05-17 ENCOUNTER — Other Ambulatory Visit: Payer: 59

## 2017-05-17 ENCOUNTER — Ambulatory Visit: Payer: 59

## 2017-05-20 ENCOUNTER — Other Ambulatory Visit: Payer: Self-pay | Admitting: Cardiology

## 2017-06-03 ENCOUNTER — Ambulatory Visit (INDEPENDENT_AMBULATORY_CARE_PROVIDER_SITE_OTHER): Payer: 59 | Admitting: Orthopedic Surgery

## 2017-06-03 ENCOUNTER — Encounter (INDEPENDENT_AMBULATORY_CARE_PROVIDER_SITE_OTHER): Payer: Self-pay | Admitting: Orthopedic Surgery

## 2017-06-03 DIAGNOSIS — L97521 Non-pressure chronic ulcer of other part of left foot limited to breakdown of skin: Secondary | ICD-10-CM | POA: Diagnosis not present
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
## 2017-06-14 ENCOUNTER — Telehealth: Payer: Self-pay | Admitting: Hematology

## 2017-06-14 ENCOUNTER — Ambulatory Visit (HOSPITAL_BASED_OUTPATIENT_CLINIC_OR_DEPARTMENT_OTHER): Payer: 59

## 2017-06-14 ENCOUNTER — Other Ambulatory Visit (HOSPITAL_BASED_OUTPATIENT_CLINIC_OR_DEPARTMENT_OTHER): Payer: 59

## 2017-06-14 VITALS — BP 125/70 | HR 88 | Temp 98.2°F | Resp 18

## 2017-06-14 DIAGNOSIS — N189 Chronic kidney disease, unspecified: Secondary | ICD-10-CM | POA: Diagnosis not present

## 2017-06-14 DIAGNOSIS — I739 Peripheral vascular disease, unspecified: Secondary | ICD-10-CM | POA: Diagnosis not present

## 2017-06-14 DIAGNOSIS — E1129 Type 2 diabetes mellitus with other diabetic kidney complication: Secondary | ICD-10-CM | POA: Diagnosis not present

## 2017-06-14 DIAGNOSIS — D631 Anemia in chronic kidney disease: Secondary | ICD-10-CM

## 2017-06-14 DIAGNOSIS — I129 Hypertensive chronic kidney disease with stage 1 through stage 4 chronic kidney disease, or unspecified chronic kidney disease: Secondary | ICD-10-CM | POA: Diagnosis not present

## 2017-06-14 DIAGNOSIS — N183 Chronic kidney disease, stage 3 (moderate): Secondary | ICD-10-CM | POA: Diagnosis not present

## 2017-06-14 DIAGNOSIS — D638 Anemia in other chronic diseases classified elsewhere: Secondary | ICD-10-CM

## 2017-06-14 LAB — CBC WITH DIFFERENTIAL/PLATELET
BASO%: 0.4 % (ref 0.0–2.0)
Basophils Absolute: 0 10*3/uL (ref 0.0–0.1)
EOS ABS: 0.3 10*3/uL (ref 0.0–0.5)
EOS%: 4 % (ref 0.0–7.0)
HEMATOCRIT: 31.6 % — AB (ref 38.4–49.9)
HGB: 10 g/dL — ABNORMAL LOW (ref 13.0–17.1)
LYMPH%: 16.6 % (ref 14.0–49.0)
MCH: 24.2 pg — ABNORMAL LOW (ref 27.2–33.4)
MCHC: 31.7 g/dL — AB (ref 32.0–36.0)
MCV: 76.2 fL — AB (ref 79.3–98.0)
MONO#: 0.4 10*3/uL (ref 0.1–0.9)
MONO%: 6.2 % (ref 0.0–14.0)
NEUT#: 4.5 10*3/uL (ref 1.5–6.5)
NEUT%: 72.8 % (ref 39.0–75.0)
PLATELETS: 290 10*3/uL (ref 140–400)
RBC: 4.15 10*6/uL — AB (ref 4.20–5.82)
RDW: 17.3 % — ABNORMAL HIGH (ref 11.0–14.6)
WBC: 6.2 10*3/uL (ref 4.0–10.3)
lymph#: 1 10*3/uL (ref 0.9–3.3)

## 2017-06-14 MED ORDER — DARBEPOETIN ALFA 150 MCG/0.3ML IJ SOSY
150.0000 ug | PREFILLED_SYRINGE | Freq: Once | INTRAMUSCULAR | Status: AC
  Administered 2017-06-14: 150 ug via SUBCUTANEOUS
  Filled 2017-06-14: qty 0.3

## 2017-06-14 NOTE — Telephone Encounter (Signed)
Patient/relative stopped by after injection re order of appointments. Per relative appointments should be monthly. Confirmed per 04/19/2017 los - patient to have lab/inj q4w to start 8/6. Appointments adjusted to be q4w from today's visit. Patient/relative given updated schedule for October 2018 thru January 2019.

## 2017-07-01 ENCOUNTER — Ambulatory Visit (INDEPENDENT_AMBULATORY_CARE_PROVIDER_SITE_OTHER): Payer: Medicare Other | Admitting: Orthopedic Surgery

## 2017-07-07 ENCOUNTER — Ambulatory Visit (INDEPENDENT_AMBULATORY_CARE_PROVIDER_SITE_OTHER): Payer: Medicare Other | Admitting: Orthopedic Surgery

## 2017-07-12 ENCOUNTER — Inpatient Hospital Stay (HOSPITAL_BASED_OUTPATIENT_CLINIC_OR_DEPARTMENT_OTHER): Payer: Medicare Other

## 2017-07-12 ENCOUNTER — Inpatient Hospital Stay: Payer: Medicare Other | Attending: Hematology

## 2017-07-12 VITALS — BP 162/86 | HR 81 | Temp 97.2°F | Resp 16

## 2017-07-12 DIAGNOSIS — D631 Anemia in chronic kidney disease: Secondary | ICD-10-CM | POA: Diagnosis not present

## 2017-07-12 DIAGNOSIS — N189 Chronic kidney disease, unspecified: Secondary | ICD-10-CM

## 2017-07-12 DIAGNOSIS — D638 Anemia in other chronic diseases classified elsewhere: Secondary | ICD-10-CM

## 2017-07-12 LAB — CBC WITH DIFFERENTIAL/PLATELET
BASO%: 0.1 % (ref 0.0–2.0)
Basophils Absolute: 0 10*3/uL (ref 0.0–0.1)
EOS%: 3.4 % (ref 0.0–7.0)
Eosinophils Absolute: 0.3 10*3/uL (ref 0.0–0.5)
HCT: 35.6 % — ABNORMAL LOW (ref 38.4–49.9)
HGB: 10.9 g/dL — ABNORMAL LOW (ref 13.0–17.1)
LYMPH%: 21.2 % (ref 14.0–49.0)
MCH: 24.1 pg — ABNORMAL LOW (ref 27.2–33.4)
MCHC: 30.6 g/dL — ABNORMAL LOW (ref 32.0–36.0)
MCV: 78.8 fL — ABNORMAL LOW (ref 79.3–98.0)
MONO#: 0.7 10*3/uL (ref 0.1–0.9)
MONO%: 8.4 % (ref 0.0–14.0)
NEUT%: 66.9 % (ref 39.0–75.0)
NEUTROS ABS: 5.4 10*3/uL (ref 1.5–6.5)
Platelets: 315 10*3/uL (ref 140–400)
RBC: 4.52 10*6/uL (ref 4.20–5.82)
RDW: 15.6 % — ABNORMAL HIGH (ref 11.0–14.6)
WBC: 8 10*3/uL (ref 4.0–10.3)
lymph#: 1.7 10*3/uL (ref 0.9–3.3)

## 2017-07-12 LAB — IRON AND TIBC
%SAT: 30 % (ref 20–55)
IRON: 62 ug/dL (ref 42–163)
TIBC: 208 ug/dL (ref 202–409)
UIBC: 145 ug/dL (ref 117–376)

## 2017-07-12 LAB — FERRITIN: Ferritin: 696 ng/ml — ABNORMAL HIGH (ref 22–316)

## 2017-07-12 MED ORDER — DARBEPOETIN ALFA 150 MCG/0.3ML IJ SOSY
150.0000 ug | PREFILLED_SYRINGE | Freq: Once | INTRAMUSCULAR | Status: AC
  Administered 2017-07-12: 150 ug via SUBCUTANEOUS
  Filled 2017-07-12: qty 0.3

## 2017-07-12 NOTE — Patient Instructions (Signed)

## 2017-07-12 NOTE — Progress Notes (Signed)
Pt here for Aranesp injection.  BP  Left Arm   169/87  ,   Right Arm  162/86 .   Pt stated he had not taken BP med this am.  Dr. Gweneth Dimitri notified.  OK to give Aranesp, and to reinforce that pt needs to take BP med when he returns home.  Explanations given to pt.  Pt voiced understanding.

## 2017-07-13 ENCOUNTER — Ambulatory Visit (INDEPENDENT_AMBULATORY_CARE_PROVIDER_SITE_OTHER): Payer: 59 | Admitting: Orthopedic Surgery

## 2017-07-13 ENCOUNTER — Encounter (INDEPENDENT_AMBULATORY_CARE_PROVIDER_SITE_OTHER): Payer: Self-pay | Admitting: Orthopedic Surgery

## 2017-07-13 ENCOUNTER — Ambulatory Visit (INDEPENDENT_AMBULATORY_CARE_PROVIDER_SITE_OTHER): Payer: 59

## 2017-07-13 DIAGNOSIS — E118 Type 2 diabetes mellitus with unspecified complications: Secondary | ICD-10-CM

## 2017-07-13 DIAGNOSIS — L97521 Non-pressure chronic ulcer of other part of left foot limited to breakdown of skin: Secondary | ICD-10-CM

## 2017-07-13 DIAGNOSIS — M86272 Subacute osteomyelitis, left ankle and foot: Secondary | ICD-10-CM | POA: Diagnosis not present

## 2017-07-13 DIAGNOSIS — E1165 Type 2 diabetes mellitus with hyperglycemia: Secondary | ICD-10-CM

## 2017-07-13 DIAGNOSIS — IMO0002 Reserved for concepts with insufficient information to code with codable children: Secondary | ICD-10-CM
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
## 2017-07-14 ENCOUNTER — Other Ambulatory Visit (INDEPENDENT_AMBULATORY_CARE_PROVIDER_SITE_OTHER): Payer: Self-pay | Admitting: Family

## 2017-07-15 ENCOUNTER — Encounter (HOSPITAL_COMMUNITY): Payer: Self-pay | Admitting: *Deleted
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
## 2017-07-15 NOTE — Progress Notes (Signed)
Pt SDW-Pre-op call completed by both pt and pt spouse (DPR). Pt denies SOB and chest pain. Pt under the care of Dr. Jens Som, Cardiology. Pt denies having a cardiac cath. Pt made aware to stop taking vitamins, fish oil and herbal medications. Do not take any NSAIDs ie: Ibuprofen, Advil, Naproxen (Aleve), Motrin, BC and Goody Powder. Pt made aware to take 9 units of Lantus tonight. Pt made aware to check BG every 2 hours prior to arrival to hospital on DOS. Pt made aware to treat a BG < 70 with 4 ounces of apple or cranberry juice, wait 15 minutes after drinking juice to recheck BG, if BG remains < 70, call Short Stay unit to speak with a nurse. Spouse verbalized understanding of all pre-op instructions.

## 2017-07-15 NOTE — Progress Notes (Signed)
Anesthesia asked to review pt history. 

## 2017-07-16 ENCOUNTER — Inpatient Hospital Stay (HOSPITAL_COMMUNITY): Payer: 59 | Admitting: Vascular Surgery

## 2017-07-16 ENCOUNTER — Inpatient Hospital Stay (HOSPITAL_COMMUNITY)
Admission: RE | Admit: 2017-07-16 | Discharge: 2017-07-20 | DRG: 475 | Disposition: A | Discharge status: Rehabilitation Facility | Payer: 59 | Source: Ambulatory Visit | Attending: Orthopedic Surgery | Admitting: Orthopedic Surgery

## 2017-07-16 ENCOUNTER — Encounter (HOSPITAL_COMMUNITY)
Admission: RE | Disposition: A | Discharge status: Rehabilitation Facility | Payer: Self-pay | Source: Ambulatory Visit | Attending: Orthopedic Surgery

## 2017-07-16 ENCOUNTER — Encounter (HOSPITAL_COMMUNITY): Payer: Self-pay | Admitting: Certified Registered"

## 2017-07-16 DIAGNOSIS — Z91018 Allergy to other foods: Secondary | ICD-10-CM | POA: Diagnosis not present

## 2017-07-16 DIAGNOSIS — Z79899 Other long term (current) drug therapy: Secondary | ICD-10-CM

## 2017-07-16 DIAGNOSIS — Z7982 Long term (current) use of aspirin: Secondary | ICD-10-CM

## 2017-07-16 DIAGNOSIS — Z794 Long term (current) use of insulin: Secondary | ICD-10-CM

## 2017-07-16 DIAGNOSIS — Y835 Amputation of limb(s) as the cause of abnormal reaction of the patient, or of later complication, without mention of misadventure at the time of the procedure: Secondary | ICD-10-CM | POA: Diagnosis present

## 2017-07-16 DIAGNOSIS — E8809 Other disorders of plasma-protein metabolism, not elsewhere classified: Secondary | ICD-10-CM | POA: Diagnosis present

## 2017-07-16 DIAGNOSIS — Z87891 Personal history of nicotine dependence: Secondary | ICD-10-CM | POA: Diagnosis not present

## 2017-07-16 DIAGNOSIS — E1122 Type 2 diabetes mellitus with diabetic chronic kidney disease: Secondary | ICD-10-CM | POA: Diagnosis not present

## 2017-07-16 DIAGNOSIS — I5042 Chronic combined systolic (congestive) and diastolic (congestive) heart failure: Secondary | ICD-10-CM

## 2017-07-16 DIAGNOSIS — I129 Hypertensive chronic kidney disease with stage 1 through stage 4 chronic kidney disease, or unspecified chronic kidney disease: Secondary | ICD-10-CM | POA: Diagnosis not present

## 2017-07-16 DIAGNOSIS — E1152 Type 2 diabetes mellitus with diabetic peripheral angiopathy with gangrene: Secondary | ICD-10-CM | POA: Diagnosis present

## 2017-07-16 DIAGNOSIS — E1142 Type 2 diabetes mellitus with diabetic polyneuropathy: Secondary | ICD-10-CM | POA: Diagnosis present

## 2017-07-16 DIAGNOSIS — M79605 Pain in left leg: Secondary | ICD-10-CM | POA: Diagnosis not present

## 2017-07-16 DIAGNOSIS — D638 Anemia in other chronic diseases classified elsewhere: Secondary | ICD-10-CM | POA: Diagnosis not present

## 2017-07-16 DIAGNOSIS — K5900 Constipation, unspecified: Secondary | ICD-10-CM | POA: Diagnosis not present

## 2017-07-16 DIAGNOSIS — I428 Other cardiomyopathies: Secondary | ICD-10-CM | POA: Diagnosis present

## 2017-07-16 DIAGNOSIS — E11621 Type 2 diabetes mellitus with foot ulcer: Secondary | ICD-10-CM | POA: Diagnosis not present

## 2017-07-16 DIAGNOSIS — N179 Acute kidney failure, unspecified: Secondary | ICD-10-CM | POA: Diagnosis not present

## 2017-07-16 DIAGNOSIS — M86272 Subacute osteomyelitis, left ankle and foot: Secondary | ICD-10-CM | POA: Diagnosis present

## 2017-07-16 DIAGNOSIS — Z89512 Acquired absence of left leg below knee: Secondary | ICD-10-CM | POA: Diagnosis not present

## 2017-07-16 DIAGNOSIS — Z4781 Encounter for orthopedic aftercare following surgical amputation: Secondary | ICD-10-CM | POA: Diagnosis not present

## 2017-07-16 DIAGNOSIS — E119 Type 2 diabetes mellitus without complications: Secondary | ICD-10-CM

## 2017-07-16 DIAGNOSIS — I10 Essential (primary) hypertension: Secondary | ICD-10-CM

## 2017-07-16 DIAGNOSIS — S88112S Complete traumatic amputation at level between knee and ankle, left lower leg, sequela: Secondary | ICD-10-CM | POA: Diagnosis not present

## 2017-07-16 DIAGNOSIS — D649 Anemia, unspecified: Secondary | ICD-10-CM | POA: Diagnosis present

## 2017-07-16 DIAGNOSIS — G8918 Other acute postprocedural pain: Secondary | ICD-10-CM | POA: Diagnosis not present

## 2017-07-16 DIAGNOSIS — D62 Acute posthemorrhagic anemia: Secondary | ICD-10-CM

## 2017-07-16 DIAGNOSIS — E1169 Type 2 diabetes mellitus with other specified complication: Secondary | ICD-10-CM | POA: Diagnosis not present

## 2017-07-16 DIAGNOSIS — I70262 Atherosclerosis of native arteries of extremities with gangrene, left leg: Secondary | ICD-10-CM | POA: Diagnosis not present

## 2017-07-16 DIAGNOSIS — Z841 Family history of disorders of kidney and ureter: Secondary | ICD-10-CM | POA: Diagnosis not present

## 2017-07-16 DIAGNOSIS — I130 Hypertensive heart and chronic kidney disease with heart failure and stage 1 through stage 4 chronic kidney disease, or unspecified chronic kidney disease: Secondary | ICD-10-CM | POA: Diagnosis present

## 2017-07-16 DIAGNOSIS — N183 Chronic kidney disease, stage 3 unspecified: Secondary | ICD-10-CM

## 2017-07-16 DIAGNOSIS — E785 Hyperlipidemia, unspecified: Secondary | ICD-10-CM | POA: Diagnosis present

## 2017-07-16 DIAGNOSIS — E1151 Type 2 diabetes mellitus with diabetic peripheral angiopathy without gangrene: Secondary | ICD-10-CM | POA: Diagnosis not present

## 2017-07-16 DIAGNOSIS — Z7409 Other reduced mobility: Secondary | ICD-10-CM | POA: Diagnosis not present

## 2017-07-16 DIAGNOSIS — L97529 Non-pressure chronic ulcer of other part of left foot with unspecified severity: Secondary | ICD-10-CM | POA: Diagnosis not present

## 2017-07-16 DIAGNOSIS — K5901 Slow transit constipation: Secondary | ICD-10-CM | POA: Diagnosis not present

## 2017-07-16 DIAGNOSIS — T8781 Dehiscence of amputation stump: Secondary | ICD-10-CM | POA: Diagnosis present

## 2017-07-16 DIAGNOSIS — Z91013 Allergy to seafood: Secondary | ICD-10-CM | POA: Diagnosis not present

## 2017-07-16 DIAGNOSIS — I82402 Acute embolism and thrombosis of unspecified deep veins of left lower extremity: Secondary | ICD-10-CM | POA: Diagnosis not present

## 2017-07-16 DIAGNOSIS — E46 Unspecified protein-calorie malnutrition: Secondary | ICD-10-CM | POA: Diagnosis not present

## 2017-07-16 DIAGNOSIS — E11649 Type 2 diabetes mellitus with hypoglycemia without coma: Secondary | ICD-10-CM | POA: Diagnosis not present

## 2017-07-16 DIAGNOSIS — D500 Iron deficiency anemia secondary to blood loss (chronic): Secondary | ICD-10-CM | POA: Diagnosis not present

## 2017-07-16 DIAGNOSIS — M79672 Pain in left foot: Secondary | ICD-10-CM | POA: Diagnosis present

## 2017-07-16 HISTORY — DX: Chronic kidney disease, unspecified: N18.9

## 2017-07-16 HISTORY — DX: Anemia, unspecified: D64.9

## 2017-07-16 HISTORY — PX: AMPUTATION: SHX166

## 2017-07-16 HISTORY — DX: Cardiomyopathy, unspecified: I42.9

## 2017-07-16 HISTORY — DX: Osteomyelitis, unspecified: M86.9

## 2017-07-16 LAB — HEMOGLOBIN A1C
Hgb A1c MFr Bld: 7.5 % — ABNORMAL HIGH (ref 4.8–5.6)
Mean Plasma Glucose: 168.55 mg/dL

## 2017-07-16 LAB — BASIC METABOLIC PANEL
Anion gap: 6 (ref 5–15)
BUN: 20 mg/dL (ref 6–20)
CALCIUM: 9.1 mg/dL (ref 8.9–10.3)
CO2: 26 mmol/L (ref 22–32)
Chloride: 106 mmol/L (ref 101–111)
Creatinine, Ser: 1.89 mg/dL — ABNORMAL HIGH (ref 0.61–1.24)
GFR calc Af Amer: 43 mL/min — ABNORMAL LOW (ref >60)
GFR calc non Af Amer: 38 mL/min — ABNORMAL LOW (ref >60)
GLUCOSE: 159 mg/dL — AB (ref 65–99)
Potassium: 3.6 mmol/L (ref 3.5–5.1)
SODIUM: 138 mmol/L (ref 135–145)

## 2017-07-16 LAB — GLUCOSE, CAPILLARY
GLUCOSE-CAPILLARY: 157 mg/dL — AB (ref 65–99)
GLUCOSE-CAPILLARY: 158 mg/dL — AB (ref 65–99)
GLUCOSE-CAPILLARY: 195 mg/dL — AB (ref 65–99)
Glucose-Capillary: 160 mg/dL — ABNORMAL HIGH (ref 65–99)
Glucose-Capillary: 105 mg/dL — ABNORMAL HIGH (ref 65–99)

## 2017-07-16 SURGERY — AMPUTATION BELOW KNEE
Anesthesia: General | Laterality: Left

## 2017-07-16 MED ORDER — INSULIN ASPART 100 UNIT/ML ~~LOC~~ SOLN
0.0000 [IU] | Freq: Three times a day (TID) | SUBCUTANEOUS | Status: DC
  Administered 2017-07-16 – 2017-07-18 (×5): 2 [IU] via SUBCUTANEOUS
  Administered 2017-07-18: 3 [IU] via SUBCUTANEOUS
  Administered 2017-07-19 (×2): 2 [IU] via SUBCUTANEOUS
  Administered 2017-07-20: 3 [IU] via SUBCUTANEOUS

## 2017-07-16 MED ORDER — METHOCARBAMOL 1000 MG/10ML IJ SOLN: 500.0000 mg | Freq: Four times a day (QID) | INTRAMUSCULAR | Status: DC | PRN

## 2017-07-16 MED ORDER — TAB-A-VITE/IRON PO TABS
1.0000 | ORAL_TABLET | Freq: Every day | ORAL | Status: DC
  Administered 2017-07-17 – 2017-07-20 (×4): 1 via ORAL
  Filled 2017-07-16 (×4): qty 1

## 2017-07-16 MED ORDER — DARBEPOETIN ALFA 300 MCG/ML IJ SOLN: 300.0000 ug | INTRAMUSCULAR | Status: DC

## 2017-07-16 MED ORDER — MAGNESIUM CITRATE PO SOLN: 1.0000 | Freq: Once | ORAL | Status: DC | PRN

## 2017-07-16 MED ORDER — PHENYLEPHRINE 40 MCG/ML (10ML) SYRINGE FOR IV PUSH (FOR BLOOD PRESSURE SUPPORT)
PREFILLED_SYRINGE | INTRAVENOUS | Status: DC | PRN
  Administered 2017-07-16: 80 ug via INTRAVENOUS

## 2017-07-16 MED ORDER — ACETAMINOPHEN 325 MG PO TABS
650.0000 mg | ORAL_TABLET | ORAL | Status: DC | PRN
Start: 2017-07-16 — End: 2017-07-20

## 2017-07-16 MED ORDER — DARBEPOETIN ALFA 300 MCG/0.6ML IJ SOSY: 300.0000 ug | PREFILLED_SYRINGE | INTRAMUSCULAR | Status: DC

## 2017-07-16 MED ORDER — OXYCODONE HCL 5 MG PO TABS
5.0000 mg | ORAL_TABLET | ORAL | Status: DC | PRN
  Administered 2017-07-18 (×2): 10 mg via ORAL
  Administered 2017-07-18: 5 mg via ORAL
  Administered 2017-07-19 (×4): 10 mg via ORAL
  Filled 2017-07-16 (×9): qty 2

## 2017-07-16 MED ORDER — ONDANSETRON HCL 4 MG/2ML IJ SOLN
INTRAMUSCULAR | Status: AC
  Filled 2017-07-16: qty 2

## 2017-07-16 MED ORDER — MEPERIDINE HCL 25 MG/ML IJ SOLN: 6.2500 mg | INTRAMUSCULAR | Status: DC | PRN

## 2017-07-16 MED ORDER — LACTATED RINGERS IV SOLN
INTRAVENOUS | Status: DC | PRN
  Administered 2017-07-16: 07:00:00 via INTRAVENOUS

## 2017-07-16 MED ORDER — GLIPIZIDE ER 10 MG PO TB24
10.0000 mg | ORAL_TABLET | Freq: Every day | ORAL | Status: DC
  Administered 2017-07-17 – 2017-07-20 (×4): 10 mg via ORAL
  Filled 2017-07-16 (×4): qty 1

## 2017-07-16 MED ORDER — ATORVASTATIN CALCIUM 80 MG PO TABS
80.0000 mg | ORAL_TABLET | Freq: Every day | ORAL | Status: DC
  Administered 2017-07-16 – 2017-07-19 (×4): 80 mg via ORAL
  Filled 2017-07-16 (×4): qty 1

## 2017-07-16 MED ORDER — LIDOCAINE 2% (20 MG/ML) 5 ML SYRINGE
INTRAMUSCULAR | Status: DC | PRN
  Administered 2017-07-16: 100 mg via INTRAVENOUS

## 2017-07-16 MED ORDER — HYDRALAZINE HCL 20 MG/ML IJ SOLN
10.0000 mg | INTRAMUSCULAR | Status: DC | PRN
  Administered 2017-07-16: 10 mg via INTRAVENOUS
  Filled 2017-07-16: qty 1

## 2017-07-16 MED ORDER — SODIUM CHLORIDE 0.9 % IV SOLN
INTRAVENOUS | Status: DC
  Administered 2017-07-16: 14:00:00 via INTRAVENOUS

## 2017-07-16 MED ORDER — FENTANYL CITRATE (PF) 100 MCG/2ML IJ SOLN
25.0000 ug | INTRAMUSCULAR | Status: DC | PRN
Start: 2017-07-16 — End: 2017-07-16
  Administered 2017-07-16 (×3): 50 ug via INTRAVENOUS

## 2017-07-16 MED ORDER — BISACODYL 10 MG RE SUPP: 10.0000 mg | Freq: Every day | RECTAL | Status: DC | PRN

## 2017-07-16 MED ORDER — MIDAZOLAM HCL 5 MG/5ML IJ SOLN
INTRAMUSCULAR | Status: DC | PRN
  Administered 2017-07-16: 2 mg via INTRAVENOUS

## 2017-07-16 MED ORDER — METOPROLOL TARTRATE 50 MG PO TABS: 50.0000 mg | ORAL_TABLET | Freq: Two times a day (BID) | ORAL | Status: DC

## 2017-07-16 MED ORDER — METOCLOPRAMIDE HCL 5 MG PO TABS: 5.0000 mg | ORAL_TABLET | Freq: Three times a day (TID) | ORAL | Status: DC | PRN

## 2017-07-16 MED ORDER — ONDANSETRON HCL 4 MG/2ML IJ SOLN
4.0000 mg | Freq: Four times a day (QID) | INTRAMUSCULAR | Status: DC | PRN
  Administered 2017-07-16: 4 mg via INTRAVENOUS
  Filled 2017-07-16 (×2): qty 2

## 2017-07-16 MED ORDER — METOPROLOL TARTRATE 5 MG/5ML IV SOLN
INTRAVENOUS | Status: AC
  Administered 2017-07-16: 09:00:00
  Filled 2017-07-16: qty 5

## 2017-07-16 MED ORDER — PHENYLEPHRINE 40 MCG/ML (10ML) SYRINGE FOR IV PUSH (FOR BLOOD PRESSURE SUPPORT)
PREFILLED_SYRINGE | INTRAVENOUS | Status: AC
Start: 2017-07-16 — End: 2017-07-16
  Filled 2017-07-16: qty 10

## 2017-07-16 MED ORDER — CHLORHEXIDINE GLUCONATE 4 % EX LIQD: 60.0000 mL | Freq: Once | CUTANEOUS | Status: DC

## 2017-07-16 MED ORDER — ONDANSETRON HCL 4 MG/2ML IJ SOLN
INTRAMUSCULAR | Status: DC | PRN
  Administered 2017-07-16: 4 mg via INTRAVENOUS

## 2017-07-16 MED ORDER — ACETAMINOPHEN 650 MG RE SUPP
650.0000 mg | RECTAL | Status: DC | PRN
Start: 2017-07-16 — End: 2017-07-20

## 2017-07-16 MED ORDER — ASPIRIN EC 81 MG PO TBEC
81.0000 mg | DELAYED_RELEASE_TABLET | Freq: Every day | ORAL | Status: DC
  Administered 2017-07-17 – 2017-07-20 (×4): 81 mg via ORAL
  Filled 2017-07-16 (×4): qty 1

## 2017-07-16 MED ORDER — CEFAZOLIN SODIUM-DEXTROSE 1-4 GM/50ML-% IV SOLN
1.0000 g | Freq: Four times a day (QID) | INTRAVENOUS | Status: AC
  Administered 2017-07-16 – 2017-07-17 (×3): 1 g via INTRAVENOUS
  Filled 2017-07-16 (×3): qty 50

## 2017-07-16 MED ORDER — METOCLOPRAMIDE HCL 5 MG/ML IJ SOLN: 10.0000 mg | Freq: Once | INTRAMUSCULAR | Status: DC | PRN

## 2017-07-16 MED ORDER — DOCUSATE SODIUM 100 MG PO CAPS
100.0000 mg | ORAL_CAPSULE | Freq: Two times a day (BID) | ORAL | Status: DC
  Administered 2017-07-16 – 2017-07-20 (×8): 100 mg via ORAL
  Filled 2017-07-16 (×8): qty 1

## 2017-07-16 MED ORDER — METOCLOPRAMIDE HCL 5 MG/ML IJ SOLN: 5.0000 mg | Freq: Three times a day (TID) | INTRAMUSCULAR | Status: DC | PRN

## 2017-07-16 MED ORDER — INSULIN ASPART 100 UNIT/ML ~~LOC~~ SOLN
3.0000 [IU] | Freq: Three times a day (TID) | SUBCUTANEOUS | Status: DC
  Administered 2017-07-16 – 2017-07-20 (×7): 3 [IU] via SUBCUTANEOUS

## 2017-07-16 MED ORDER — POLYETHYLENE GLYCOL 3350 17 G PO PACK
17.0000 g | PACK | Freq: Every day | ORAL | Status: DC | PRN
  Administered 2017-07-19: 17 g via ORAL
  Filled 2017-07-16: qty 1

## 2017-07-16 MED ORDER — MIDAZOLAM HCL 2 MG/2ML IJ SOLN
INTRAMUSCULAR | Status: AC
  Filled 2017-07-16: qty 2

## 2017-07-16 MED ORDER — CARVEDILOL 12.5 MG PO TABS: 12.5000 mg | ORAL_TABLET | Freq: Two times a day (BID) | ORAL | Status: DC

## 2017-07-16 MED ORDER — 0.9 % SODIUM CHLORIDE (POUR BTL) OPTIME
TOPICAL | Status: DC | PRN
  Administered 2017-07-16: 1000 mL

## 2017-07-16 MED ORDER — INTEGRA PLUS PO CAPS: 1.0000 | ORAL_CAPSULE | Freq: Every day | ORAL | Status: DC

## 2017-07-16 MED ORDER — METHOCARBAMOL 500 MG PO TABS
500.0000 mg | ORAL_TABLET | Freq: Four times a day (QID) | ORAL | Status: DC | PRN
  Administered 2017-07-16 – 2017-07-19 (×6): 500 mg via ORAL
  Filled 2017-07-16 (×5): qty 1

## 2017-07-16 MED ORDER — ONDANSETRON HCL 4 MG PO TABS: 4.0000 mg | ORAL_TABLET | Freq: Four times a day (QID) | ORAL | Status: DC | PRN

## 2017-07-16 MED ORDER — METOPROLOL TARTRATE 5 MG/5ML IV SOLN
5.0000 mg | Freq: Once | INTRAVENOUS | Status: AC
  Administered 2017-07-16: 5 mg via INTRAVENOUS

## 2017-07-16 MED ORDER — HYDROMORPHONE HCL 1 MG/ML IJ SOLN
1.0000 mg | INTRAMUSCULAR | Status: DC | PRN
  Administered 2017-07-16 – 2017-07-18 (×4): 1 mg via INTRAVENOUS
  Filled 2017-07-16 (×4): qty 1

## 2017-07-16 MED ORDER — CARVEDILOL 25 MG PO TABS
25.0000 mg | ORAL_TABLET | Freq: Two times a day (BID) | ORAL | Status: DC
  Administered 2017-07-16 – 2017-07-20 (×8): 25 mg via ORAL
  Filled 2017-07-16 (×8): qty 1

## 2017-07-16 MED ORDER — PROPOFOL 10 MG/ML IV BOLUS
INTRAVENOUS | Status: DC | PRN
  Administered 2017-07-16: 150 mg via INTRAVENOUS

## 2017-07-16 MED ORDER — CEFAZOLIN SODIUM-DEXTROSE 2-4 GM/100ML-% IV SOLN
2.0000 g | INTRAVENOUS | Status: AC
  Administered 2017-07-16: 2 g via INTRAVENOUS
  Filled 2017-07-16: qty 100

## 2017-07-16 MED ORDER — LIDOCAINE 2% (20 MG/ML) 5 ML SYRINGE
INTRAMUSCULAR | Status: AC
  Filled 2017-07-16: qty 5

## 2017-07-16 MED ORDER — FENTANYL CITRATE (PF) 250 MCG/5ML IJ SOLN
INTRAMUSCULAR | Status: AC
  Filled 2017-07-16: qty 5

## 2017-07-16 MED ORDER — FENTANYL CITRATE (PF) 100 MCG/2ML IJ SOLN
INTRAMUSCULAR | Status: AC
  Filled 2017-07-16: qty 2

## 2017-07-16 MED ORDER — METHOCARBAMOL 500 MG PO TABS
ORAL_TABLET | ORAL | Status: AC
  Administered 2017-07-16: 09:00:00
  Filled 2017-07-16: qty 1

## 2017-07-16 MED ORDER — LACTATED RINGERS IV SOLN
INTRAVENOUS | Status: DC
  Administered 2017-07-16: 09:00:00 via INTRAVENOUS

## 2017-07-16 MED ORDER — INSULIN GLARGINE 100 UNIT/ML ~~LOC~~ SOLN
15.0000 [IU] | Freq: Every day | SUBCUTANEOUS | Status: DC
  Administered 2017-07-16 – 2017-07-19 (×4): 15 [IU] via SUBCUTANEOUS
  Filled 2017-07-16 (×5): qty 0.15

## 2017-07-16 MED ORDER — PROPOFOL 10 MG/ML IV BOLUS
INTRAVENOUS | Status: AC
  Filled 2017-07-16: qty 20

## 2017-07-16 SURGICAL SUPPLY — 31 items
BLADE SAW RECIP 87.9 MT (BLADE) ×3 IMPLANT
BLADE SURG 21 STRL SS (BLADE) ×3 IMPLANT
BNDG COHESIVE 6X5 TAN STRL LF (GAUZE/BANDAGES/DRESSINGS) ×2 IMPLANT
BNDG GAUZE ELAST 4 BULKY (GAUZE/BANDAGES/DRESSINGS) ×2 IMPLANT
CANISTER SUCTION 1500CC (MISCELLANEOUS) ×2 IMPLANT
COVER SURGICAL LIGHT HANDLE (MISCELLANEOUS) ×3 IMPLANT
CUFF TOURNIQUET SINGLE 34IN LL (TOURNIQUET CUFF) IMPLANT
CUFF TOURNIQUET SINGLE 44IN (TOURNIQUET CUFF) IMPLANT
DRAPE INCISE IOBAN 66X45 STRL (DRAPES) ×2 IMPLANT
DRAPE U-SHAPE 47X51 STRL (DRAPES) ×3 IMPLANT
DRSG VAC ATS MED SENSATRAC (GAUZE/BANDAGES/DRESSINGS) ×3 IMPLANT
ELECT REM PT RETURN 9FT ADLT (ELECTROSURGICAL) ×3
ELECTRODE REM PT RTRN 9FT ADLT (ELECTROSURGICAL) ×1 IMPLANT
GLOVE BIOGEL PI IND STRL 9 (GLOVE) ×1 IMPLANT
GLOVE BIOGEL PI INDICATOR 9 (GLOVE) ×4
GLOVE SURG ORTHO 9.0 STRL STRW (GLOVE) ×5 IMPLANT
GOWN STRL REUS W/ TWL XL LVL3 (GOWN DISPOSABLE) ×2 IMPLANT
GOWN STRL REUS W/TWL XL LVL3 (GOWN DISPOSABLE) ×6
KIT BASIN OR (CUSTOM PROCEDURE TRAY) ×3 IMPLANT
KIT ROOM TURNOVER OR (KITS) ×3 IMPLANT
MANIFOLD NEPTUNE II (INSTRUMENTS) ×3 IMPLANT
NS IRRIG 1000ML POUR BTL (IV SOLUTION) ×3 IMPLANT
PACK ORTHO EXTREMITY (CUSTOM PROCEDURE TRAY) ×3 IMPLANT
PAD ARMBOARD 7.5X6 YLW CONV (MISCELLANEOUS) ×3 IMPLANT
SPONGE LAP 18X18 X RAY DECT (DISPOSABLE) IMPLANT
STAPLER VISISTAT 35W (STAPLE) IMPLANT
STOCKINETTE IMPERVIOUS LG (DRAPES) ×3 IMPLANT
SUT SILK 2 0 (SUTURE) ×3
SUT SILK 2-0 18XBRD TIE 12 (SUTURE) ×1 IMPLANT
SUT VIC AB 1 CTX 27 (SUTURE) IMPLANT
TOWEL OR 17X26 10 PK STRL BLUE (TOWEL DISPOSABLE) ×3 IMPLANT

## 2017-07-16 NOTE — Anesthesia Procedure Notes (Signed)
Procedure Name: LMA Insertion Date/Time: 07/16/2017 7:55 AM Performed by: Charm Barges, Roney Youtz R Pre-anesthesia Checklist: Patient identified, Emergency Drugs available, Suction available and Patient being monitored Patient Re-evaluated:Patient Re-evaluated prior to induction Oxygen Delivery Method: Circle System Utilized Preoxygenation: Pre-oxygenation with 100% oxygen Induction Type: IV induction Ventilation: Mask ventilation without difficulty LMA: LMA inserted LMA Size: 5.0 Number of attempts: 1 Placement Confirmation: positive ETCO2 Tube secured with: Tape Dental Injury: Teeth and Oropharynx as per pre-operative assessment

## 2017-07-16 NOTE — Progress Notes (Signed)
Notified Dr. Tiffany Kocher of pt's blood pressure. Pt. Expressing he is very nervous. No new orders.

## 2017-07-16 NOTE — Transfer of Care (Signed)
Immediate Anesthesia Transfer of Care Note  Patient: Keith Guzman  Procedure(s) Performed: LEFT BELOW KNEE AMPUTATION (Left )  Patient Location: PACU  Anesthesia Type:General  Level of Consciousness: awake, oriented and patient cooperative  Airway & Oxygen Therapy: Patient Spontanous Breathing and Patient connected to nasal cannula oxygen  Post-op Assessment: Report given to RN, Post -op Vital signs reviewed and stable and Patient moving all extremities  Post vital signs: Reviewed and stable  Last Vitals:  Vitals:   07/16/17 0637 07/16/17 0711  BP: (!) 205/103 (!) 210/102  Pulse: 82   Resp: 16   Temp: 36.6 C   SpO2: 100%     Last Pain: There were no vitals filed for this visit.    Patients Stated Pain Goal: 4 (07/16/17 0711)  Complications: No apparent anesthesia complications

## 2017-07-16 NOTE — Progress Notes (Signed)
Orthopedic Tech Progress Note Patient Details:  Keith Guzman 1960-11-04 [id number] Brace completed by bio-tech. Patient ID: Keith Guzman, male   DOB: 16-Jan-1961, 56 y.o.   MRN: [id number]   Keith Guzman 07/16/2017, 4:19 PM

## 2017-07-16 NOTE — Evaluation (Addendum)
Physical Therapy Evaluation Patient Details Name: Keith Guzman MRN: [id number] DOB: July 10, 1961 Today's Date: 07/16/2017   History of Present Illness  56 y.o. male POD#0 s/p L BKA. PMH includes: HTN, Peripheral vascular disease, cardiomyopathy, left mid foot amputation.   Clinical Impression  Patient is s/p above surgery resulting in functional limitations due to the deficits listed below (see PT Problem List). PTA, pt was modified independent with all mobility.  Pt required min assist with bed mobility and contact guard to maintain static balance at EOB. Session limited by fatigue and nausea, anticpiate progression of activity tolerance and further assessment/training of transfers and gait next session 10/20.   Patient will benefit from skilled PT to increase their independence and safety with mobility to allow discharge to the venue listed below.       Follow Up Recommendations Home health PT;DC plan and follow up therapy as arranged by surgeon    Equipment Recommendations   (TBD)    Recommendations for Other Services OT consult     Precautions / Restrictions Precautions Precautions: Fall Restrictions Weight Bearing Restrictions: Yes      Mobility  Bed Mobility Overal bed mobility: Needs Assistance Bed Mobility: Sit to Supine;Supine to Sit     Supine to sit: Min assist Sit to supine: Min assist   General bed mobility comments: Hand held assist to move RLE and support trunk during bed mobility  Transfers Overall transfer level:  (Unable to assess due to nausea, will assess next session.)                  Ambulation/Gait                Stairs            Wheelchair Mobility    Modified Rankin (Stroke Patients Only)       Balance Overall balance assessment: Needs assistance Sitting-balance support: Single extremity supported Sitting balance-Leahy Scale: Poor Sitting balance - Comments: unable to sit at EOB without UE support this session.                                       Pertinent Vitals/Pain Pain Assessment: 0-10 Pain Score: 2  Pain Location: Left Leg Pain Intervention(s): Repositioned;Monitored during session;Limited activity within patient's tolerance    Home Living Family/patient expects to be discharged to:: Private residence Living Arrangements: Spouse/significant other Available Help at Discharge: Family;Available 24 hours/day Type of Home: House Home Access: Stairs to enter Entrance Stairs-Rails: None Entrance Stairs-Number of Steps: 2 Home Layout: One level Home Equipment: Walker - 2 wheels Additional Comments: pt states wife is hoping to [employer] grab bars in bathroom    Prior Function Level of Independence: Independent         Comments: Mod I with hx of midtarsal amputee on L     Hand Dominance        Extremity/Trunk Assessment   Upper Extremity Assessment Upper Extremity Assessment: Overall WFL for tasks assessed    Lower Extremity Assessment Lower Extremity Assessment: Generalized weakness (RLE 4-/5 globally)       Communication   Communication: No difficulties  Cognition Arousal/Alertness: Lethargic Behavior During Therapy: WFL for tasks assessed/performed Overall Cognitive Status: Within Functional Limits for tasks assessed  General Comments General comments (skin integrity, edema, etc.): Pt reports emesis hour prior to session, session limited due to ongoing nausea.     Exercises     Assessment/Plan    PT Assessment Patient needs continued PT services  PT Problem List Decreased strength;Decreased range of motion;Decreased activity tolerance;Decreased balance;Decreased mobility;Pain       PT Treatment Interventions Gait training;Stair training;Functional mobility training;Therapeutic activities;Therapeutic exercise;DME instruction;Patient/family education    PT Goals (Current goals can be found in the  Care Plan section)  Acute Rehab PT Goals Patient Stated Goal: d/c home, agreeable to receive therapy PT Goal Formulation: With patient Time For Goal Achievement: 07/23/17 Potential to Achieve Goals: Good    Frequency 7X/week   Barriers to discharge        Co-evaluation               AM-PAC PT "6 Clicks" Daily Activity  Outcome Measure Difficulty turning over in bed (including adjusting bedclothes, sheets and blankets)?: Unable Difficulty moving from lying on back to sitting on the side of the bed? : Unable Difficulty sitting down on and standing up from a chair with arms (e.g., wheelchair, bedside commode, etc,.)?: Unable Help needed moving to and from a bed to chair (including a wheelchair)?: A Little Help needed walking in hospital room?: A Little Help needed climbing 3-5 steps with a railing? : A Lot 6 Click Score: 11    End of Session   Activity Tolerance: Patient limited by fatigue Patient left: in bed;with call bell/phone within reach Nurse Communication: Mobility status (spoke with nurse about c/o of nausea) PT Visit Diagnosis: Muscle weakness (generalized) (M62.81);Difficulty in walking, not elsewhere classified (R26.2);Pain Pain - Right/Left: Left Pain - part of body: Leg;Knee    Time: 5885-0277 PT Time Calculation (min) (ACUTE ONLY): 25 min   Charges:   PT Evaluation $PT Eval Low Complexity: 1 Low PT Treatments $Therapeutic Activity: 8-22 mins   PT G Codes:        Etta Grandchild, PT, DPT [id number]   Etta Grandchild 07/16/2017, 6:22 PM

## 2017-07-16 NOTE — Op Note (Signed)
   Date of Surgery: 07/16/2017  INDICATIONS: Mr. Basom is a 56 y.o.-year-old male who is status post foot salvage intervention with transmetatarsal amputation who has had dehiscence and gangrenous changes.Marland Kitchen  PREOPERATIVE DIAGNOSIS: osteomyelitis ulceration left transmetatarsal amputation  POSTOPERATIVE DIAGNOSIS: Same.  PROCEDURE: Transtibial amputation Application of Prevena wound VAC  SURGEON: Lajoyce Corners, M.D.  ANESTHESIA:  general  IV FLUIDS AND URINE: See anesthesia.  ESTIMATED BLOOD LOSS: minimal mL.  COMPLICATIONS: None.  DESCRIPTION OF PROCEDURE: The patient was brought to the operating room and underwent a general anesthetic. After adequate levels of anesthesia were obtained patient's lower extremity was prepped using DuraPrep draped into a sterile field. A timeout was called. The foot was draped out of the sterile field with impervious stockinette. A transverse incision was made 11 cm distal to the tibial tubercle. This curved proximally and a large posterior flap was created. The tibia was transected 1 cm proximal to the skin incision. The fibula was transected just proximal to the tibial incision. The tibia was beveled anteriorly. A large posterior flap was created. The sciatic nerve was pulled cut and allowed to retract. The vascular bundles were suture ligated with 2-0 silk. The deep and superficial fascial layers were closed using #1 Vicryl. The skin was closed using staples and 2-0 nylon. The wound was covered with a Prevena wound VAC. There was a good suction fit. A prosthetic shrinker was applied. Patient was extubated taken to the PACU in stable condition.  Aldean Baker, MD [facility] 8:52 AM
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
## 2017-07-16 NOTE — Anesthesia Preprocedure Evaluation (Signed)
Anesthesia Evaluation  Patient identified by MRN, date of birth, ID band Patient awake    Reviewed: Allergy & Precautions, H&P , NPO status , Patient's Chart, lab work & pertinent test results  Airway Mallampati: II  TM Distance: >3 FB Neck ROM: Full    Dental no notable dental hx.    Pulmonary Current Smoker, former smoker,    Pulmonary exam normal breath sounds clear to auscultation       Cardiovascular hypertension, + Peripheral Vascular Disease  Normal cardiovascular exam Rhythm:Regular Rate:Normal     Neuro/Psych negative neurological ROS  negative psych ROS   GI/Hepatic negative GI ROS, Neg liver ROS,   Endo/Other  diabetes, Poorly Controlled  Renal/GU negative Renal ROS  negative genitourinary   Musculoskeletal negative musculoskeletal ROS (+)   Abdominal   Peds negative pediatric ROS (+)  Hematology  (+) Blood dyscrasia, anemia ,   Anesthesia Other Findings   Reproductive/Obstetrics negative OB ROS                             Anesthesia Physical  Anesthesia Plan  ASA: III  Anesthesia Plan: General   Post-op Pain Management:    Induction: Intravenous  PONV Risk Score and Plan: 1 and Ondansetron and Treatment may vary due to age or medical condition  Airway Management Planned: LMA  Additional Equipment:   Intra-op Plan:   Post-operative Plan:   Informed Consent: I have reviewed the patients History and Physical, chart, labs and discussed the procedure including the risks, benefits and alternatives for the proposed anesthesia with the patient or authorized representative who has indicated his/her understanding and acceptance.   Dental advisory given  Plan Discussed with: CRNA and Surgeon  Anesthesia Plan Comments:         Anesthesia Quick Evaluation

## 2017-07-16 NOTE — Progress Notes (Signed)
Orthopedic Tech Progress Note Patient Details:  MARTINJR LUCKER 1961-02-09 [id number] Called [employer] for brace. Patient ID: KWESI SLICER, male   DOB: 06-Sep-1961, 56 y.o.   MRN: [id number]   Jennye Moccasin 07/16/2017, 3:07 PM

## 2017-07-16 NOTE — Progress Notes (Signed)
Consulted with dr Acey Lav regarding pts bp no further rx or orders obtained

## 2017-07-16 NOTE — Anesthesia Postprocedure Evaluation (Signed)
Anesthesia Post Note  Patient: Keith Guzman  Procedure(s) Performed: LEFT BELOW KNEE AMPUTATION (Left )     Patient location during evaluation: PACU Anesthesia Type: General Level of consciousness: awake and alert Pain management: pain level controlled Vital Signs Assessment: post-procedure vital signs reviewed and stable Respiratory status: spontaneous breathing, nonlabored ventilation, respiratory function stable and patient connected to nasal cannula oxygen Cardiovascular status: blood pressure returned to baseline and stable Postop Assessment: no apparent nausea or vomiting Anesthetic complications: no    Last Vitals:  Vitals:   07/16/17 1045 07/16/17 1100  BP: (!) 194/100 (!) 204/109  Pulse: 78 75  Resp: 16 16  Temp: 36.5 C 36.5 C  SpO2: 100% 100%    Last Pain:  Vitals:   07/16/17 1120  TempSrc:   PainSc: 6                  Phillips Grout

## 2017-07-16 NOTE — Anesthesia Preprocedure Evaluation (Addendum)
Anesthesia Evaluation  Patient identified by MRN, date of birth, ID band Patient awake    Reviewed: Allergy & Precautions, NPO status , Patient's Chart, lab work & pertinent test results  Airway Mallampati: II  TM Distance: >3 FB Neck ROM: Full    Dental no notable dental hx. (+) Teeth Intact, Dental Advisory Given   Pulmonary former smoker,    Pulmonary exam normal breath sounds clear to auscultation       Cardiovascular hypertension, Pt. on medications Normal cardiovascular exam Rhythm:Regular Rate:Normal     Neuro/Psych    GI/Hepatic   Endo/Other  diabetes, Type 2, Insulin Dependent  Renal/GU      Musculoskeletal   Abdominal   Peds  Hematology   Anesthesia Other Findings   Reproductive/Obstetrics                            Anesthesia Physical Anesthesia Plan  ASA: III  Anesthesia Plan: General   Post-op Pain Management:    Induction:   PONV Risk Score and Plan: 2 and Ondansetron and Treatment may vary due to age or medical condition  Airway Management Planned: LMA  Additional Equipment: None  Intra-op Plan:   Post-operative Plan: Extubation in OR  Informed Consent: I have reviewed the patients History and Physical, chart, labs and discussed the procedure including the risks, benefits and alternatives for the proposed anesthesia with the patient or authorized representative who has indicated his/her understanding and acceptance.   Dental advisory given  Plan Discussed with: CRNA and Anesthesiologist  Anesthesia Plan Comments:        Anesthesia Quick Evaluation

## 2017-07-16 NOTE — Progress Notes (Signed)
Consulted with dr Acey Lav regarding pts bp med ordered and given

## 2017-07-17 ENCOUNTER — Encounter (HOSPITAL_COMMUNITY): Payer: Self-pay | Admitting: Orthopedic Surgery

## 2017-07-17 LAB — GLUCOSE, CAPILLARY
GLUCOSE-CAPILLARY: 109 mg/dL — AB (ref 65–99)
GLUCOSE-CAPILLARY: 184 mg/dL — AB (ref 65–99)
Glucose-Capillary: 64 mg/dL — ABNORMAL LOW (ref 65–99)
Glucose-Capillary: 158 mg/dL — ABNORMAL HIGH (ref 65–99)
Glucose-Capillary: 186 mg/dL — ABNORMAL HIGH (ref 65–99)

## 2017-07-17 NOTE — Progress Notes (Signed)
   Subjective: 1 Day Post-Op Procedure(s) (LRB): LEFT BELOW KNEE AMPUTATION (Left) Patient reports pain as mild.    Objective: Vital signs in last 24 hours: Temp:  [97.7 F (36.5 C)-99 F (37.2 C)] 99 F (37.2 C) (10/20 0648) Pulse Rate:  [75-102] 102 (10/20 0648) Resp:  [14-16] 16 (10/20 0648) BP: (144-204)/(72-109) 162/96 (10/20 0648) SpO2:  [97 %-100 %] 98 % (10/20 0648)  Intake/Output from previous day: 10/19 0701 - 10/20 0700 In: 760.8 [P.O.:240; I.V.:470.8; IV Piggyback:50] Out: 1428 [Urine:1375; Emesis/NG output:3; Blood:50] Intake/Output this shift: No intake/output data recorded.  No results for input(s): HGB in the last 72 hours. No results for input(s): WBC, RBC, HCT, PLT in the last 72 hours.  Recent Labs  07/16/17 0703  NA 138  K 3.6  CL 106  CO2 26  BUN 20  CREATININE 1.89*  GLUCOSE 159*  CALCIUM 9.1   No results for input(s): LABPT, INR in the last 72 hours.  Neurologically intact dressing intact. Good UO.  No results found.  Assessment/Plan: 1 Day Post-Op Procedure(s) (LRB): LEFT BELOW KNEE AMPUTATION (Left) Up with therapy  Keith Guzman 07/17/2017, 10:45 AM

## 2017-07-17 NOTE — Progress Notes (Addendum)
Physical Therapy Treatment Patient Details Name: Keith Guzman MRN: [id number] DOB: 28-Dec-1960 Today's Date: 07/17/2017    History of Present Illness Pt is a 56 y.o. male now s/p L BKA on 07/16/17. Pertinent PMH includes HTN, PVD, DM, CKD, cardiomyopathy, left mid foot amputation.    PT Comments    Pt progressing with mobility. Today's treatment session focused on bed mobility, transfer into standing, and working on balance/limits of stability with pt's new center of mass. Able to stand multiple times and take scooting steps with RW and min guard for balance. Educ on positioning to prevent hip/knee contractures and recommendation for continued rehab prior to d/c home; discussed potential use of w/c in addition to amb with AD. Pt very motivated to participate with therapies in hopes to progress to ambulating with a prosthetic in the future. Discussed continued therapy at CIR vs. SNF; pt and family agreeable to these. Updated recs to CIR-level therapies as I feel pt has the motivation, activity tolerance, and family support available. Will continue to follow acutely.   Follow Up Recommendations  CIR;Supervision for mobility/OOB     Equipment Recommendations  None recommended by PT (TBD)    Recommendations for Other Services OT consult     Precautions / Restrictions Precautions Precautions: Fall;Other (comment) Precaution Comments: Wound vac Restrictions Other Position/Activity Restrictions: L BKA    Mobility  Bed Mobility Overal bed mobility: Needs Assistance Bed Mobility: Sit to Supine;Supine to Sit     Supine to sit: Min guard;HOB elevated Sit to supine: Min guard;HOB elevated   General bed mobility comments: Min guard for supine<>sit, pt reliant on use of bed rails to elevate trunk into sitting. MinA to scoot up in bed for repositioning  Transfers Overall transfer level: Needs assistance Equipment used: Rolling walker (2 wheeled) Transfers: Sit to/from Stand Sit to Stand:  Min guard         General transfer comment: Stood x4 total with RW and min guard for balance; educ on hand placement and technique with RW. Pt with initial LOB back onto bed secondary to R knee buckling (reports he doesn't trust leg), but gaining confidence with repeated attempts, able to maintain standing >5 min and work on determining new center of balance with BUE support on RW  Ambulation/Gait Ambulation/Gait assistance: Min guard Ambulation Distance (Feet): 2 Feet Assistive device: Rolling walker (2 wheeled)       General Gait Details: Pt able to scoot R/L on RLE at EOB with RW and min guard for balance; unable to completely clear RLE for hop-to gait pattern secondary to lack of confidence with BUE support   Stairs            Wheelchair Mobility    Modified Rankin (Stroke Patients Only)       Balance Overall balance assessment: Needs assistance Sitting-balance support: Feet unsupported;No upper extremity supported Sitting balance-Leahy Scale: Good     Standing balance support: Bilateral upper extremity supported;During functional activity   Standing balance comment: Reliant on BUE support                            Cognition Arousal/Alertness: Awake/alert Behavior During Therapy: Flat affect Overall Cognitive Status: Within Functional Limits for tasks assessed  Exercises      General Comments General comments (skin integrity, edema, etc.): Wife, sister, and niece present during session      Pertinent Vitals/Pain Pain Assessment: No/denies pain    Home Living                      Prior Function            PT Goals (current goals can now be found in the care plan section) Acute Rehab PT Goals Patient Stated Goal: Receive rehab before returning home PT Goal Formulation: With patient Time For Goal Achievement: 07/30/17 Potential to Achieve Goals: Good Progress towards PT  goals: Progressing toward goals    Frequency    Min 3X/week      PT Plan Discharge plan needs to be updated;Frequency needs to be updated    Co-evaluation              AM-PAC PT "6 Clicks" Daily Activity  Outcome Measure  Difficulty turning over in bed (including adjusting bedclothes, sheets and blankets)?: A Little Difficulty moving from lying on back to sitting on the side of the bed? : A Little Difficulty sitting down on and standing up from a chair with arms (e.g., wheelchair, bedside commode, etc,.)?: A Little Help needed moving to and from a bed to chair (including a wheelchair)?: A Little Help needed walking in hospital room?: A Lot Help needed climbing 3-5 steps with a railing? : A Lot 6 Click Score: 16    End of Session Equipment Utilized During Treatment: Gait belt Activity Tolerance: Patient tolerated treatment well Patient left: in bed;with call bell/phone within reach;with family/visitor present Nurse Communication: Mobility status PT Visit Diagnosis: Muscle weakness (generalized) (M62.81);Difficulty in walking, not elsewhere classified (R26.2)     Time: [id number] PT Time Calculation (min) (ACUTE ONLY): 31 min  Charges:  $Therapeutic Exercise: 8-22 mins $Therapeutic Activity: 8-22 mins                    G Codes:      Ina HomesJaclyn Breelle Hollywood, PT, DPT Acute Rehab Services  Pager: [phone]  Malachy ChamberJaclyn L Azrael Maddix 07/17/2017, 4:38 PM

## 2017-07-18 ENCOUNTER — Encounter (HOSPITAL_COMMUNITY): Payer: Self-pay | Admitting: *Deleted

## 2017-07-18 LAB — GLUCOSE, CAPILLARY
Glucose-Capillary: 70 mg/dL (ref 65–99)
Glucose-Capillary: 165 mg/dL — ABNORMAL HIGH (ref 65–99)
Glucose-Capillary: 211 mg/dL — ABNORMAL HIGH (ref 65–99)
Glucose-Capillary: 145 mg/dL — ABNORMAL HIGH (ref 65–99)

## 2017-07-18 NOTE — Progress Notes (Signed)
Rehab Admissions Coordinator Note:  Patient was screened by Keith Guzman for appropriateness for an Inpatient Acute Rehab Consult per PT recommendation   At this time, we are recommending inpt rehab consult and OT eval, Please place orders.Keith Guzman 07/18/2017, 1:13 PM  I can be reached at [phone].

## 2017-07-18 NOTE — Progress Notes (Signed)
   Subjective: 2 Days Post-Op Procedure(s) (LRB): LEFT BELOW KNEE AMPUTATION (Left) Patient reports pain as mild.    Objective: Vital signs in last 24 hours: Temp:  [98.6 F (37 C)-99.5 F (37.5 C)] 99 F (37.2 C) (10/21 0524) Pulse Rate:  [65-101] 91 (10/21 0524) Resp:  [16-19] 19 (10/21 0524) BP: (153-182)/(84-87) 153/84 (10/21 0524) SpO2:  [98 %-99 %] 98 % (10/21 0524)  Intake/Output from previous day: 10/20 0701 - 10/21 0700 In: 237.8 [I.V.:237.8] Out: 900 [Urine:900] Intake/Output this shift: No intake/output data recorded.  No results for input(s): HGB in the last 72 hours. No results for input(s): WBC, RBC, HCT, PLT in the last 72 hours.  Recent Labs  07/16/17 0703  NA 138  K 3.6  CL 106  CO2 26  BUN 20  CREATININE 1.89*  GLUCOSE 159*  CALCIUM 9.1   No results for input(s): LABPT, INR in the last 72 hours.  Neurologically intact, dressing dry.. No results found.  Assessment/Plan: 2 Days Post-Op Procedure(s) (LRB): LEFT BELOW KNEE AMPUTATION (Left) Up with therapy, creatinine improved.   Eldred Manges 07/18/2017, 11:39 AM

## 2017-07-19 DIAGNOSIS — I5042 Chronic combined systolic (congestive) and diastolic (congestive) heart failure: Secondary | ICD-10-CM

## 2017-07-19 DIAGNOSIS — N183 Chronic kidney disease, stage 3 unspecified: Secondary | ICD-10-CM

## 2017-07-19 DIAGNOSIS — E119 Type 2 diabetes mellitus without complications: Secondary | ICD-10-CM

## 2017-07-19 DIAGNOSIS — D62 Acute posthemorrhagic anemia: Secondary | ICD-10-CM

## 2017-07-19 DIAGNOSIS — Z89512 Acquired absence of left leg below knee: Secondary | ICD-10-CM

## 2017-07-19 DIAGNOSIS — I10 Essential (primary) hypertension: Secondary | ICD-10-CM

## 2017-07-19 DIAGNOSIS — G8918 Other acute postprocedural pain: Secondary | ICD-10-CM

## 2017-07-19 LAB — GLUCOSE, CAPILLARY
GLUCOSE-CAPILLARY: 78 mg/dL (ref 65–99)
GLUCOSE-CAPILLARY: 133 mg/dL — AB (ref 65–99)
Glucose-Capillary: 200 mg/dL — ABNORMAL HIGH (ref 65–99)
Glucose-Capillary: 191 mg/dL — ABNORMAL HIGH (ref 65–99)

## 2017-07-19 NOTE — Progress Notes (Signed)
Inpatient Rehabilitation  I await OT evaluation prior to initiation of insurance authorization with [facility].  Met with patient to discuss team's recommendation for IP Rehab.  Shared booklets, insurance verification letter, and answered questions.  Plan to follow for timing of medical readiness, insurance authorization, and IP Rehab bed availability.  Please call with questions.   Carmelia Roller., CCC/SLP Admission Coordinator  [place]  Cell [phone]

## 2017-07-19 NOTE — Progress Notes (Signed)
OT Note - Addendum    07/19/17 1500  OT Visit Information  Last OT Received On 07/19/17  OT Time Calculation  OT Start Time (ACUTE ONLY) 1333  OT Stop Time (ACUTE ONLY) 1359  OT Time Calculation (min) 26 min  OT General Charges  $OT Visit 1 Visit  OT Evaluation  $OT Eval Moderate Complexity 1 Mod  OT Treatments  $Self Care/Home Management  8-22 mins  [facility], OT/L  [phone] 07/19/2017

## 2017-07-19 NOTE — Progress Notes (Signed)
Physical Therapy Treatment Patient Details Name: Keith Guzman MRN: [id number] DOB: [birth year] Today's Date: 07/19/2017    History of Present Illness Pt is a 57 y.o. male now s/p L BKA on 07/16/17. Pertinent PMH includes HTN, PVD, DM, CKD, cardiomyopathy, left mid foot amputation.     PT Comments    Session focused on bed mobility, transfers, and gait training with an emphasis to increase activity tolerance and improve patients confidence in singe leg stance. Pt able to ambulate 8 feet further using walker and min guard. At this time pt is utilizing a scooting pattern with RLE, unable to achieve RLE clearance for hop-to gait pattern when cued. Pt fatigues quickly with mobility, benefiting from intermittent rest breaks throughout session. Pt states he wishes to improve his endurence and strength. Educated pt on mechanics of gait and energy conservation. PT to follow.   Follow Up Recommendations  CIR;Supervision for mobility/OOB     Equipment Recommendations  None recommended by PT    Recommendations for Other Services       Precautions / Restrictions Precautions Precautions: Fall Restrictions Weight Bearing Restrictions: Yes Other Position/Activity Restrictions: L BKA    Mobility  Bed Mobility Overal bed mobility: Needs Assistance Bed Mobility: Sit to Supine;Supine to Sit     Supine to sit: Min guard;HOB elevated Sit to supine: Min guard;HOB elevated   General bed mobility comments: Min guard for supine<>sit, pt reliant on use of bed rails to elevate trunk into sitting. MinA to scoot up in bed for repositioning  Transfers Overall transfer level: Needs assistance Equipment used: Rolling walker (2 wheeled) Transfers: Sit to/from Stand Sit to Stand: Min guard;Min assist         General transfer comment: min guarding for balance sit<>stand with walker. required cueing for hand placement and techinque. After ambulation, pt required min assist back into bed due to fatigue.    Ambulation/Gait Ambulation/Gait assistance: Min guard Ambulation Distance (Feet): 10 Feet Assistive device: Rolling walker (2 wheeled)       General Gait Details: Attempts but unable to clear RLE for hop-to gait pattern at this time. heavy reliance with BUE support in walker.    Stairs            Wheelchair Mobility    Modified Rankin (Stroke Patients Only)       Balance   Sitting-balance support: No upper extremity supported Sitting balance-Leahy Scale: Good     Standing balance support: Bilateral upper extremity supported;During functional activity Standing balance-Leahy Scale: Poor Standing balance comment: Reliant on BUE support                            Cognition Arousal/Alertness: Awake/alert Behavior During Therapy: Flat affect Overall Cognitive Status: Within Functional Limits for tasks assessed                                        Exercises      General Comments General comments (skin integrity, edema, etc.): Became weak and fatigued when ambulating 10 feet. BP=175/89, SpO2=97% on RA after mobility.       Pertinent Vitals/Pain Pain Assessment: No/denies pain Pain Score: 0-No pain    Home Living                      Prior Function  PT Goals (current goals can now be found in the care plan section) Acute Rehab PT Goals Patient Stated Goal: Receive rehab before returning home PT Goal Formulation: With patient/family Time For Goal Achievement: 07/30/17 Potential to Achieve Goals: Good Progress towards PT goals: Progressing toward goals    Frequency    Min 3X/week      PT Plan Current plan remains appropriate    Co-evaluation              AM-PAC PT "6 Clicks" Daily Activity  Outcome Measure  Difficulty turning over in bed (including adjusting bedclothes, sheets and blankets)?: Unable Difficulty moving from lying on back to sitting on the side of the bed? :  Unable Difficulty sitting down on and standing up from a chair with arms (e.g., wheelchair, bedside commode, etc,.)?: Unable Help needed moving to and from a bed to chair (including a wheelchair)?: A Little Help needed walking in hospital room?: A Lot Help needed climbing 3-5 steps with a railing? : A Lot 6 Click Score: 10    End of Session Equipment Utilized During Treatment: Gait belt Activity Tolerance: Patient limited by fatigue Patient left: in bed;with call bell/phone within reach;with family/visitor present   PT Visit Diagnosis: Muscle weakness (generalized) (M62.81);Difficulty in walking, not elsewhere classified (R26.2) Pain - Right/Left: Left Pain - part of body: Leg;Knee     Time: 1030-1100 PT Time Calculation (min) (ACUTE ONLY): 30 min  Charges:  $Gait Training: 8-22 mins $Therapeutic Activity: 8-22 mins                    G Codes:       Etta GrandchildSean Sybella Harnish, PT, DPT Acute Rehab [phone]    Etta GrandchildSean  Neftali Thurow 07/19/2017, 11:33 AM

## 2017-07-19 NOTE — Therapy (Signed)
Occupational Therapy Evaluation Patient Details Name: Keith MastLouis M Mckell MRN: [id number] DOB: 03-08-1961 Today's Date: 07/19/2017    History of Present Illness Pt is a 56 y.o. male now s/p L BKA on 07/16/17. Pertinent PMH includes HTN, PVD, DM, CKD, cardiomyopathy, left mid foot amputation.    Clinical Impression   Pt reports being independent in ADLs PTA. Currently, pt requires min assist during functional mobility at RW level and LB ADLs and min guard to min assist for bed mobility. Pt reports family is able to provide 24 hour assistance as needed upon d/c home. Pt would benefit from CIR level therapy to facilitate return to PLOF. OT will follow acutely to address established goals.     Follow Up Recommendations  CIR;Supervision/Assistance - 24 hour    Equipment Recommendations  None recommended by OT    Recommendations for Other Services       Precautions / Restrictions Precautions Precautions: Fall Restrictions Weight Bearing Restrictions: Yes Other Position/Activity Restrictions: L BKA      Mobility Bed Mobility Overal bed mobility: Needs Assistance Bed Mobility: Sit to Supine;Supine to Sit     Supine to sit: Min guard;HOB elevated Sit to supine: Min guard;HOB elevated   General bed mobility comments: No physical assist required for LE management during bed mobility. HOB elevated. Min assist to scoot up in bed for repositioning.   Transfers Overall transfer level: Needs assistance Equipment used: Rolling walker (2 wheeled) Transfers: Sit to/from Stand Sit to Stand: Min guard         General transfer comment: Min guard for balance and verbal cues for hand placement.     Balance Overall balance assessment: Needs assistance Sitting-balance support: No upper extremity supported Sitting balance-Leahy Scale: Good Sitting balance - Comments: Pt able to sit EOB and bring right foot over knee to adjust sock with no LOB    Standing balance support: Bilateral upper  extremity supported;During functional activity Standing balance-Leahy Scale: Poor Standing balance comment: Reliant on BUE support                           ADL either performed or assessed with clinical judgement   ADL Overall ADL's : Needs assistance/impaired     Grooming: Minimal assistance   Upper Body Bathing: Supervision/ safety;Set up;Sitting   Lower Body Bathing: Minimal assistance;Sit to/from stand   Upper Body Dressing : Supervision/safety;Set up;Sitting   Lower Body Dressing: Minimal assistance;Sit to/from stand   Toilet Transfer: Minimal assistance;BSC;Ambulation StatisticianToilet Transfer Details (indicate cue type and reason): Simulated sit to stand from EOB         Functional mobility during ADLs: Minimal assistance;Rolling walker General ADL Comments: Pt currently able to bring right foot over knee to don/doff LB clothing with no LOB while sitting EOB.      Vision Baseline Vision/History: Legally blind (In left eye, decreased vision in right eye.) Patient Visual Report: No change from baseline       Perception     Praxis      Pertinent Vitals/Pain Pain Assessment: 0-10 Pain Score: 5  Pain Location: Left Leg Pain Descriptors / Indicators: Constant;Aching Pain Intervention(s): RN gave pain meds during session;Monitored during session;Repositioned     Hand Dominance Right   Extremity/Trunk Assessment Upper Extremity Assessment Upper Extremity Assessment: Overall WFL for tasks assessed   Lower Extremity Assessment Lower Extremity Assessment: Defer to PT evaluation   Cervical / Trunk Assessment Cervical / Trunk Assessment: Normal   Communication  Communication Communication: No difficulties   Cognition Arousal/Alertness: Awake/alert Behavior During Therapy: Flat affect Overall Cognitive Status: Within Functional Limits for tasks assessed                                     General Comments       Exercises     Shoulder  Instructions      Home Living Family/patient expects to be discharged to:: Private residence Living Arrangements: Spouse/significant other;Children;Other relatives Available Help at Discharge: Family;Available 24 hours/day Type of Home: House Home Access: Stairs to enter [employer] of Steps: 2 Entrance Stairs-Rails: None Home Layout: One level     Bathroom Shower/Tub: IT trainer: Standard Bathroom Accessibility: No   Home Equipment: Environmental consultant - 2 wheels   Additional Comments: Pt reports he lives with his wife, son, daughter in law, and 2 grandchildren.       Prior Functioning/Environment Level of Independence: Independent        Comments: Mod I with hx of midtarsal amputee on L. Pt enjoys sitting on the front porch.         OT Problem List: Decreased safety awareness;Decreased knowledge of use of DME or AE;Decreased knowledge of precautions;Pain;Decreased activity tolerance;Decreased strength      OT Treatment/Interventions: Self-care/ADL training;DME and/or AE instruction;Therapeutic activities;Patient/family education;Balance training    OT Goals(Current goals can be found in the care plan section) Acute Rehab OT Goals Patient Stated Goal: Receive rehab before returning home OT Goal Formulation: With patient Time For Goal Achievement: 08/02/17 Potential to Achieve Goals: Good ADL Goals Pt Will Perform Grooming: standing;with modified independence Pt Will Perform Lower Body Bathing: sit to/from stand;with modified independence Pt Will Perform Lower Body Dressing: sit to/from stand;with modified independence Pt Will Transfer to Toilet: ambulating;regular height toilet;with modified independence (RW ) Pt Will Perform Toileting - Clothing Manipulation and hygiene: sit to/from stand;with modified independence Pt Will Perform Tub/Shower Transfer: Tub transfer;with modified independence;ambulating;3 in 1;rolling walker  OT  Frequency: Min 2X/week   Barriers to D/C:            Co-evaluation              AM-PAC PT "6 Clicks" Daily Activity     Outcome Measure Help from another person eating meals?: None Help from another person taking care of personal grooming?: None Help from another person toileting, which includes using toliet, bedpan, or urinal?: A Little Help from another person bathing (including washing, rinsing, drying)?: A Little Help from another person to put on and taking off regular upper body clothing?: None Help from another person to put on and taking off regular lower body clothing?: A Little 6 Click Score: 21   End of Session Equipment Utilized During Treatment: Gait belt;Rolling walker Nurse Communication: Mobility status  Activity Tolerance: Patient tolerated treatment well Patient left: in bed;with call bell/phone within reach;with nursing/sitter in room  OT Visit Diagnosis: Other abnormalities of gait and mobility (R26.89);Pain Pain - Right/Left: Left Pain - part of body: Leg                Time: 5916-3846 OT Time Calculation (min): 26 min Charges:    G-Codes:     Cammy Copa, OTS [phone]   Cammy Copa 07/19/2017, 3:44 PM

## 2017-07-19 NOTE — Progress Notes (Signed)
Patient ID: Keith Guzman, male   DOB: Mar 31, 1961, 56 y.o.   MRN: [id number] Patient states that he is doing well with therapy states that he wants to go home.  Therapy has recommended inpatient rehabilitation.  Inpatient rehabilitation consult.  Wound VAC has no drainage.
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
## 2017-07-20 ENCOUNTER — Inpatient Hospital Stay (HOSPITAL_COMMUNITY)
Admission: RE | Admit: 2017-07-20 | Discharge: 2017-07-31 | DRG: 560 | Disposition: A | Discharge status: Home Health | Payer: 59 | Source: Intra-hospital | Attending: Physical Medicine & Rehabilitation | Admitting: Physical Medicine & Rehabilitation

## 2017-07-20 ENCOUNTER — Encounter (HOSPITAL_COMMUNITY): Payer: Self-pay | Admitting: *Deleted

## 2017-07-20 DIAGNOSIS — E1151 Type 2 diabetes mellitus with diabetic peripheral angiopathy without gangrene: Secondary | ICD-10-CM | POA: Diagnosis present

## 2017-07-20 DIAGNOSIS — Z4781 Encounter for orthopedic aftercare following surgical amputation: Secondary | ICD-10-CM | POA: Diagnosis not present

## 2017-07-20 DIAGNOSIS — Z79899 Other long term (current) drug therapy: Secondary | ICD-10-CM | POA: Diagnosis not present

## 2017-07-20 DIAGNOSIS — E8809 Other disorders of plasma-protein metabolism, not elsewhere classified: Secondary | ICD-10-CM | POA: Diagnosis present

## 2017-07-20 DIAGNOSIS — N183 Chronic kidney disease, stage 3 unspecified: Secondary | ICD-10-CM

## 2017-07-20 DIAGNOSIS — E1142 Type 2 diabetes mellitus with diabetic polyneuropathy: Secondary | ICD-10-CM

## 2017-07-20 DIAGNOSIS — N179 Acute kidney failure, unspecified: Secondary | ICD-10-CM | POA: Diagnosis not present

## 2017-07-20 DIAGNOSIS — I10 Essential (primary) hypertension: Secondary | ICD-10-CM | POA: Diagnosis not present

## 2017-07-20 DIAGNOSIS — Z91018 Allergy to other foods: Secondary | ICD-10-CM | POA: Diagnosis not present

## 2017-07-20 DIAGNOSIS — Z89512 Acquired absence of left leg below knee: Secondary | ICD-10-CM | POA: Diagnosis not present

## 2017-07-20 DIAGNOSIS — Z91013 Allergy to seafood: Secondary | ICD-10-CM

## 2017-07-20 DIAGNOSIS — E46 Unspecified protein-calorie malnutrition: Secondary | ICD-10-CM | POA: Diagnosis not present

## 2017-07-20 DIAGNOSIS — I129 Hypertensive chronic kidney disease with stage 1 through stage 4 chronic kidney disease, or unspecified chronic kidney disease: Secondary | ICD-10-CM | POA: Diagnosis present

## 2017-07-20 DIAGNOSIS — D649 Anemia, unspecified: Secondary | ICD-10-CM | POA: Diagnosis present

## 2017-07-20 DIAGNOSIS — Z87891 Personal history of nicotine dependence: Secondary | ICD-10-CM

## 2017-07-20 DIAGNOSIS — D62 Acute posthemorrhagic anemia: Secondary | ICD-10-CM | POA: Diagnosis not present

## 2017-07-20 DIAGNOSIS — D638 Anemia in other chronic diseases classified elsewhere: Secondary | ICD-10-CM | POA: Diagnosis not present

## 2017-07-20 DIAGNOSIS — S88112A Complete traumatic amputation at level between knee and ankle, left lower leg, initial encounter: Secondary | ICD-10-CM

## 2017-07-20 DIAGNOSIS — K5900 Constipation, unspecified: Secondary | ICD-10-CM | POA: Diagnosis not present

## 2017-07-20 DIAGNOSIS — Z7982 Long term (current) use of aspirin: Secondary | ICD-10-CM | POA: Diagnosis not present

## 2017-07-20 DIAGNOSIS — E11649 Type 2 diabetes mellitus with hypoglycemia without coma: Secondary | ICD-10-CM | POA: Diagnosis not present

## 2017-07-20 DIAGNOSIS — Z841 Family history of disorders of kidney and ureter: Secondary | ICD-10-CM | POA: Diagnosis not present

## 2017-07-20 DIAGNOSIS — S88112S Complete traumatic amputation at level between knee and ankle, left lower leg, sequela: Secondary | ICD-10-CM | POA: Diagnosis not present

## 2017-07-20 DIAGNOSIS — K5901 Slow transit constipation: Secondary | ICD-10-CM | POA: Diagnosis not present

## 2017-07-20 DIAGNOSIS — I82402 Acute embolism and thrombosis of unspecified deep veins of left lower extremity: Secondary | ICD-10-CM | POA: Diagnosis not present

## 2017-07-20 DIAGNOSIS — E785 Hyperlipidemia, unspecified: Secondary | ICD-10-CM | POA: Diagnosis present

## 2017-07-20 DIAGNOSIS — E1122 Type 2 diabetes mellitus with diabetic chronic kidney disease: Secondary | ICD-10-CM | POA: Diagnosis present

## 2017-07-20 DIAGNOSIS — M79605 Pain in left leg: Secondary | ICD-10-CM | POA: Diagnosis not present

## 2017-07-20 LAB — GLUCOSE, CAPILLARY
GLUCOSE-CAPILLARY: 101 mg/dL — AB (ref 65–99)
GLUCOSE-CAPILLARY: 202 mg/dL — AB (ref 65–99)
GLUCOSE-CAPILLARY: 241 mg/dL — AB (ref 65–99)
Glucose-Capillary: 141 mg/dL — ABNORMAL HIGH (ref 65–99)

## 2017-07-20 MED ORDER — TAB-A-VITE/IRON PO TABS
1.0000 | ORAL_TABLET | Freq: Every day | ORAL | Status: DC
  Administered 2017-07-21 – 2017-07-31 (×11): 1 via ORAL
  Filled 2017-07-20 (×11): qty 1

## 2017-07-20 MED ORDER — OXYCODONE HCL 5 MG PO TABS
5.0000 mg | ORAL_TABLET | ORAL | Status: DC | PRN
  Administered 2017-07-22 – 2017-07-25 (×7): 10 mg via ORAL
  Filled 2017-07-20 (×7): qty 2

## 2017-07-20 MED ORDER — ONDANSETRON HCL 4 MG PO TABS
4.0000 mg | ORAL_TABLET | Freq: Four times a day (QID) | ORAL | Status: DC | PRN
  Administered 2017-07-27: 4 mg via ORAL
  Filled 2017-07-20: qty 1

## 2017-07-20 MED ORDER — ASPIRIN EC 81 MG PO TBEC
81.0000 mg | DELAYED_RELEASE_TABLET | Freq: Every day | ORAL | Status: DC
  Administered 2017-07-21 – 2017-07-31 (×11): 81 mg via ORAL
  Filled 2017-07-20 (×11): qty 1

## 2017-07-20 MED ORDER — BISACODYL 10 MG RE SUPP
10.0000 mg | Freq: Every day | RECTAL | Status: DC | PRN
  Administered 2017-07-26 – 2017-07-27 (×2): 10 mg via RECTAL
  Filled 2017-07-20 (×4): qty 1

## 2017-07-20 MED ORDER — SORBITOL 70 % SOLN
30.0000 mL | Freq: Every day | Status: DC | PRN
  Administered 2017-07-21 – 2017-07-26 (×2): 30 mL via ORAL
  Filled 2017-07-20 (×2): qty 30

## 2017-07-20 MED ORDER — INSULIN ASPART 100 UNIT/ML ~~LOC~~ SOLN
3.0000 [IU] | Freq: Three times a day (TID) | SUBCUTANEOUS | Status: DC
  Administered 2017-07-20 – 2017-07-31 (×19): 3 [IU] via SUBCUTANEOUS

## 2017-07-20 MED ORDER — ACETAMINOPHEN 325 MG PO TABS
650.0000 mg | ORAL_TABLET | ORAL | Status: DC | PRN
  Administered 2017-07-23 – 2017-07-31 (×8): 650 mg via ORAL
  Filled 2017-07-20 (×9): qty 2

## 2017-07-20 MED ORDER — INSULIN ASPART 100 UNIT/ML ~~LOC~~ SOLN
0.0000 [IU] | Freq: Three times a day (TID) | SUBCUTANEOUS | Status: DC
  Administered 2017-07-20: 3 [IU] via SUBCUTANEOUS
  Administered 2017-07-21: 2 [IU] via SUBCUTANEOUS
  Administered 2017-07-21: 1 [IU] via SUBCUTANEOUS
  Administered 2017-07-22: 2 [IU] via SUBCUTANEOUS
  Administered 2017-07-22: 1 [IU] via SUBCUTANEOUS
  Administered 2017-07-23 (×2): 2 [IU] via SUBCUTANEOUS
  Administered 2017-07-24: 1 [IU] via SUBCUTANEOUS
  Administered 2017-07-24 – 2017-07-25 (×3): 2 [IU] via SUBCUTANEOUS
  Administered 2017-07-25: 1 [IU] via SUBCUTANEOUS
  Administered 2017-07-26: 2 [IU] via SUBCUTANEOUS
  Administered 2017-07-27 (×2): 3 [IU] via SUBCUTANEOUS
  Administered 2017-07-28: 2 [IU] via SUBCUTANEOUS
  Administered 2017-07-29 – 2017-07-30 (×3): 1 [IU] via SUBCUTANEOUS

## 2017-07-20 MED ORDER — METHOCARBAMOL 500 MG PO TABS
500.0000 mg | ORAL_TABLET | Freq: Four times a day (QID) | ORAL | Status: DC | PRN
  Administered 2017-07-22 – 2017-07-31 (×10): 500 mg via ORAL
  Filled 2017-07-20 (×10): qty 1

## 2017-07-20 MED ORDER — CARVEDILOL 25 MG PO TABS
25.0000 mg | ORAL_TABLET | Freq: Two times a day (BID) | ORAL | Status: DC
  Administered 2017-07-20 – 2017-07-31 (×21): 25 mg via ORAL
  Filled 2017-07-20 (×23): qty 1

## 2017-07-20 MED ORDER — ONDANSETRON HCL 4 MG/2ML IJ SOLN: 4.0000 mg | Freq: Four times a day (QID) | INTRAMUSCULAR | Status: DC | PRN

## 2017-07-20 MED ORDER — POLYETHYLENE GLYCOL 3350 17 G PO PACK
17.0000 g | PACK | Freq: Every day | ORAL | Status: DC | PRN
  Administered 2017-07-25: 17 g via ORAL
  Filled 2017-07-20 (×2): qty 1

## 2017-07-20 MED ORDER — INSULIN GLARGINE 100 UNIT/ML ~~LOC~~ SOLN
15.0000 [IU] | Freq: Every day | SUBCUTANEOUS | Status: DC
  Administered 2017-07-20 – 2017-07-22 (×3): 15 [IU] via SUBCUTANEOUS
  Filled 2017-07-20 (×4): qty 0.15

## 2017-07-20 MED ORDER — METHOCARBAMOL 1000 MG/10ML IJ SOLN: 500.0000 mg | Freq: Four times a day (QID) | INTRAVENOUS | Status: DC | PRN

## 2017-07-20 MED ORDER — DOCUSATE SODIUM 100 MG PO CAPS
100.0000 mg | ORAL_CAPSULE | Freq: Two times a day (BID) | ORAL | Status: DC
  Administered 2017-07-20 – 2017-07-26 (×12): 100 mg via ORAL
  Filled 2017-07-20 (×14): qty 1

## 2017-07-20 MED ORDER — ATORVASTATIN CALCIUM 80 MG PO TABS
80.0000 mg | ORAL_TABLET | Freq: Every day | ORAL | Status: DC
  Administered 2017-07-20 – 2017-07-30 (×10): 80 mg via ORAL
  Filled 2017-07-20 (×12): qty 1

## 2017-07-20 MED ORDER — ACETAMINOPHEN 650 MG RE SUPP
650.0000 mg | RECTAL | Status: DC | PRN
  Administered 2017-07-27: 650 mg via RECTAL
  Filled 2017-07-20: qty 1

## 2017-07-20 MED ORDER — GLIPIZIDE ER 10 MG PO TB24
10.0000 mg | ORAL_TABLET | Freq: Every day | ORAL | Status: DC
  Administered 2017-07-21 – 2017-07-31 (×10): 10 mg via ORAL
  Filled 2017-07-20 (×12): qty 1

## 2017-07-20 MED ORDER — DARBEPOETIN ALFA 300 MCG/0.6ML IJ SOSY
300.0000 ug | PREFILLED_SYRINGE | INTRAMUSCULAR | Status: DC
Start: 2017-08-12 — End: 2017-07-31

## 2017-07-20 NOTE — Progress Notes (Signed)
Removed IV, all questions and concerns addressed, gave report to Doree Fudge, RN, transferred to inpatient rehab 4W with Pt belongings.
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
## 2017-07-20 NOTE — Progress Notes (Signed)
Inpatient Rehabilitation  I have received insurance authorization, acute medical clearance, and have an IP Rehab bed to offer to patient today.  Will proceed with admission today and have updated team.  Call if questions.   Charlane Ferretti., CCC/SLP Admission Coordinator  [facility] Inpatient Rehabilitation  Cell [phone]

## 2017-07-20 NOTE — IPOC Note (Signed)
Overall Plan of Care [facility](IPOC) Patient Details Name: Keith Guzman MRN: [id number] DOB: 02/03/1961  Admitting Diagnosis: Left BKA  Hospital Problems: Active Problems:   Amputation of left lower extremity below knee (HCC)   Unilateral complete BKA, left, sequela (HCC)   Type 2 diabetes mellitus with peripheral neuropathy (HCC)   Hypoalbuminemia due to protein-calorie malnutrition [facility](HCC)     Functional Problem List: Nursing Bladder, Bowel, Endurance, Motor, Safety, Pain, Skin Integrity  PT Balance, Edema, Endurance, Motor, Pain, Perception, Sensory, Skin Integrity  OT Balance, Endurance, Pain, Safety  SLP    TR         Basic ADL's: OT Grooming, Bathing, Dressing, Toileting     Advanced  ADL's: OT Simple Meal Preparation     Transfers: PT Bed Mobility, Bed to Chair, Car, Furniture, Floor  OT Toilet, Research scientist (life sciences)Tub/Shower     Locomotion: PT Ambulation, Psychologist, prison and probation servicesWheelchair Mobility, Stairs     Additional Impairments: OT    SLP        TR      Anticipated Outcomes Item Anticipated Outcome  Self Feeding independent  Swallowing      Basic self-care  supervision to modified independent  Toileting  supervision to modified independent   Bathroom Transfers supervision to modified independent  Bowel/Bladder  Continent to bowel and bladder with min. assist.  Transfers  mod I  Locomotion  supervision  Communication     Cognition     Pain  Less than 3,on 1 to 10 scale  Safety/Judgment  Free from falls during his time in rehab   Therapy Plan: PT Intensity: Minimum of 1-2 x/day ,45 to 90 minutes PT Frequency: 5 out of 7 days PT Duration Estimated Length of Stay: 7-9 days OT Intensity: Minimum of 1-2 x/day, 45 to 90 minutes OT Frequency: 5 out of 7 days OT Duration/Estimated Length of Stay: 7-9 days      Team Interventions: Nursing Interventions Patient/Family Education, Bladder Management, Bowel Management, Pain Management, Skin Care/Wound Management, Discharge Planning  PT  interventions Ambulation/gait training, Warden/rangerBalance/vestibular training, Community reintegration, Equities traderatient/family education, Museum/gallery curatortair training, UE/LE Coordination activities, UE/LE Strength taining/ROM, Pain management, DME/adaptive equipment instruction, Disease management/prevention, Neuromuscular re-education, Skin care/wound management, Therapeutic Exercise, Wheelchair propulsion/positioning, Therapeutic Activities, Functional mobility training, Discharge planning  OT Interventions Balance/vestibular training, Cognitive remediation/compensation, Functional mobility training, Pain management, Self Care/advanced ADL retraining, Therapeutic Activities, Therapeutic Exercise, UE/LE Strength taining/ROM, Neuromuscular re-education, DME/adaptive equipment instruction, Discharge planning, Disease mangement/prevention, Wheelchair propulsion/positioning  SLP Interventions    TR Interventions    SW/CM Interventions Discharge Planning, Psychosocial Support, Patient/Family Education   Barriers to Discharge MD  Medical stability  Nursing      PT Decreased caregiver support, Home environment access/layout    OT Decreased caregiver support Spouse and son both work  SLP      SW Decreased caregiver support Does not have 24 hr care-wife works    Occupational psychologistTeam Discharge Planning: Destination: PT-Home ,OT- Home , SLP-  Projected Follow-up: PT-Home health PT, OT-  Home health OT, SLP-  Projected Equipment Needs: PT-To be determined, OT- 3 in 1 bedside comode, Tub/shower bench, SLP-  Equipment Details: PT- , OT-  Patient/family involved in discharge planning: PT- Patient,  OT-Patient, SLP-   MD ELOS: 6-9 days. Medical Rehab Prognosis:  Good Assessment: 56 y.o.right hand malewith history of cardiomyopathy with ejection fraction 25%, CKD stage III, diabetes mellitus, hypertension,remote tobacco abuse,left transmetatarsal amputation 12/14/2012. Presented 07/17/2007 with gangrenous changes left lower extremity and patient  has undergone recent revascularization procedures. Limb was  not felt to be salvageable. Underwent left BKA 07/16/2017 per Dr. Lajoyce Corners. Hospital course pain management. Wound VAC remained in place. Patient with resulting functional deficits with mobility, transfers.  Will set goals for Supervision/Mod I with PT/OT.   See Team Conference Notes for weekly updates to the plan of care
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
## 2017-07-20 NOTE — Progress Notes (Signed)
Patient ID: Keith Guzman, male   DOB: 1961-04-06, 56 y.o.   MRN: [id number] Occupational Therapy has also recommended inpatient rehabilitation.  Awaiting insurance authorization.  Patient without complaints this morning.  Wound VAC without drainage.
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
## 2017-07-20 NOTE — Progress Notes (Signed)
Inpatient Rehabilitation  I await [employer]'s decision for IP Rehab admission; hopeful for a decision today.  Plan to follow up with the team as I know.  Call if questions.   Charlane Ferretti., CCC/SLP Admission Coordinator  [facility] Inpatient Rehabilitation  Cell [phone]

## 2017-07-20 NOTE — Discharge Summary (Signed)
Discharge Diagnoses:  Active Problems:   S/P below knee amputation, left (HCC)   S/P unilateral BKA (below knee amputation), left (HCC)   Chronic combined systolic and diastolic congestive heart failure (HCC)   Stage 3 chronic kidney disease (HCC)   Diabetes mellitus type 2 in nonobese (HCC)   Benign essential HTN   Acute blood loss anemia   Post-operative pain   Surgeries: Procedure(s): LEFT BELOW KNEE AMPUTATION on 07/16/2017    Consultants:   Discharged Condition: Improved  Hospital Course: Keith Guzman is an 56 y.o. male who was admitted 07/16/2017 with a chief complaint of osteomyelitis left foot, with a final diagnosis of Osteomyelitis left foot.  Patient was brought to the operating room on 07/16/2017 and underwent Procedure(s): LEFT BELOW KNEE AMPUTATION.    Patient was given perioperative antibiotics: Anti-infectives    Start     Dose/Rate Route Frequency Ordered Stop   07/16/17 1345  ceFAZolin (ANCEF) IVPB 1 g/50 mL premix     1 g 100 mL/hr over 30 Minutes Intravenous Every 6 hours 07/16/17 1116 07/17/17 0148   07/16/17 0512  ceFAZolin (ANCEF) IVPB 2g/100 mL premix     2 g 200 mL/hr over 30 Minutes Intravenous On call to O.R. 07/16/17 8299 07/16/17 0745    .  Patient was given sequential compression devices, early ambulation, and aspirin for DVT prophylaxis.  Recent vital signs: Patient Vitals for the past 24 hrs:  BP Temp Temp src Pulse Resp SpO2  07/20/17 0300 (!) 158/81 99 F (37.2 C) Oral 88 17 96 %  07/19/17 2230 (!) 178/92 99 F (37.2 C) Oral 91 17 95 %  07/19/17 1500 (!) 166/89 98.4 F (36.9 C) Oral 91 16 100 %  .  Recent laboratory studies: No results found.  Discharge Medications:   Allergies as of 07/20/2017      Reactions   Pork-derived Products Other (See Comments)   Due to religion   Shellfish Allergy Other (See Comments)   Due to religion      Medication List    TAKE these medications   acetaminophen 500 MG tablet Commonly known as:   TYLENOL Take 500 mg by mouth daily as needed for moderate pain. Per bottle as needed for pain   aspirin EC 81 MG tablet Take 1 tablet (81 mg total) by mouth daily.   atorvastatin 80 MG tablet Commonly known as:  LIPITOR TAKE 1 TABLET (80 MG TOTAL) BY MOUTH DAILY.   BD ULTRA-FINE PEN NEEDLES 29G X 12.7MM Misc Generic drug:  Insulin Pen Needle USE TO INJECT INSULIN ONCE DAILY   carvedilol 12.5 MG tablet Commonly known as:  COREG TAKE 1 TABLET BY MOUTH TWICE A DAY WITH A MEAL. PT NEEDS OFFICE VISIT.   Darbepoetin Alfa 300 MCG/ML Soln Inject 300 mcg as directed every 30 (thirty) days.   glipiZIDE 10 MG 24 hr tablet Commonly known as:  GLUCOTROL XL Take 10 mg by mouth daily with breakfast.   INTEGRA PLUS Caps TAKE 1 CAPSULE BY MOUTH DAILY.   LANTUS SOLOSTAR 100 UNIT/ML Solostar Pen Generic drug:  Insulin Glargine Inject 18 Units into the skin at bedtime. Increasing dose to get a blood sugar of 120   multivitamin with minerals Tabs tablet Take 1 tablet by mouth daily.   mupirocin ointment 2 % Commonly known as:  BACTROBAN Apply to effected area bid daily.   ONE TOUCH ULTRA TEST test strip Generic drug:  glucose blood       Diagnostic Studies:  Xr Foot 2 Views Left  Result Date: 07/13/2017 Two-view radiographs of the left foot shows the silver nitrate extending down to the cuboid bone with lytic changes.   Patient benefited maximally from their hospital stay and there were no complications.     Disposition: 06-Home-Health Care Svc Discharge Instructions    Call MD / Call 911    Complete by:  As directed    If you experience chest pain or shortness of breath, CALL 911 and be transported to the hospital emergency room.  If you develope a fever above 101 F, pus (white drainage) or increased drainage or redness at the wound, or calf pain, call your surgeon's office.   Constipation Prevention    Complete by:  As directed    Drink plenty of fluids.  Prune juice may be  helpful.  You may use a stool softener, such as Colace (over the counter) 100 mg twice a day.  Use MiraLax (over the counter) for constipation as needed.   Diet - low sodium heart healthy    Complete by:  As directed    Increase activity slowly as tolerated    Complete by:  As directed         Signed: Nadara MustardMarcus V Duda 07/20/2017, 1:08 PM
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
## 2017-07-20 NOTE — Progress Notes (Signed)
Received pt. As a transfer from [facility] [address]. Pt. And his wife have been oriented to the unit routine.Safety plan was explained,fall prevention plan was explained and signed.

## 2017-07-21 ENCOUNTER — Inpatient Hospital Stay (HOSPITAL_COMMUNITY): Payer: 59 | Admitting: Occupational Therapy

## 2017-07-21 ENCOUNTER — Inpatient Hospital Stay (HOSPITAL_COMMUNITY): Payer: 59

## 2017-07-21 DIAGNOSIS — E46 Unspecified protein-calorie malnutrition: Secondary | ICD-10-CM

## 2017-07-21 DIAGNOSIS — S88112S Complete traumatic amputation at level between knee and ankle, left lower leg, sequela: Secondary | ICD-10-CM

## 2017-07-21 DIAGNOSIS — N183 Chronic kidney disease, stage 3 (moderate): Secondary | ICD-10-CM

## 2017-07-21 DIAGNOSIS — D638 Anemia in other chronic diseases classified elsewhere: Secondary | ICD-10-CM

## 2017-07-21 DIAGNOSIS — D62 Acute posthemorrhagic anemia: Secondary | ICD-10-CM

## 2017-07-21 DIAGNOSIS — I10 Essential (primary) hypertension: Secondary | ICD-10-CM

## 2017-07-21 DIAGNOSIS — E1142 Type 2 diabetes mellitus with diabetic polyneuropathy: Secondary | ICD-10-CM

## 2017-07-21 DIAGNOSIS — E8809 Other disorders of plasma-protein metabolism, not elsewhere classified: Secondary | ICD-10-CM

## 2017-07-21 DIAGNOSIS — Z89512 Acquired absence of left leg below knee: Secondary | ICD-10-CM

## 2017-07-21 LAB — COMPREHENSIVE METABOLIC PANEL
ALK PHOS: 65 U/L (ref 38–126)
ALT: 9 U/L — AB (ref 17–63)
AST: 19 U/L (ref 15–41)
Albumin: 2.4 g/dL — ABNORMAL LOW (ref 3.5–5.0)
Anion gap: 6 (ref 5–15)
BILIRUBIN TOTAL: 0.6 mg/dL (ref 0.3–1.2)
BUN: 20 mg/dL (ref 6–20)
CALCIUM: 7.8 mg/dL — AB (ref 8.9–10.3)
CHLORIDE: 106 mmol/L (ref 101–111)
CO2: 25 mmol/L (ref 22–32)
CREATININE: 1.74 mg/dL — AB (ref 0.61–1.24)
GFR, EST AFRICAN AMERICAN: 48 mL/min — AB (ref >60)
GFR, EST NON AFRICAN AMERICAN: 41 mL/min — AB (ref >60)
Glucose, Bld: 182 mg/dL — ABNORMAL HIGH (ref 65–99)
Potassium: 3.7 mmol/L (ref 3.5–5.1)
Sodium: 137 mmol/L (ref 135–145)
TOTAL PROTEIN: 5.6 g/dL — AB (ref 6.5–8.1)

## 2017-07-21 LAB — GLUCOSE, CAPILLARY
GLUCOSE-CAPILLARY: 168 mg/dL — AB (ref 65–99)
GLUCOSE-CAPILLARY: 105 mg/dL — AB (ref 65–99)
Glucose-Capillary: 131 mg/dL — ABNORMAL HIGH (ref 65–99)
Glucose-Capillary: 183 mg/dL — ABNORMAL HIGH (ref 65–99)

## 2017-07-21 LAB — CBC WITH DIFFERENTIAL/PLATELET
BASOS ABS: 0 10*3/uL (ref 0.0–0.1)
Basophils Relative: 0 %
Eosinophils Absolute: 0.4 10*3/uL (ref 0.0–0.7)
Eosinophils Relative: 4 %
HEMATOCRIT: 28.5 % — AB (ref 39.0–52.0)
HEMOGLOBIN: 8.4 g/dL — AB (ref 13.0–17.0)
LYMPHS ABS: 1.5 10*3/uL (ref 0.7–4.0)
Lymphocytes Relative: 16 %
MCH: 23.5 pg — AB (ref 26.0–34.0)
MCHC: 29.5 g/dL — ABNORMAL LOW (ref 30.0–36.0)
MCV: 79.6 fL (ref 78.0–100.0)
MONO ABS: 1 10*3/uL (ref 0.1–1.0)
MONOS PCT: 11 %
Neutro Abs: 6.1 10*3/uL (ref 1.7–7.7)
Neutrophils Relative %: 69 %
PLATELETS: 356 10*3/uL (ref 150–400)
RBC: 3.58 MIL/uL — AB (ref 4.22–5.81)
RDW: 16.7 % — ABNORMAL HIGH (ref 11.5–15.5)
WBC: 9 10*3/uL (ref 4.0–10.5)

## 2017-07-21 MED ORDER — PRO-STAT SUGAR FREE PO LIQD
30.0000 mL | Freq: Two times a day (BID) | ORAL | Status: DC
  Administered 2017-07-21 – 2017-07-31 (×15): 30 mL via ORAL
  Filled 2017-07-21 (×17): qty 30

## 2017-07-21 NOTE — Care Management Note (Signed)
Inpatient Rehabilitation Center Individual Statement of Services  Patient Name:  Keith Guzman  Date:  07/21/2017  Welcome to the Inpatient Rehabilitation Center.  Our goal is to provide you with an individualized program based on your diagnosis and situation, designed to meet your specific needs.  With this comprehensive rehabilitation program, you will be expected to participate in at least 3 hours of rehabilitation therapies Monday-Friday, with modified therapy programming on the weekends.  Your rehabilitation program will include the following services:  Physical Therapy (PT), Occupational Therapy (OT), 24 hour per day rehabilitation nursing, Case Management (Social Worker), Rehabilitation Medicine, Nutrition Services and Pharmacy Services  Weekly team conferences will be held on Wednesday to discuss your progress.  Your Social Worker will talk with you frequently to get your input and to update you on team discussions.  Team conferences with you and your family in attendance may also be held.  Expected length of stay: 7-9 days  Overall anticipated outcome: supervision-ambulation and mod/i wheelchair level  Depending on your progress and recovery, your program may change. Your Social Worker will coordinate services and will keep you informed of any changes. Your Social Worker's name and contact numbers are listed  below.  The following services may also be recommended but are not provided by the Inpatient Rehabilitation Center:   Driving Evaluations  Home Health Rehabiltiation Services  Outpatient Rehabilitation Services    Arrangements will be made to provide these services after discharge if needed.  Arrangements include referral to agencies that provide these services.  Your insurance has been verified to be:  [facility] & Medicare A&B Your primary doctor is:  Farris Has  Pertinent information will be shared with your doctor and your insurance company.  Social Worker:  Dossie Der,  SW [phone] or ([phone]  Information discussed with and copy given to patient by: Lucy Chris, 07/21/2017, 9:33 AM

## 2017-07-21 NOTE — Progress Notes (Signed)
Patient information reviewed and entered into eRehab system by Mililani Murthy, RN, CRRN, PPS Coordinator.  Information including medical coding and functional independence measure will be reviewed and updated through discharge.     Per nursing patient was given "Data Collection Information Summary for Patients in Inpatient Rehabilitation Facilities with attached "Privacy Act Statement-Health Care Records" upon admission.  
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
## 2017-07-21 NOTE — Progress Notes (Signed)
Social Work Patient ID: Keith Guzman, male   DOB: [birth year], 56 y.o.   MRN: [id number]  Met with pt to inform team conference goals mod/i level and target discharge 11/3. He is exhausted from therapies today. He hopes each day he becomes stronger while here. Work on discharge needs.

## 2017-07-21 NOTE — Progress Notes (Signed)
Team Conference noted for today. Pt resting in bed quietly. Easily aroused.  Admitted for L BKA with continuous wound vac therapy at . stump shrinker noted and pt denies any pain at this time. Continent of b/b and made several attempts to have a bm and was unsuccessful. Pt educated and offered a suppository and refused. Safety maintained. callbell within reach. Will continue to monitor.

## 2017-07-21 NOTE — Progress Notes (Signed)
Keith Guzman PHYSICAL MEDICINE & REHABILITATION     PROGRESS NOTE  Subjective/Complaints:  Pt seen sitting at EOB this AM.  He slept well overnight.  He would like to know what he needs to do before therapies start.   ROS: Denies CP, SOB, N/V/D.  Objective: Vital Signs: Blood pressure (!) 149/72, pulse 92, temperature 98.7 F (37.1 C), temperature source Oral, resp. rate 16, height 5\' 11"  (1.803 m), weight 74.4 kg (164 lb 0.3 oz), SpO2 99 %. No results found.  Recent Labs  07/21/17 0529  WBC 9.0  HGB 8.4*  HCT 28.5*  PLT 356    Recent Labs  07/21/17 0529  NA 137  K 3.7  CL 106  GLUCOSE 182*  BUN 20  CREATININE 1.74*  CALCIUM 7.8*   CBG (last 3)   Recent Labs  07/20/17 1654 07/20/17 2040 07/21/17 0618  GLUCAP 241* 141* 168*    Wt Readings from Last 3 Encounters:  07/21/17 74.4 kg (164 lb 0.3 oz)  07/16/17 74.8 kg (165 lb)  05/06/17 73 kg (161 lb)    Physical Exam:  BP (!) 149/72 (BP Location: Right Arm)   Pulse 92   Temp 98.7 F (37.1 C) (Oral)   Resp 16   Ht 5\' 11"  (1.803 m)   Wt 74.4 kg (164 lb 0.3 oz)   SpO2 99%   BMI 22.88 kg/m  Constitutional: He appears well-developed. No distress.  HENT: Normocephalicand atraumatic.  Eyes: EOMare normal. No discharge.  Cardiovascular: RRR. No JVD. Respiratory: Effort normaland breath sounds normal.  GI: Bowel sounds are normal. He exhibits no distension.  Neurological: He is alertand oriented.  Motor: B/L UE 5/5 prox to distal.  RLE: 4+/5 proximal to distal LLE: HF 4-/5 (pain inhibition) Skin: Left BKA site is dressed wound VAC in place  Psych: pleasant and appropriate   Assessment/Plan: 1. Functional deficits secondary to left BKA which require 3+ hours per day of interdisciplinary therapy in a comprehensive inpatient rehab setting. Physiatrist is providing close team supervision and 24 hour management of active medical problems listed below. Physiatrist and rehab team continue to assess barriers  to discharge/monitor patient progress toward functional and medical goals.  Function:  Bathing Bathing position      Bathing parts      Bathing assist        Upper Body Dressing/Undressing Upper body dressing                    Upper body assist        Lower Body Dressing/Undressing Lower body dressing                                  Lower body assist        Toileting Toileting          Toileting assist     Transfers Chair/bed transfer             Locomotion Ambulation           Wheelchair          Cognition Comprehension    Expression    Social Interaction    Problem Solving    Memory      Medical Problem List and Plan: 1. Decreased functional mobilitysecondary to left BKA 07/16/2017  Begin CIR 2. DVT Prophylaxis/Anticoagulation: SCD 3. Pain Management: Robaxin and oxycodone as needed 4. Mood: Provide emotional support 5. Neuropsych:  This patient iscapable of making decisions on hisown behalf. 6. Skin/Wound Care: Routine skin checks 7. Fluids/Electrolytes/Nutrition: Routine I&O 8.Acute on Chronic anemia. Patient receives Aranesp every 30 days.   Hb 8.4 on 10/24  Cont to monitor 9.Diabetes mellitus and peripheral neuropathy. Hemoglobin A1c 7.5. Glucotrol 10 mg daily, Lantus insulin 15 units daily at bedtime. Check blood sugars before meals and at bedtime. Diabetic teaching  Monitor with increased mobility 10.CKD stage III.   Cr. 1.74 on 10/24   Cont to monitor 11.Hypertension. Coreg 25 mg twice a day. Monitor with increased mobility  Monitor with increased mobility 12.Hyperlipidemia. Lipitor 13. Hypoalbuminemia  Supplement initiated 10/24  LOS (Days) 1 A FACE TO FACE EVALUATION WAS PERFORMED  Keith Guzman [name] 07/21/2017 8:07 AM

## 2017-07-21 NOTE — Patient Care Conference (Signed)
Inpatient RehabilitationTeam Conference and Plan of Care Update Date: 07/21/2017   Time: 2:25 PM    Patient Name: Keith Guzman      Medical Record Number: [id number]  Date of Birth: 03-03-1961 Sex: Male         Room/Bed: 4M11C/4M11C-01 Payor Info: Payor: MEDICARE / Plan: MEDICARE PART A AND B / Product Type: *No Product type* /    Admitting Diagnosis: Osteomyelitis foot  Admit Date/Time:  07/20/2017  4:21 PM Admission Comments: No comment available   Primary Diagnosis:  <principal problem not specified> Principal Problem: <principal problem not specified>  Patient Active Problem List   Diagnosis Date Noted  . Unilateral complete BKA, left, sequela (HCC)   . Type 2 diabetes mellitus with peripheral neuropathy (HCC)   . Hypoalbuminemia due to protein-calorie malnutrition (HCC)   . Amputation of left lower extremity below knee (HCC) 07/20/2017  . S/P unilateral BKA (below knee amputation), left (HCC)   . Chronic combined systolic and diastolic congestive heart failure (HCC)   . Stage 3 chronic kidney disease (HCC)   . Diabetes mellitus type 2 in nonobese (HCC)   . Benign essential HTN   . Acute blood loss anemia   . Post-operative pain   . S/P below knee amputation, left (HCC) 07/16/2017  . Subacute osteomyelitis, left ankle and foot (HCC) 07/13/2017  . S/P transmetatarsal amputation of foot, left (HCC) 09/03/2016  . Anemia of chronic disease 10/17/2014  . Congestive dilated cardiomyopathy (HCC) 12/11/2013  . Hyperlipidemia 12/11/2013  . Bruit 12/11/2013  . Chronic systolic heart failure (HCC) 11/13/2013  . Bilateral pleural effusion 10/16/2013  . Unspecified constipation 01/02/2013  . Constipation 12/11/2012  . Sepsis, gangrene Lt great toe 12/07/2012  . Skin ulcer of left lower leg (HCC) 12/07/2012  . Osteomyelitis of ankle, left foot and ankle- MRI 12/07/12 12/07/2012  . PVD, LSFA PTA 11/29/12 11/30/2012  . Non-healing ulcer of foot, left, limited to breakdown of skin (HCC)  11/30/2012  . HTN (hypertension), poor control 11/30/2012  . History of smoking. quit 2005 11/30/2012  . Sinus tachycardia 11/30/2012  . Uncontrolled type 2 diabetes mellitus with complication (HCC) 11/25/2006  . ANEMIA, microcytic- Hgb down to 6.7 12/07/12- transfused 11/25/2006    Expected Discharge Date: Expected Discharge Date: 07/31/17  Team Members Present: Physician leading conference: Dr. Maryla MorrowAnkit Patel Social Worker Present: Dossie DerBecky Grecia Lynk, LCSW Nurse Present: Chrissie NoaMelanie Barnes, RN PT Present: Woodfin GanjaEmily Van Shagen, PT OT Present: Perrin MalteseJames McGuire, OT SLP Present: Jackalyn LombardNicole Page, SLP PPS Coordinator present : Tora DuckMarie Noel, RN, CRRN     Current Status/Progress Goal Weekly Team Focus  Medical   Decreased functional mobility secondary to left BKA 07/16/2017  Improve mobility, safety, transfers  See above   Bowel/Bladder        cont B & B     Swallow/Nutrition/ Hydration             ADL's   Supervision for all UB selfcare and min assist for LB selfcare.  Min assist for stand pivot transfers  supervision to modified independent   selfcare retraining, transfer training, balance retraining, UE exercise, pt/family education, DME education   Mobility   min assist for transfers and gait up to 15 ft, supervision w/c mobility  supervision gait, Mod I w/c mobility and transfers  activity tolerance, standing balance, LE strengthening/ROM, gait   Communication             Safety/Cognition/ Behavioral Observations  Pain        MD managing pain from amputation     Skin        Wound vac-monitor skin        *See Care Plan and progress notes for long and short-term goals.     Barriers to Discharge  Current Status/Progress Possible Resolutions Date Resolved   Physician    Decreased caregiver support;Medical stability;Wound Care     See above  Therapies, follow labs, otpimize DM/HTN meds      Nursing                  PT  Decreased caregiver support;Home environment access/layout                  OT Decreased caregiver support  Spouse and son both work             SLP                SW Decreased caregiver support Does not have 24 hr care-wife works             Discharge Planning/Teaching Needs:    Home with wife who work nights and sleeps days. Son works two jobs. Pt will need to be mod/i to return home.     Team Discussion:  Goals supervision-mod/i wheelchair level. New eval today. MD monitoring diabetes and CKD adjusting meds. Poor endurance and fatigues easily. Wound Vac for seven days post op.  Revisions to Treatment Plan:  New eval target DC 11/3    Continued Need for Acute Rehabilitation Level of Care: The patient requires daily medical management by a physician with specialized training in physical medicine and rehabilitation for the following conditions: Daily direction of a multidisciplinary physical rehabilitation program to ensure safe treatment while eliciting the highest outcome that is of practical value to the patient.: Yes Daily medical management of patient stability for increased activity during participation in an intensive rehabilitation regime.: Yes Daily analysis of laboratory values and/or radiology reports with any subsequent need for medication adjustment of medical intervention for : Post surgical problems;Wound care problems;Diabetes problems;Blood pressure problems;Renal problems  Lucy Chris 07/22/2017, 8:17 AM
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
## 2017-07-22 ENCOUNTER — Inpatient Hospital Stay (HOSPITAL_COMMUNITY): Payer: 59 | Admitting: Occupational Therapy

## 2017-07-22 ENCOUNTER — Inpatient Hospital Stay (HOSPITAL_COMMUNITY): Payer: 59 | Admitting: Physical Therapy

## 2017-07-22 LAB — GLUCOSE, CAPILLARY
GLUCOSE-CAPILLARY: 82 mg/dL (ref 65–99)
GLUCOSE-CAPILLARY: 122 mg/dL — AB (ref 65–99)
Glucose-Capillary: 153 mg/dL — ABNORMAL HIGH (ref 65–99)
Glucose-Capillary: 89 mg/dL (ref 65–99)

## 2017-07-22 MED ORDER — AMLODIPINE BESYLATE 5 MG PO TABS
5.0000 mg | ORAL_TABLET | Freq: Every day | ORAL | Status: DC
  Administered 2017-07-22 – 2017-07-26 (×5): 5 mg via ORAL
  Filled 2017-07-22 (×5): qty 1

## 2017-07-22 NOTE — Progress Notes (Signed)
Social Work Patient ID: Keith Guzman, male   DOB: 23-Aug-1961, 56 y.o.   MRN: [id number]  Met with pt to inform of team conference goals mod/i wheelchair level and supervision with ambulation. Target discharge date 11/3. He feels this will be enough time for him to reach his goals and be safe at home alone. Will discuss again next Wed and work on discharge needs. Pt had a good day in therapies yesterday but was very tired.

## 2017-07-22 NOTE — Progress Notes (Signed)
Pt resting in bed quietly. Easily aroused. Denies any pain at this time. Able to make needs known. Uneventful night this shift. Safety maintained. callbell within reach. Will continue to monitor.

## 2017-07-22 NOTE — Progress Notes (Signed)
Occupational Therapy Session Note  Patient Details  Name: Keith Guzman MRN: [id number] Date of Birth: July 06, 1961  Today's Date: 07/22/2017 OT Individual Time: 3979-5369 OT Individual Time Calculation (min): 30 min    Short Term Goals: Week 1:  OT Short Term Goal 1 (Week 1): STGs equal to LTGs set at modified independent to supervision.    Skilled Therapeutic Interventions/Progress Updates:    Pt seated EOB upon OT arrival finishing breakfast.  Pt agreeable to participating in dressing and grooming tasks.  Pt performed squat pivot transfer to w/c with CGA and propelled self to sink with assistance to manage wound vac line.  Pt completed grooming routine from w/c level.  Pt doffed pants while seated, leaning laterally with good safety awareness and sitting balance.  Pt threaded jeans over BLEs with OT demonstrating how to managing wound vac line, and stood with CGA and RW to don over hips.  Cues required for hand placement and walker safety with Min A to steady due to minor LOB when donning pants over hips.  Pt fastened pants while seated.  Pt left seated in w/c with all needs in reach and met upon OT departure.  Therapy Documentation Precautions:  Precautions Precautions: Fall Precaution Comments: Wound vac Restrictions Weight Bearing Restrictions: Yes LLE Weight Bearing: Non weight bearing Other Position/Activity Restrictions: L BKA Pain: Pain Assessment Pain Assessment: 0-10 Pain Score: 5  Pain Type: Surgical pain Pain Location: Incision Pain Orientation: Left Pain Descriptors / Indicators: Aching Pain Frequency: Intermittent Pain Onset: With Activity Pain Intervention(s): Repositioned  See Function Navigator for Current Functional Status.   Therapy/Group: Individual Therapy  Marcella Dubs 07/22/2017, 12:00 PM

## 2017-07-22 NOTE — Progress Notes (Signed)
Social Work Spencer Cardinal, Elveria Rising Social Worker Signed Physical Medicine and Rehabilitation  Patient Care Conference Date of Service: 07/21/2017  2:54 PM      Hide copied text Hover for attribution information Inpatient RehabilitationTeam Conference and Plan of Care Update Date: 07/21/2017   Time: 2:25 PM      Patient Name: DELMER ROSSEY      Medical Record Number: [id number]  Date of Birth: 06/22/1961 Sex: Male         Room/Bed: 4M11C/4M11C-01 Payor Info: Payor: MEDICARE / Plan: MEDICARE PART A AND B / Product Type: *No Product type* /     Admitting Diagnosis: Osteomyelitis foot  Admit Date/Time:  07/20/2017  4:21 PM Admission Comments: No comment available    Primary Diagnosis:  <principal problem not specified> Principal Problem: <principal problem not specified>       Patient Active Problem List    Diagnosis Date Noted  . Unilateral complete BKA, left, sequela (HCC)    . Type 2 diabetes mellitus with peripheral neuropathy (HCC)    . Hypoalbuminemia due to protein-calorie malnutrition (HCC)    . Amputation of left lower extremity below knee (HCC) 07/20/2017  . S/P unilateral BKA (below knee amputation), left (HCC)    . Chronic combined systolic and diastolic congestive heart failure (HCC)    . Stage 3 chronic kidney disease (HCC)    . Diabetes mellitus type 2 in nonobese (HCC)    . Benign essential HTN    . Acute blood loss anemia    . Post-operative pain    . S/P below knee amputation, left (HCC) 07/16/2017  . Subacute osteomyelitis, left ankle and foot (HCC) 07/13/2017  . S/P transmetatarsal amputation of foot, left (HCC) 09/03/2016  . Anemia of chronic disease 10/17/2014  . Congestive dilated cardiomyopathy (HCC) 12/11/2013  . Hyperlipidemia 12/11/2013  . Bruit 12/11/2013  . Chronic systolic heart failure (HCC) 11/13/2013  . Bilateral pleural effusion 10/16/2013  . Unspecified constipation 01/02/2013  . Constipation 12/11/2012  . Sepsis, gangrene Lt great toe  12/07/2012  . Skin ulcer of left lower leg (HCC) 12/07/2012  . Osteomyelitis of ankle, left foot and ankle- MRI 12/07/12 12/07/2012  . PVD, LSFA PTA 11/29/12 11/30/2012  . Non-healing ulcer of foot, left, limited to breakdown of skin (HCC) 11/30/2012  . HTN (hypertension), poor control 11/30/2012  . History of smoking. quit 2005 11/30/2012  . Sinus tachycardia 11/30/2012  . Uncontrolled type 2 diabetes mellitus with complication (HCC) 11/25/2006  . ANEMIA, microcytic- Hgb down to 6.7 12/07/12- transfused 11/25/2006      Expected Discharge Date: Expected Discharge Date: 07/31/17   Team Members Present: Physician leading conference: Dr. Maryla Morrow Social Worker Present: Dossie Der, LCSW Nurse Present: Chrissie Noa, RN PT Present: Woodfin Ganja, PT OT Present: Perrin Maltese, OT SLP Present: Jackalyn Lombard, SLP PPS Coordinator present : Tora Duck, RN, CRRN       Current Status/Progress Goal Weekly Team Focus  Medical     Decreased functional mobility secondary to left BKA 07/16/2017  Improve mobility, safety, transfers  See above   Bowel/Bladder          cont B & B     Swallow/Nutrition/ Hydration               ADL's     Supervision for all UB selfcare and min assist for LB selfcare.  Min assist for stand pivot transfers  supervision to modified independent   selfcare retraining, transfer training, balance retraining,  UE exercise, pt/family education, DME education   Mobility     min assist for transfers and gait up to 15 ft, supervision w/c mobility  supervision gait, Mod I w/c mobility and transfers  activity tolerance, standing balance, LE strengthening/ROM, gait   Communication               Safety/Cognition/ Behavioral Observations             Pain          MD managing pain from amputation     Skin          Wound vac-monitor skin       *See Care Plan and progress notes for long and short-term goals.      Barriers to Discharge   Current Status/Progress Possible  Resolutions Date Resolved   Physician     Decreased caregiver support;Medical stability;Wound Care     See above  Therapies, follow labs, otpimize DM/HTN meds      Nursing                 PT  Decreased caregiver support;Home environment access/layout                 OT Decreased caregiver support  Spouse and son both work             SLP            SW Decreased caregiver support Does not have 24 hr care-wife works              Discharge Planning/Teaching Needs:    Home with wife who work nights and sleeps days. Son works two jobs. Pt will need to be mod/i to return home.     Team Discussion:  Goals supervision-mod/i wheelchair level. New eval today. MD monitoring diabetes and CKD adjusting meds. Poor endurance and fatigues easily. Wound Vac for seven days post op.  Revisions to Treatment Plan:  New eval target DC 11/3    Continued Need for Acute Rehabilitation Level of Care: The patient requires daily medical management by a physician with specialized training in physical medicine and rehabilitation for the following conditions: Daily direction of a multidisciplinary physical rehabilitation program to ensure safe treatment while eliciting the highest outcome that is of practical value to the patient.: Yes Daily medical management of patient stability for increased activity during participation in an intensive rehabilitation regime.: Yes Daily analysis of laboratory values and/or radiology reports with any subsequent need for medication adjustment of medical intervention for : Post surgical problems;Wound care problems;Diabetes problems;Blood pressure problems;Renal problems   Lucy ChrisDupree, Rai Sinagra G 07/22/2017, 8:17 AM           Patient ID: Loyola MastLouis M Donoghue, male   DOB: 08/17/1961, 56 y.o.   MRN: [id number]

## 2017-07-22 NOTE — Progress Notes (Signed)
[facility] PHYSICAL MEDICINE & REHABILITATION     PROGRESS NOTE  Subjective/Complaints:  Patient seen lying bed this morning. He states he slept well but is still sleepy this morning.  ROS: Denies CP, SOB, N/V/D.  Objective: Vital Signs: Blood pressure (!) 167/84, pulse 86, temperature 98.8 F (37.1 C), temperature source Oral, resp. rate 18, height 5\' 11"  (1.803 m), weight 74.8 kg (165 lb), SpO2 100 %. No results found.  Recent Labs  07/21/17 0529  WBC 9.0  HGB 8.4*  HCT 28.5*  PLT 356    Recent Labs  07/21/17 0529  NA 137  K 3.7  CL 106  GLUCOSE 182*  BUN 20  CREATININE 1.74*  CALCIUM 7.8*   CBG (last 3)   Recent Labs  07/21/17 2040 07/22/17 0655 07/22/17 1202  GLUCAP 183* 153* 82    Wt Readings from Last 3 Encounters:  07/22/17 74.8 kg (165 lb)  07/16/17 74.8 kg (165 lb)  05/06/17 73 kg (161 lb)    Physical Exam:  BP (!) 167/84 (BP Location: Right Arm)   Pulse 86   Temp 98.8 F (37.1 C) (Oral)   Resp 18   Ht 5\' 11"  (1.803 m)   Wt 74.8 kg (165 lb)   SpO2 100%   BMI 23.01 kg/m  Constitutional: He appears well-developed. No distress.  HENT: Normocephalicand atraumatic.  Eyes: EOMare normal. No discharge.  Cardiovascular: RRR. No JVD. Respiratory: Effort Normal and breath sounds normal.  GI: Bowel sounds are normal. He exhibits no distension.  Neurological: He is alertand oriented.  Motor: B/L UE 5/5 prox to distal.  RLE: 4+/5 proximal to distal LLE: HF 4+/5  Skin: Left BKA site is dressed wound VAC in place  Psych: pleasant and appropriate   Assessment/Plan: 1. Functional deficits secondary to left BKA which require 3+ hours per day of interdisciplinary therapy in a comprehensive inpatient rehab setting. Physiatrist is providing close team supervision and 24 hour management of active medical problems listed below. Physiatrist and rehab team continue to assess barriers to discharge/monitor patient progress toward functional and medical  goals.  Function:  Bathing Bathing position   Position: Sitting EOB  Bathing parts Body parts bathed by patient: Right arm, Left arm, Chest, Abdomen, Front perineal area, Buttocks, Right upper leg, Left upper leg, Right lower leg    Bathing assist Assist Level: Touching or steadying assistance(Pt > 75%)      Upper Body Dressing/Undressing Upper body dressing   What is the patient wearing?: Pull over shirt/dress     Pull over shirt/dress - Perfomed by patient: Thread/unthread right sleeve, Thread/unthread left sleeve, Put head through opening, Pull shirt over trunk          Upper body assist        Lower Body Dressing/Undressing Lower body dressing   What is the patient wearing?: Pants, Non-skid slipper socks     Pants- Performed by patient: Thread/unthread right pants leg, Thread/unthread left pants leg, Pull pants up/down, Fasten/unfasten pants   Non-skid slipper socks- Performed by patient: Don/doff right sock                    Lower body assist Assist for lower body dressing: Touching or steadying assistance (Pt > 75%)      Toileting Toileting   Toileting steps completed by patient: Adjust clothing prior to toileting, Performs perineal hygiene, Adjust clothing after toileting   Toileting Assistive Devices: Grab bar or rail  Toileting assist Assist level: Touching or  steadying assistance (Pt.75%)   Transfers Chair/bed transfer   Chair/bed transfer method: Stand pivot Chair/bed transfer assist level: Touching or steadying assistance (Pt > 75%) Chair/bed transfer assistive device: Armrests, Patent attorney     Max distance: 15 ft Assist level: Touching or steadying assistance (Pt > 75%)   Wheelchair   Type: Manual Max wheelchair distance: 150 ft Assist Level: Supervision or verbal cues  Cognition Comprehension Comprehension assist level: Follows basic conversation/direction with no assist  Expression Expression assist level:  Expresses basic needs/ideas: With no assist  Social Interaction Social Interaction assist level: Interacts appropriately with others with medication or extra time (anti-anxiety, antidepressant).  Problem Solving Problem solving assist level: Solves basic problems with no assist  Memory Memory assist level: Recognizes or recalls 75 - 89% of the time/requires cueing 10 - 24% of the time    Medical Problem List and Plan: 1. Decreased functional mobilitysecondary to left BKA 07/16/2017  Continue CIR 2. DVT Prophylaxis/Anticoagulation: SCD 3. Pain Management: Robaxin and oxycodone as needed 4. Mood: Provide emotional support 5. Neuropsych: This patient iscapable of making decisions on hisown behalf. 6. Skin/Wound Care: Routine skin checks  Will DC VAC on 10/27 7. Fluids/Electrolytes/Nutrition: Routine I&O 8.Acute on Chronic anemia. Patient receives Aranesp every 30 days.   Hb 8.4 on 10/24  Cont to monitor 9.Diabetes mellitus and peripheral neuropathy. Hemoglobin A1c 7.5. Glucotrol 10 mg daily, Lantus insulin 15 units daily at bedtime. Check blood sugars before meals and at bedtime. Diabetic teaching  Appears to be improving 10.CKD stage III.   Cr. 1.74 on 10/24   Cont to monitor 11.Hypertension.   Coreg 25 mg twice a day.  Norvasc 5 started on 10/25  Monitor with increased mobility 12.Hyperlipidemia. Lipitor 13. Hypoalbuminemia  Supplement initiated 10/24  LOS (Days) 2 A FACE TO FACE EVALUATION WAS PERFORMED  Magdalena Skilton [name] 07/22/2017 12:34 PM

## 2017-07-22 NOTE — Progress Notes (Signed)
Occupational Therapy Session Note  Patient Details  Name: Keith Guzman MRN: [id number] Date of Birth: [birth year]  Today's Date: 07/22/2017 OT Individual Time: 1002-1103 OT Individual Time Calculation (min): 61 min    Short Term Goals: Week 1:  OT Short Term Goal 1 (Week 1): STGs equal to LTGs set at modified independent to supervision.    Skilled Therapeutic Interventions/Progress Updates:    Session 1:  ([id number]) Pt completed washing peri area at the sink with min assist sit to stand.  Pt reported washing all other areas and donning clothing in previous session earlier in the day.  Once completed, he rolled himself down to the ADL apartment where he practiced tub shower transfers with use of the tub bench.  He was able to complete tub/shower transfer with min assist using the RW for support.  Mod instructional cueing for hand placement with sit to stand from the wheelchair and tub bench as well as for transitioning to sitting.  Pt at times sits down with decreased controlled descent.  Next had pt work on BUE strengthening exercises using medium resistance orange therapy band.  He completed 2 sets of 10 reps for shoulder horizontal abduction and 1 set of 20 reps for triceps extension, while sitting in the wheelchair.  Finished session with return to room via wheelchair, with pt pushing back with supervision.  Pt left in wheelchair with call button and phone in reach.    Session 2: ([id number]) Pt completed wheelchair mobility down to the orthopedic gym for session with supervision.  Once in the gym had pt work on stand pivot transfers with the RW and short distance mobility to the mats and to the wheelchair.  Mod instructional cueing for hand placement during stand to sit transition.  Incorporated tossing horseshoes in standing with both the left and right UEs, while supporting on the RW with the other hand.  Completed 4 intervals and also attempted to ambulate over and pick up the horseshoes from  the floor while standing, to work on dynamic balance.  Max assist for standing balance when attempting reach down to the floor and return to upright standing.  Finished session with return back to the room with pt left sitting in wheelchair for next session.    Therapy Documentation Precautions:  Precautions Precautions: Fall Precaution Comments: Wound vac Restrictions Weight Bearing Restrictions: Yes LLE Weight Bearing: Non weight bearing Other Position/Activity Restrictions: L BKA  Pain: Pain Assessment Pain Assessment: 0-10 Pain Score: 5  Pain Type: Surgical pain Pain Location: Incision Pain Orientation: Left Pain Descriptors / Indicators: Aching Pain Onset: With Activity Pain Intervention(s): Repositioned ADL: See Function Navigator for Current Functional Status.   Therapy/Group: Individual Therapy  Pilar Westergaard OTR/L 07/22/2017, 3:49 PM

## 2017-07-22 NOTE — Progress Notes (Signed)
Physical Therapy Session Note  Patient Details  Name: Keith Guzman MRN: [id number] Date of Birth: 1961/06/19  Today's Date: 07/22/2017 PT Individual Time: 1545-1650 PT Individual Time Calculation (min): 65 min   Short Term Goals: Week 1:  PT Short Term Goal 1 (Week 1): STG=LTG due to ELOS  Skilled Therapeutic Interventions/Progress Updates:   Pt in w/c upon arrival and agreeable to therapy, no c/o pain. Pt self-propelled w/c to/from gym w/ supervision using BUEs to work on functional endurance. Ambulated 25' in gym using RW w/ Min guard and performed LE strengthening exercises as listed below. Multiple seated rest breaks to help w/ pain in residual limb during mobility and 2/2 muscular fatigue w/ exercises. Educated pt on residual limb care during rest breaks including desentization techniques. Returned to room in w/c and assisted pt w/ going to/from toilet w/ Min A and then to EOB w/ min guard. Ended session EOB, call bell within reach and all needs met.   LE strengthening exercises: -standing R heel raises, 2x10  -standing R partial knee bends  -5x sit<>stands x1 -seated L LAQs within pain tolerance, 3x10  -static L hs stretch during seated rest breaks   Therapy Documentation Precautions:  Precautions Precautions: Fall Precaution Comments: Wound vac Restrictions Weight Bearing Restrictions: Yes LLE Weight Bearing: Non weight bearing Other Position/Activity Restrictions: L BKA Vital Signs:   See Function Navigator for Current Functional Status.   Therapy/Group: Individual Therapy  Shenay Torti K Arnette 07/22/2017, 5:12 PM

## 2017-07-23 ENCOUNTER — Inpatient Hospital Stay (HOSPITAL_COMMUNITY): Payer: 59 | Admitting: Occupational Therapy

## 2017-07-23 ENCOUNTER — Inpatient Hospital Stay (HOSPITAL_COMMUNITY): Payer: 59 | Admitting: Physical Therapy

## 2017-07-23 DIAGNOSIS — E11649 Type 2 diabetes mellitus with hypoglycemia without coma: Secondary | ICD-10-CM

## 2017-07-23 LAB — GLUCOSE, CAPILLARY
GLUCOSE-CAPILLARY: 58 mg/dL — AB (ref 65–99)
GLUCOSE-CAPILLARY: 187 mg/dL — AB (ref 65–99)
GLUCOSE-CAPILLARY: 163 mg/dL — AB (ref 65–99)
Glucose-Capillary: 116 mg/dL — ABNORMAL HIGH (ref 65–99)
Glucose-Capillary: 190 mg/dL — ABNORMAL HIGH (ref 65–99)

## 2017-07-23 MED ORDER — INSULIN GLARGINE 100 UNIT/ML ~~LOC~~ SOLN
10.0000 [IU] | Freq: Every day | SUBCUTANEOUS | Status: DC
  Administered 2017-07-23 – 2017-07-30 (×8): 10 [IU] via SUBCUTANEOUS
  Filled 2017-07-23 (×8): qty 0.1

## 2017-07-23 NOTE — Progress Notes (Signed)
Occupational Therapy Session Note  Patient Details  Name: Keith Guzman MRN: [id number] Date of Birth: July 15, 1961  Today's Date: 07/23/2017 OT Individual Time: 1001-1102 OT Individual Time Calculation (min): 61 min    Short Term Goals: Week 1:  OT Short Term Goal 1 (Week 1): STGs equal to LTGs set at modified independent to supervision.    Skilled Therapeutic Interventions/Progress Updates:    Pt completed bathing and dressing at the sink from the wheelchair to start the session.  He was able to complete with overall min guard assist sit to stand.  Min instructional cueing for sequencing sit to stand, including scooting the the edge and then pushing up from the wheelchair with both hands, and not attempting to pull up on the sink.  He was able to donn all clothing with min guard assist as well.  Grooming tasks of washing his face and oral hygiene completed at setup level from wheelchair as well.  Next took pt to the dayroom via wheelchair where he completed 2 sets of 4 mins on the UE ergonometer for BUE strengthening and endurance.  He was able to complete both sets with resistance set at level 8 and RPMs maintained at 24.  Finished session with return to nurses station in wheelchair secondary to heating being worked on in his room.    Therapy Documentation Precautions:  Precautions Precautions: Fall Precaution Comments: Wound vac Restrictions Weight Bearing Restrictions: Yes LLE Weight Bearing: Non weight bearing Other Position/Activity Restrictions: L BKA  Pain: Pain Assessment Pain Assessment: No/denies pain ADL: See Function Navigator for Current Functional Status.   Therapy/Group: Individual Therapy  Jaqueline Uber OTR/L 07/23/2017, 12:16 PM

## 2017-07-23 NOTE — Progress Notes (Signed)
Physical Therapy Session Note  Patient Details  Name: Keith Guzman MRN: [id number] Date of Birth: Feb 13, 1961  Today's Date: 07/23/2017 PT Individual Time: 0915-1000 PT Individual Time Calculation (min): 45 min  and Today's Date: 07/23/2017 PT Missed Time: 15 Minutes AND 30 min Missed Time Reason: Patient unwilling to participate (pt eating breakfast, rufusing PT until he was finished) AND Patient unwilling to participate; pain  Short Term Goals: Week 1:  PT Short Term Goal 1 (Week 1): STG=LTG due to ELOS  Skilled Therapeutic Interventions/Progress Updates:   Session 1:  Pt sitting EOB upon arrival and agreeable to therapy, but refusing to participate until pt finished w/ breakfast. No c/o pain. Worked on functional mobility this session. Pt ambulated 50' w/ close supervision using RW and w/c follow. Pt then self-propelled the w/c to the gym w/ supervision using BUEs and increased time, >150'. Worked on steps in gym in parallel bars, 3" step x2. Performed 1 3" step using RW outside of parallel bars w/ Mod A overall for balance and RW management. Pt w/ increased LLE hamstring tightness this session, adjusted amputee pad on w/c to facilitate increased L hamstring lengthening. Returned to room Total A in w/c and ended session in w/c, call bell within reach and all needs met.   Session 2:  Pt supine and refusing to participate in therapy because of pain. PT offered to return in 1 hour to check on pain level, pt stating he will be asleep and not to wake him up. Continued to offer to return in 1 hour to check in, pt declined.  Therapy Documentation Precautions:  Precautions Precautions: Fall Precaution Comments: Wound vac Restrictions Weight Bearing Restrictions: Yes LLE Weight Bearing: Non weight bearing Other Position/Activity Restrictions: L BKA General: PT Amount of Missed Time (min): 15 Minutes PT Missed Treatment Reason: Patient unwilling to participate (pt eating breakfast, rufusing  PT until he was finished)  See Function Navigator for Current Functional Status.   Therapy/Group: Individual Therapy  Elizabeth Paulsen K Arnette 07/23/2017, 12:31 PM

## 2017-07-23 NOTE — Progress Notes (Signed)
[facility] PHYSICAL MEDICINE & REHABILITATION     PROGRESS NOTE  Subjective/Complaints:  Patient seen sitting up at the edge of his bed this morning.  He states he slept fairly.  He is upset that people keep coming into his room for different things.  He request to be moved to room awake and locked the door.  ROS: Denies CP, SOB, N/V/D.  Objective: Vital Signs: Blood pressure (!) 169/88, pulse 84, temperature 98.4 F (36.9 C), temperature source Oral, resp. rate 17, height 5\' 11"  (1.803 m), weight 74.8 kg (165 lb), SpO2 99 %. No results found.  Recent Labs  07/21/17 0529  WBC 9.0  HGB 8.4*  HCT 28.5*  PLT 356    Recent Labs  07/21/17 0529  NA 137  K 3.7  CL 106  GLUCOSE 182*  BUN 20  CREATININE 1.74*  CALCIUM 7.8*   CBG (last 3)   Recent Labs  07/22/17 2145 07/23/17 0700 07/23/17 0741  GLUCAP 89 58* 116*    Wt Readings from Last 3 Encounters:  07/22/17 74.8 kg (165 lb)  07/16/17 74.8 kg (165 lb)  05/06/17 73 kg (161 lb)    Physical Exam:  BP (!) 169/88 (BP Location: Left Arm)   Pulse 84   Temp 98.4 F (36.9 C) (Oral)   Resp 17   Ht 5\' 11"  (1.803 m)   Wt 74.8 kg (165 lb)   SpO2 99%   BMI 23.01 kg/m  Constitutional: He appears well-developed. No distress.  HENT: Normocephalicand atraumatic.  Eyes: EOMare normal. No discharge.  Cardiovascular: RRR. No JVD. Respiratory: Effort normal and breath sounds normal.  GI: Bowel sounds are normal. He exhibits no distension.  Neurological: He is alertand oriented.  Motor: B/L UE 5/5 prox to distal.  RLE: 4+/5 proximal to distal LLE: HF 4+/5  Skin: +VAC in place  Psych: Easily agitated  Assessment/Plan: 1. Functional deficits secondary to left BKA which require 3+ hours per day of interdisciplinary therapy in a comprehensive inpatient rehab setting. Physiatrist is providing close team supervision and 24 hour management of active medical problems listed below. Physiatrist and rehab team continue to  assess barriers to discharge/monitor patient progress toward functional and medical goals.  Function:  Bathing Bathing position   Position: Wheelchair/chair at sink  Bathing parts Body parts bathed by patient: Front perineal area, Buttocks    Bathing assist Assist Level: Touching or steadying assistance(Pt > 75%)      Upper Body Dressing/Undressing Upper body dressing   What is the patient wearing?: Pull over shirt/dress     Pull over shirt/dress - Perfomed by patient: Thread/unthread right sleeve, Thread/unthread left sleeve, Put head through opening, Pull shirt over trunk          Upper body assist        Lower Body Dressing/Undressing Lower body dressing   What is the patient wearing?: Pants, Non-skid slipper socks     Pants- Performed by patient: Thread/unthread right pants leg, Thread/unthread left pants leg, Pull pants up/down, Fasten/unfasten pants   Non-skid slipper socks- Performed by patient: Don/doff right sock                    Lower body assist Assist for lower body dressing: Touching or steadying assistance (Pt > 75%)      Toileting Toileting   Toileting steps completed by patient: Adjust clothing prior to toileting, Performs perineal hygiene, Adjust clothing after toileting   Toileting Assistive Devices: Grab bar or rail  Toileting assist Assist level: Touching or steadying assistance (Pt.75%)   Transfers Chair/bed transfer   Chair/bed transfer method: Stand pivot Chair/bed transfer assist level: Touching or steadying assistance (Pt > 75%) Chair/bed transfer assistive device: Armrests, Walker     Locomotion Ambulation     Max distance: 25' Assist level: Touching or steadying assistance (Pt > 75%)   Wheelchair   Type: Manual Max wheelchair distance: >150' Assist Level: Supervision or verbal cues  Cognition Comprehension Comprehension assist level: Follows basic conversation/direction with no assist  Expression Expression assist  level: Expresses basic needs/ideas: With no assist  Social Interaction Social Interaction assist level: Interacts appropriately with others with medication or extra time (anti-anxiety, antidepressant).  Problem Solving Problem solving assist level: Solves basic problems with no assist  Memory Memory assist level: Recognizes or recalls 75 - 89% of the time/requires cueing 10 - 24% of the time    Medical Problem List and Plan: 1. Decreased functional mobilitysecondary to left BKA 07/16/2017  Continue CIR 2. DVT Prophylaxis/Anticoagulation: SCD 3. Pain Management: Robaxin and oxycodone as needed 4. Mood: Provide emotional support 5. Neuropsych: This patient iscapable of making decisions on hisown behalf. 6. Skin/Wound Care: Routine skin checks  Plan to DC VAC on 10/27 7. Fluids/Electrolytes/Nutrition: Routine I&O 8.Acute on Chronic anemia. Patient receives Aranesp every 30 days.   Hb 8.4 on 10/24  Labs for Monday  Cont to monitor 9.Diabetes mellitus and peripheral neuropathy. Hemoglobin A1c 7.5. Glucotrol 10 mg daily, NovoLog 3 units TID with meals.   Check blood sugars before meals and at bedtime. Diabetic teaching  Lantus insulin 15 units daily at bedtime, decreased to 10 units on 10/26  Hypoglycemia this morning 10.CKD stage III.   Cr. 1.74 on 10/24   Labs for Monday  Cont to monitor 11.Hypertension.   Coreg 25 mg twice a day.  Norvasc 5 started on 10/25  Remains elevated, will consider further adjustments  Monitor with increased mobility 12.Hyperlipidemia. Lipitor 13. Hypoalbuminemia  Supplement initiated 10/24  LOS (Days) 3 A FACE TO FACE EVALUATION WAS PERFORMED  Pinchas Reither [name] 07/23/2017 8:33 AM

## 2017-07-23 NOTE — Progress Notes (Signed)
Hypoglycemic Event  CBG: 58  Treatment: 15 GM carbohydrate snack  Symptoms: "groggy"  Follow-up CBG: Time: 0741 CBG Result:116  Possible Reasons for Event: Medication regimen: insulin  Comments/MD notified: MD Allena Katz notified    Magnus Crescenzo O Jaylon Boylen

## 2017-07-23 NOTE — Progress Notes (Signed)
Occupational Therapy Session Note  Patient Details  Name: Keith Guzman MRN: [id number] Date of Birth: 03-Jan-1961  Today's Date: 07/23/2017 OT Individual Time: 0223-3612 OT Individual Time Calculation (min): 47 min    Short Term Goals: Week 1:  OT Short Term Goal 1 (Week 1): STGs equal to LTGs set at modified independent to supervision.    Skilled Therapeutic Interventions/Progress Updates:    Pt initially complaining of pain in the LLE and upset at events earlier where he could not return to his room secondary to them working on the heating.  He stated he was upset and his leg was hurting and was not doing therapy.  After therapist talked to him and helped position his leg, he finally agreed to try.  Completed transfers squat pivot to the wheelchair from the bed with min guard assist.  Once in the chair therapist took him down to the therapy gym where he worked on functional mobility using the RW.  Pt still reporting increased pain in the LLE in standing at a 7/10.  He was able to ambulate 36ft with min assist using the RW.  Min instructional cueing for technique to transition to sitting from standing, with reaching back with the RUE for the wheelchair arm first and then reaching with the left.  Worked on adjusting LLE leg rest to allow for knee extension.  He still demonstrates knee flexion in sitting and in bed.  Returned to the room with pt completing squat pivot transfer back to the bed with min guard assist.  Therapist locked out the lower part of the bed to prevent knee flexion and positioned folded towel under the distal end of the LLE.  Pt left with call button and phone in reach and bed alarm in place.  Instructed pt to call nursing if pain did not subside as he was given meds prior to session.    Therapy Documentation Precautions:  Precautions Precautions: Fall Precaution Comments: Wound vac Restrictions Weight Bearing Restrictions: Yes LLE Weight Bearing: Non weight bearing Other  Position/Activity Restrictions: L BKA  Pain: Pain Assessment Pain Assessment: 0-10 Pain Score: 7  Pain Type: Surgical pain Pain Location: Leg Pain Orientation: Left Pain Descriptors / Indicators: Discomfort;Grimacing Pain Frequency: Intermittent Pain Onset: On-going Pain Intervention(s): Repositioned;Emotional support ADL: See Function Navigator for Current Functional Status.   Therapy/Group: Individual Therapy  Reshanda Lewey OTR/L 07/23/2017, 4:10 PM

## 2017-07-24 DIAGNOSIS — E8809 Other disorders of plasma-protein metabolism, not elsewhere classified: Secondary | ICD-10-CM

## 2017-07-24 DIAGNOSIS — E785 Hyperlipidemia, unspecified: Secondary | ICD-10-CM

## 2017-07-24 DIAGNOSIS — D500 Iron deficiency anemia secondary to blood loss (chronic): Secondary | ICD-10-CM

## 2017-07-24 DIAGNOSIS — M79605 Pain in left leg: Secondary | ICD-10-CM

## 2017-07-24 DIAGNOSIS — I82402 Acute embolism and thrombosis of unspecified deep veins of left lower extremity: Secondary | ICD-10-CM

## 2017-07-24 DIAGNOSIS — Z7409 Other reduced mobility: Secondary | ICD-10-CM

## 2017-07-24 LAB — GLUCOSE, CAPILLARY
GLUCOSE-CAPILLARY: 141 mg/dL — AB (ref 65–99)
GLUCOSE-CAPILLARY: 159 mg/dL — AB (ref 65–99)
GLUCOSE-CAPILLARY: 88 mg/dL (ref 65–99)
Glucose-Capillary: 184 mg/dL — ABNORMAL HIGH (ref 65–99)

## 2017-07-24 NOTE — Progress Notes (Signed)
Wound vac removed from left BKA site without complications. Pt provided pain medications prior to removal. Pt tolerated well.  Scant bleeding noted along incisional line closed with staples.  Left lateral flap not approximated with incision however doesn't appear to be open.  Applied dry dressing and compression wrap per order

## 2017-07-24 NOTE — Progress Notes (Signed)
[place] PHYSICAL MEDICINE & REHABILITATION     PROGRESS NOTE  Subjective/Complaints:  No new complaints  ROS: pt denies nausea, vomiting, diarrhea, cough, shortness of breath or chest pain   Objective: Vital Signs: Blood pressure (!) 153/89, pulse 84, temperature 98.9 F (37.2 C), temperature source Oral, resp. rate 17, height 5\' 11"  (1.803 m), weight 74.8 kg (165 lb), SpO2 98 %. No results found. No results for input(s): WBC, HGB, HCT, PLT in the last 72 hours. No results for input(s): NA, K, CL, GLUCOSE, BUN, CREATININE, CALCIUM in the last 72 hours.  Invalid input(s): CO CBG (last 3)   Recent Labs  07/23/17 1717 07/23/17 2048 07/24/17 0630  GLUCAP 163* 190* 141*    Wt Readings from Last 3 Encounters:  07/22/17 74.8 kg (165 lb)  07/16/17 74.8 kg (165 lb)  05/06/17 73 kg (161 lb)    Physical Exam:  BP (!) 153/89 (BP Location: Right Arm)   Pulse 84   Temp 98.9 F (37.2 C) (Oral)   Resp 17   Ht 5\' 11"  (1.803 m)   Wt 74.8 kg (165 lb)   SpO2 98%   BMI 23.01 kg/m  Constitutional: He appears well-developed. No distress.  HENT: Normocephalicand atraumatic.  Eyes: EOMare normal. No discharge.  Cardiovascular: RRR without murmur. No JVD . Respiratory: Effort normal and breath sounds normal.  GI: Bowel sounds are normal. He exhibits no distension.  Neurological: He is alertand oriented.  Motor: B/L UE 5/5 prox to distal.  RLE: 4+/5 proximal to distal LLE: HF 4+/5  Skin: +VAC in place  Psych: Easily agitated  Assessment/Plan: 1. Functional deficits secondary to left BKA which require 3+ hours per day of interdisciplinary therapy in a comprehensive inpatient rehab setting. Physiatrist is providing close team supervision and 24 hour management of active medical problems listed below. Physiatrist and rehab team continue to assess barriers to discharge/monitor patient progress toward functional and medical goals.  Function:  Bathing Bathing position    Position: Wheelchair/chair at sink  Bathing parts Body parts bathed by patient: Right arm, Left arm, Chest, Abdomen, Front perineal area, Buttocks, Right upper leg, Left upper leg, Right lower leg    Bathing assist Assist Level: Touching or steadying assistance(Pt > 75%)      Upper Body Dressing/Undressing Upper body dressing   What is the patient wearing?: Pull over shirt/dress     Pull over shirt/dress - Perfomed by patient: Thread/unthread right sleeve, Thread/unthread left sleeve, Put head through opening, Pull shirt over trunk          Upper body assist Assist Level: Supervision or verbal cues      Lower Body Dressing/Undressing Lower body dressing   What is the patient wearing?: Pants     Pants- Performed by patient: Thread/unthread right pants leg, Thread/unthread left pants leg, Pull pants up/down, Fasten/unfasten pants   Non-skid slipper socks- Performed by patient: Don/doff right sock                    Lower body assist Assist for lower body dressing: Touching or steadying assistance (Pt > 75%)      Toileting Toileting   Toileting steps completed by patient: Adjust clothing prior to toileting, Performs perineal hygiene, Adjust clothing after toileting   Toileting Assistive Devices: Grab bar or rail  Toileting assist Assist level: Touching or steadying assistance (Pt.75%)   Transfers Chair/bed transfer   Chair/bed transfer method: Stand pivot Chair/bed transfer assist level: Touching or steadying assistance (Pt >  75%) Chair/bed transfer assistive device: Armrests, Patent attorney     Max distance: 50' Assist level: Supervision or verbal cues   Wheelchair   Type: Manual Max wheelchair distance: >150' Assist Level: Supervision or verbal cues  Cognition Comprehension Comprehension assist level: Follows basic conversation/direction with no assist  Expression Expression assist level: Expresses basic needs/ideas: With no assist   Social Interaction Social Interaction assist level: Interacts appropriately with others with medication or extra time (anti-anxiety, antidepressant).  Problem Solving Problem solving assist level: Solves basic problems with no assist  Memory Memory assist level: Recognizes or recalls 75 - 89% of the time/requires cueing 10 - 24% of the time    Medical Problem List and Plan: 1. Decreased functional mobilitysecondary to left BKA 07/16/2017  Continue CIR 2. DVT Prophylaxis/Anticoagulation: SCD 3. Pain Management: Robaxin and oxycodone as needed 4. Mood: Provide emotional support 5. Neuropsych: This patient iscapable of making decisions on hisown behalf. 6. Skin/Wound Care: Routine skin checks  Remove vac today 7. Fluids/Electrolytes/Nutrition: Routine I&O 8.Acute on Chronic anemia. Patient receives Aranesp every 30 days.   Hb 8.4 on 10/24  Labs for Monday  Cont to monitor 9.Diabetes mellitus and peripheral neuropathy. Hemoglobin A1c 7.5. Glucotrol 10 mg daily, NovoLog 3 units TID with meals.   Check blood sugars before meals and at bedtime. Diabetic teaching  Lantus insulin 15 units daily at bedtime, decreased to 10 units on 10/26  Hypoglycemia this morning 10.CKD stage III.   Cr. 1.74 on 10/24   Labs for Monday  Cont to monitor 11.Hypertension.   Coreg 25 mg twice a day.  Norvasc 5 started on 10/25  Remains elevated, will consider further adjustments  Monitor with increased mobility 12.Hyperlipidemia. Lipitor 13. Hypoalbuminemia  Supplement initiated 10/24  LOS (Days) 4 A FACE TO FACE EVALUATION WAS PERFORMED  Keith Guzman T 07/24/2017 8:30 AM

## 2017-07-25 ENCOUNTER — Inpatient Hospital Stay (HOSPITAL_COMMUNITY): Payer: 59

## 2017-07-25 LAB — GLUCOSE, CAPILLARY
GLUCOSE-CAPILLARY: 151 mg/dL — AB (ref 65–99)
Glucose-Capillary: 126 mg/dL — ABNORMAL HIGH (ref 65–99)
Glucose-Capillary: 102 mg/dL — ABNORMAL HIGH (ref 65–99)
Glucose-Capillary: 188 mg/dL — ABNORMAL HIGH (ref 65–99)

## 2017-07-25 NOTE — Progress Notes (Signed)
[place] PHYSICAL MEDICINE & REHABILITATION     PROGRESS NOTE  Subjective/Complaints:  No problems. Pain controlled. Happy to have vac off  ROS: pt denies nausea, vomiting, diarrhea, cough, shortness of breath or chest pain   Objective: Vital Signs: Blood pressure (!) 149/79, pulse 85, temperature 98.4 F (36.9 C), temperature source Oral, resp. rate 18, height 5\' 11"  (1.803 m), weight 74.8 kg (165 lb), SpO2 99 %. No results found. No results for input(s): WBC, HGB, HCT, PLT in the last 72 hours. No results for input(s): NA, K, CL, GLUCOSE, BUN, CREATININE, CALCIUM in the last 72 hours.  Invalid input(s): CO CBG (last 3)   Recent Labs  07/24/17 1651 07/24/17 2049 07/25/17 0615  GLUCAP 184* 88 151*    Wt Readings from Last 3 Encounters:  07/22/17 74.8 kg (165 lb)  07/16/17 74.8 kg (165 lb)  05/06/17 73 kg (161 lb)    Physical Exam:  BP (!) 149/79 (BP Location: Right Arm)   Pulse 85   Temp 98.4 F (36.9 C) (Oral)   Resp 18   Ht 5\' 11"  (1.803 m)   Wt 74.8 kg (165 lb)   SpO2 99%   BMI 23.01 kg/m  Constitutional: He appears well-developed. No distress.  HENT: Normocephalicand atraumatic.  Eyes: EOMare normal. No discharge.  Cardiovascular: RRR without murmur. No JVD  . Respiratory: CTA Bilaterally without wheezes or rales. Normal effort .  GI: Bowel sounds are normal. He exhibits no distension.  Neurological: He is alertand oriented.  Motor: B/L UE 5/5 prox to distal.  RLE: 4+/5 proximal to distal LLE: HF 4+/5  Skin: left BK stump clean with staples/intact Psych: Easily agitated  Assessment/Plan: 1. Functional deficits secondary to left BKA which require 3+ hours per day of interdisciplinary therapy in a comprehensive inpatient rehab setting. Physiatrist is providing close team supervision and 24 hour management of active medical problems listed below. Physiatrist and rehab team continue to assess barriers to discharge/monitor patient progress toward  functional and medical goals.  Function:  Bathing Bathing position   Position: Wheelchair/chair at sink  Bathing parts Body parts bathed by patient: Right arm, Left arm, Chest, Abdomen, Front perineal area, Buttocks, Right upper leg, Left upper leg, Right lower leg    Bathing assist Assist Level: Touching or steadying assistance(Pt > 75%)      Upper Body Dressing/Undressing Upper body dressing   What is the patient wearing?: Pull over shirt/dress     Pull over shirt/dress - Perfomed by patient: Thread/unthread right sleeve, Thread/unthread left sleeve, Put head through opening, Pull shirt over trunk          Upper body assist Assist Level: Supervision or verbal cues      Lower Body Dressing/Undressing Lower body dressing   What is the patient wearing?: Pants     Pants- Performed by patient: Thread/unthread right pants leg, Thread/unthread left pants leg, Pull pants up/down, Fasten/unfasten pants   Non-skid slipper socks- Performed by patient: Don/doff right sock                    Lower body assist Assist for lower body dressing: Touching or steadying assistance (Pt > 75%)      Toileting Toileting   Toileting steps completed by patient: Adjust clothing prior to toileting, Performs perineal hygiene, Adjust clothing after toileting   Toileting Assistive Devices: Grab bar or rail  Toileting assist Assist level: Touching or steadying assistance (Pt.75%)   Transfers Chair/bed transfer   Chair/bed transfer  method: Stand pivot Chair/bed transfer assist level: Touching or steadying assistance (Pt > 75%) Chair/bed transfer assistive device: Armrests, Patent attorneyWalker     Locomotion Ambulation     Max distance: 50' Assist level: Supervision or verbal cues   Wheelchair   Type: Manual Max wheelchair distance: >150' Assist Level: Supervision or verbal cues  Cognition Comprehension Comprehension assist level: Follows basic conversation/direction with no assist   Expression Expression assist level: Expresses basic needs/ideas: With no assist  Social Interaction Social Interaction assist level: Interacts appropriately with others with medication or extra time (anti-anxiety, antidepressant).  Problem Solving Problem solving assist level: Solves basic problems with no assist  Memory Memory assist level: Recognizes or recalls 75 - 89% of the time/requires cueing 10 - 24% of the time    Medical Problem List and Plan: 1. Decreased functional mobilitysecondary to left BKA 07/16/2017  Continue CIR 2. DVT Prophylaxis/Anticoagulation: SCD 3. Pain Management: Robaxin and oxycodone as needed 4. Mood: Provide emotional support 5. Neuropsych: This patient iscapable of making decisions on hisown behalf. 6. Skin/Wound Care: Routine skin checks  Removed vac Saturday without any issues 7. Fluids/Electrolytes/Nutrition: Routine I&O 8.Acute on Chronic anemia. Patient receives Aranesp every 30 days.   Hb 8.4 on 10/24  Labs for Monday  Cont to monitor 9.Diabetes mellitus and peripheral neuropathy. Hemoglobin A1c 7.5. Glucotrol 10 mg daily, NovoLog 3 units TID with meals.   Check blood sugars before meals and at bedtime. Diabetic teaching  Lantus insulin 15 units daily at bedtime, decreased to 10 units on 10/26  -fair control at present 10.CKD stage III.   Cr. 1.74 on 10/24   Labs for Monday  Cont to monitor 11.Hypertension.   Coreg 25 mg twice a day.  Norvasc 5 started on 10/25  Some improvement over the last 48 hours--no changes today 12.Hyperlipidemia. Lipitor 13. Hypoalbuminemia  Supplement initiated 10/24  LOS (Days) 5 A FACE TO FACE EVALUATION WAS PERFORMED  SWARTZ,ZACHARY T 07/25/2017 8:50 AM

## 2017-07-25 NOTE — Progress Notes (Signed)
Occupational Therapy Session Note  Patient Details  Name: Keith Guzman MRN: [id number] Date of Birth: 05-17-1961  Today's Date: 07/25/2017 OT Individual Time: 0900-1000 OT Individual Time Calculation (min): 60 min    Short Term Goals: Week 1:  OT Short Term Goal 1 (Week 1): STGs equal to LTGs set at modified independent to supervision.    Skilled Therapeutic Interventions/Progress Updates:    1:1. No pain reported. Pt declines bathing at shower level as wound vac is off since pt has no clothes. Pt completes lateral scoot transfer EOB to w/c with supervision. Pt completes oral care at sink seated in w/c with increased time.  Pt bathes UB/ front peri area at sink with set up. Pt dons old pull over shirt with set up. Pt propels w/c to/from all tx destinations with increased time and VC for directions 2/2 visual deficits. Pt completes 2x10 BUE therex with 3# weights in all planes of motion to improve BUE strength required for BADLs and functional mobility. Exited session with pt seated in bed with call light in reach and all needs met.  Therapy Documentation Precautions:  Precautions Precautions: Fall Precaution Comments: Wound vac Restrictions Weight Bearing Restrictions: Yes LLE Weight Bearing: Non weight bearing Other Position/Activity Restrictions: L BKA See Function Navigator for Current Functional Status.   Therapy/Group: Individual Therapy  Tonny Branch 07/25/2017, 6:55 AM

## 2017-07-25 NOTE — Progress Notes (Signed)
Physical Therapy Session Note  Patient Details  Name: GURVEER MANNINGS MRN: [id number] Date of Birth: [birth year]  Today's Date: 07/25/2017 PT Individual Time: 1100-1200, 1430-1530 PT Individual Time Calculation (min): 60 min , 60 min   Short Term Goals: Week 1:  PT Short Term Goal 1 (Week 1): STG=LTG due to ELOS  Skilled Therapeutic Interventions/Progress Updates:    Session 1: Pt supine in bed upon PT arrival, agreeable to therapy tx and denies pain. Pt transferred from supine to sitting EOB with supervision. Pt transferred from bed<>w/c squat pivot with supervision, and set up of w/c. Pt propelled w/c x 200 ft from room <>dayroom with supervision. Pt ambulated 2 x 45 ft with RW and min assist, single LOB with turning but able to correct. Pt used nustep x 6 minutes with R LE and B UEs for activity tolerance and strengthening on workload level 4. Pt eager to go home sooner than planned, therapist discussed d/c planning and urged pt to have family come in to practice bumping w/c up/down steps, pt agreeable but also states they already know how to do it. Pt performed hip strengthening exercises while standing at parallel bars using L LE 2 x 10 hip abduction and 2 x 10 hip extension. Pt performed seated therex 2 x 10 LAQ and 2 x 10 quad sets with L LE. Pt left seated in w/c at end of session with needs in reach.   Session 2: Pt seated in w/c upon PT arrival, agreeable to therapy tx and denies pain. Pt propelled w/c from room<>gym x 150 ft each way with supervision. Pt requesting to do strengthening exercises this session. Pt standing performed hip flex/abduction/extension 2 x 10 in each direction with L LE and pt performed 2 x 5 single leg mini squats with R LE. Pt performed seated therex 2 x 10 LAQ, 2 x 10 hip abduction with level 2 TB and 2 x 10 quad sets with L LE. Pt performed UE strengthening exercises: 2 x10 shoulder press 4# dowel, 2 x 10 shoulder flexion 4# dowel, 2 x 10 bicep curls 5# dumbbells, 2  x 10 shoulder abduction 3# dumbbells. Pt worked on standing dynamic balance with single UE support on RW in order to throw horseshoes, x 2 trials. Pt left seated in w/c at end of session with needs in reach.     Therapy Documentation Precautions:  Precautions Precautions: Fall Precaution Comments: Wound vac Restrictions Weight Bearing Restrictions: Yes LLE Weight Bearing: Non weight bearing Other Position/Activity Restrictions: L BKA   See Function Navigator for Current Functional Status.   Therapy/Group: Individual Therapy  Cresenciano Genre, PT, DPT 07/25/2017, 11:04 AM

## 2017-07-26 ENCOUNTER — Inpatient Hospital Stay (HOSPITAL_COMMUNITY): Payer: 59

## 2017-07-26 ENCOUNTER — Ambulatory Visit: Payer: 59

## 2017-07-26 ENCOUNTER — Other Ambulatory Visit: Payer: 59

## 2017-07-26 ENCOUNTER — Inpatient Hospital Stay (HOSPITAL_COMMUNITY): Payer: 59 | Admitting: Physical Therapy

## 2017-07-26 ENCOUNTER — Inpatient Hospital Stay (HOSPITAL_COMMUNITY): Payer: 59 | Admitting: Occupational Therapy

## 2017-07-26 DIAGNOSIS — N183 Chronic kidney disease, stage 3 unspecified: Secondary | ICD-10-CM

## 2017-07-26 LAB — GLUCOSE, CAPILLARY
GLUCOSE-CAPILLARY: 97 mg/dL (ref 65–99)
Glucose-Capillary: 164 mg/dL — ABNORMAL HIGH (ref 65–99)
Glucose-Capillary: 146 mg/dL — ABNORMAL HIGH (ref 65–99)
Glucose-Capillary: 168 mg/dL — ABNORMAL HIGH (ref 65–99)

## 2017-07-26 LAB — CBC WITH DIFFERENTIAL/PLATELET
BASOS ABS: 0 10*3/uL (ref 0.0–0.1)
Basophils Relative: 0 %
EOS ABS: 0.4 10*3/uL (ref 0.0–0.7)
EOS PCT: 5 %
HCT: 29.4 % — ABNORMAL LOW (ref 39.0–52.0)
Hemoglobin: 8.8 g/dL — ABNORMAL LOW (ref 13.0–17.0)
LYMPHS PCT: 26 %
Lymphs Abs: 1.8 10*3/uL (ref 0.7–4.0)
MCH: 23.5 pg — AB (ref 26.0–34.0)
MCHC: 29.9 g/dL — ABNORMAL LOW (ref 30.0–36.0)
MCV: 78.4 fL (ref 78.0–100.0)
MONO ABS: 0.8 10*3/uL (ref 0.1–1.0)
Monocytes Relative: 12 %
Neutro Abs: 4.1 10*3/uL (ref 1.7–7.7)
Neutrophils Relative %: 57 %
PLATELETS: 387 10*3/uL (ref 150–400)
RBC: 3.75 MIL/uL — AB (ref 4.22–5.81)
RDW: 16.4 % — AB (ref 11.5–15.5)
WBC: 7.2 10*3/uL (ref 4.0–10.5)

## 2017-07-26 LAB — BASIC METABOLIC PANEL
ANION GAP: 6 (ref 5–15)
BUN: 28 mg/dL — AB (ref 6–20)
CALCIUM: 8.4 mg/dL — AB (ref 8.9–10.3)
CO2: 27 mmol/L (ref 22–32)
Chloride: 106 mmol/L (ref 101–111)
Creatinine, Ser: 1.97 mg/dL — ABNORMAL HIGH (ref 0.61–1.24)
GFR calc Af Amer: 41 mL/min — ABNORMAL LOW (ref >60)
GFR, EST NON AFRICAN AMERICAN: 36 mL/min — AB (ref >60)
GLUCOSE: 84 mg/dL (ref 65–99)
POTASSIUM: 4 mmol/L (ref 3.5–5.1)
SODIUM: 139 mmol/L (ref 135–145)

## 2017-07-26 NOTE — Progress Notes (Signed)
Pt c/o of abdominal pain, constipation, and nausea. When first offered, pt refused scheduled medications, PRN Miralax, PRN Zofran, digital stimulation, and manual disimpaction. Pt accepted RN's suggestion to offer medications at a later time. Pt refused medications on the second attempt. On-call provider contacted; KUB recommended and will continue to monitor  Ashton Belote Cottie Banda, RN

## 2017-07-26 NOTE — Progress Notes (Signed)
Physical Therapy Session Note  Patient Details  Name: [name] MRN: [id number] Date of Birth: 09-03-1961  Today's Date: 07/26/2017 PT Individual Time: 1130-1200 PT Individual Time Calculation (min): 30 min  Missed Time: 60 minutes  Short Term Goals: Week 1:  PT Short Term Goal 1 (Week 1): STG=LTG due to ELOS  Skilled Therapeutic Interventions/Progress Updates:    Session 1: Pt seated in recliner upon PT arrival, agreeable to therapy tx and denies pain. Pt donned pants while seated in recliner, leaning to each side to pull over hips with supervision. Pt performed squat pivot transfer from recliner>w/c with supervision. Pt propelled w/c from room>gym with supervision x 150 ft. Pt standing with RW performed therex 2 x 10 hip extension, 2 x 10 hip abduction. Pt performed squat pivot transfer w/c<>rehab apartment gym with supervision, squat pivot. Pt transported back to room. Pt transferred w/c>bed with supervision and left supine in bed with needs in reach.   Session 2: Pt missed 60 minutes of skilled therapy this session. Pt reported that he has not yet had a BM, is not feeling well and would not like to participate in this session. Therapist explained the benefits of exercise on having BM and pt continued to decline. Therapist suggested bed level exercises or exercises in the room and pt declined.     Therapy Documentation Precautions:  Precautions Precautions: Fall Precaution Comments: Wound vac Restrictions Weight Bearing Restrictions: Yes LLE Weight Bearing: Non weight bearing Other Position/Activity Restrictions: L BKA   See Function Navigator for Current Functional Status.   Therapy/Group: Individual Therapy  Cresenciano Genre, PT, DPT 07/26/2017, 12:11 PM

## 2017-07-26 NOTE — Progress Notes (Signed)
Physical Therapy Session Note  Patient Details  Name: Keith Guzman MRN: [id number] Date of Birth: 22-Oct-1960  Today's Date: 07/26/2017 PT Missed time: 45 min (patient unwilling to participate, other)   Skilled Therapeutic Interventions/Progress Updates:   Pt toileting upon arrival and asking to finish prior to participating in therapy. Spoke w/ RN and NT staff on unit, all of which stated pt did not call for assistance to bathroom and pt states he got to the toilet by himself. Educated pt on calling for assistance to toilet for safety as pt requires supervision and cues for safe transfers at this time. Pt requesting to stay on toilet 2/2 constipation and missed entire session.  See Function Navigator for Current Functional Status.   Therapy/Group: Individual Therapy  Anjolina Byrer K Arnette 07/26/2017, 2:41 PM

## 2017-07-26 NOTE — Progress Notes (Signed)
Occupational Therapy Session Note  Patient Details  Name: Keith Guzman MRN: [id number] Date of Birth: May 02, 1961  Today's Date: 07/26/2017 OT Individual Time:  -   10:00-11:00   (60 min)      Short Term Goals: Week 1:  OT Short Term Goal 1 (Week 1): STGs equal to LTGs set at modified independent to supervision.       Skilled Therapeutic Interventions/Progress Updates:    Ppt lying in bed upon OT arrival.   Focus of treatment was bed mobility, transfers, sitting balance, standing balance,  therapeutic activities.  Pt complained of not having clean clothes to wear.Called wife and left message for her to bring some clothes  Refused to shower, but agreed to take sink bath.  Pt transferred from bed to wc with SBA.  Propelled wc to sink.  Assisted pt with set up for sink bath.  Pt took increased time, but did with no assist.  Did sit to stand with CGA for balance.  Pt reports has no vision in right eye and left eye is partial sight.  Ambulated with RW to bathroom and dressing.  Left pt to complete BM with call cord in reach.  Informed nursing as to pt's position.    Therapy Documentation Precautions:  Precautions Precautions: Fall Precaution Comments: Wound vac Restrictions Weight Bearing Restrictions: Yes LLE Weight Bearing: Non weight bearing Other Position/Activity Restrictions: L BKA    Pain:  none               See Function Navigator for Current Functional Status.   Therapy/Group: Individual Therapy  Humberto Seals 07/26/2017, 10:29 AM

## 2017-07-26 NOTE — Progress Notes (Signed)
[place] PHYSICAL MEDICINE & REHABILITATION     PROGRESS NOTE  Subjective/Complaints:  Patient seen lying in bed this morning.  He states he slept well overnight and had a good weekend.  He denies any issues with his stump.  ROS: Denies nausea, vomiting, diarrhea, shortness of breath or chest pain   Objective: Vital Signs: Blood pressure (!) 158/82, pulse 84, temperature 98.2 F (36.8 C), temperature source Oral, resp. rate 18, height 5\' 11"  (1.803 m), weight 74.8 kg (165 lb), SpO2 99 %. No results found.  Recent Labs  07/26/17 0710  WBC 7.2  HGB 8.8*  HCT 29.4*  PLT 387    Recent Labs  07/26/17 0710  NA 139  K 4.0  CL 106  GLUCOSE 84  BUN 28*  CREATININE 1.97*  CALCIUM 8.4*   CBG (last 3)   Recent Labs  07/25/17 1648 07/25/17 2137 07/26/17 0621  GLUCAP 102* 188* 97    Wt Readings from Last 3 Encounters:  07/22/17 74.8 kg (165 lb)  07/16/17 74.8 kg (165 lb)  05/06/17 73 kg (161 lb)    Physical Exam:  BP (!) 158/82 (BP Location: Right Arm)   Pulse 84   Temp 98.2 F (36.8 C) (Oral)   Resp 18   Ht 5\' 11"  (1.803 m)   Wt 74.8 kg (165 lb)   SpO2 99%   BMI 23.01 kg/m  Constitutional: He appears well-developed. No distress.  HENT: Normocephalicand atraumatic.  Eyes: EOMare normal. No discharge.  Cardiovascular: RRR. No JVD. Respiratory: CTA Bilaterally. Normal effort. GI: Bowel sounds are normal. He exhibits no distension.  Neurological: He is alertand oriented.  Motor: B/L UE 5/5 prox to distal.  RLE: 4+/5 proximal to distal LLE: HF, KE 4+/5  Skin: left BK stump with staples clean, dry, intact Psych: Easily agitated  Assessment/Plan: 1. Functional deficits secondary to left BKA which require 3+ hours per day of interdisciplinary therapy in a comprehensive inpatient rehab setting. Physiatrist is providing close team supervision and 24 hour management of active medical problems listed below. Physiatrist and rehab team continue to assess  barriers to discharge/monitor patient progress toward functional and medical goals.  Function:  Bathing Bathing position   Position: Wheelchair/chair at sink  Bathing parts Body parts bathed by patient: Right arm, Left arm, Chest, Abdomen, Front perineal area, Buttocks, Right upper leg, Left upper leg, Right lower leg    Bathing assist Assist Level: Touching or steadying assistance(Pt > 75%)      Upper Body Dressing/Undressing Upper body dressing   What is the patient wearing?: Pull over shirt/dress     Pull over shirt/dress - Perfomed by patient: Thread/unthread right sleeve, Thread/unthread left sleeve, Put head through opening, Pull shirt over trunk          Upper body assist Assist Level: Supervision or verbal cues      Lower Body Dressing/Undressing Lower body dressing   What is the patient wearing?: Pants     Pants- Performed by patient: Thread/unthread right pants leg, Thread/unthread left pants leg, Pull pants up/down, Fasten/unfasten pants   Non-skid slipper socks- Performed by patient: Don/doff right sock                    Lower body assist Assist for lower body dressing: Touching or steadying assistance (Pt > 75%)      Toileting Toileting   Toileting steps completed by patient: Adjust clothing prior to toileting, Performs perineal hygiene, Adjust clothing after toileting   Toileting  Assistive Devices: Grab bar or rail  Toileting assist Assist level: Touching or steadying assistance (Pt.75%)   Transfers Chair/bed transfer   Chair/bed transfer method: Stand pivot, Squat pivot Chair/bed transfer assist level: Supervision or verbal cues Chair/bed transfer assistive device: Armrests, Patent attorneyWalker     Locomotion Ambulation     Max distance: 45 ft Assist level: Touching or steadying assistance (Pt > 75%)   Wheelchair   Type: Manual Max wheelchair distance: >150' Assist Level: Supervision or verbal cues  Cognition Comprehension Comprehension assist  level: Follows basic conversation/direction with no assist  Expression Expression assist level: Expresses basic needs/ideas: With no assist  Social Interaction Social Interaction assist level: Interacts appropriately with others with medication or extra time (anti-anxiety, antidepressant).  Problem Solving Problem solving assist level: Solves basic problems with no assist  Memory Memory assist level: Recognizes or recalls 75 - 89% of the time/requires cueing 10 - 24% of the time    Medical Problem List and Plan: 1. Decreased functional mobilitysecondary to left BKA 07/16/2017  Continue CIR  Will consider stump shrinker later this week 2. DVT Prophylaxis/Anticoagulation: SCD 3. Pain Management: Robaxin and oxycodone as needed 4. Mood: Provide emotional support 5. Neuropsych: This patient iscapable of making decisions on hisown behalf. 6. Skin/Wound Care: Routine skin checks  Removed vac Saturday without any issues 7. Fluids/Electrolytes/Nutrition: Routine I&O 8.Acute on Chronic anemia. Patient receives Aranesp every 30 days.   Hb 8.8 on 10/29  Cont to monitor 9.Diabetes mellitus and peripheral neuropathy. Hemoglobin A1c 7.5. Glucotrol 10 mg daily, NovoLog 3 units TID with meals.   Check blood sugars before meals and at bedtime. Diabetic teaching  Lantus insulin 15 units daily at bedtime, decreased to 10 units on 10/26  Remains labile, but overall controlled 10.CKD stage III.   Cr.  1.97 on 10/29   Encourage fluids  Cont to monitor 11.Hypertension.   Coreg 25 mg twice a day.  Norvasc 5 started on 10/25  Remains labile, but overall controlled 12.Hyperlipidemia. Lipitor 13. Hypoalbuminemia  Supplement initiated 10/24  LOS (Days) 6 A FACE TO FACE EVALUATION WAS PERFORMED  Unnamed Hino Karis Jubanil Berdena Cisek 07/26/2017 9:34 AM

## 2017-07-26 NOTE — Progress Notes (Signed)
Pt sitting on toilet c/o of constipation, unable to completely evacuate at approximately 1500. Upon inspection, pt noted to have clay like stool protruding from the rectum. Pt educated on digital stimulation, but reluctant to try. Pt offered suppository and accepted, but suppository unsuccessful. Pt allowed some digital stimulation but this RN was only able to remove small amount before pt asked stimulation be stopped. Pt. Wanted to remain on the toilet. Pt encouraged to not to push but rock gently back and forth. Pt remained on toilet until 1600 without having a BM. At 1700 pt returned to toilet but was unable to have BM. Pt re-educated on digital stimulation to help disimpact. Pt declined. Pt offered and accepted PRN sorbitol at 1852. Report given to night nurse.

## 2017-07-27 ENCOUNTER — Inpatient Hospital Stay (HOSPITAL_COMMUNITY): Payer: 59 | Admitting: Physical Therapy

## 2017-07-27 ENCOUNTER — Inpatient Hospital Stay (HOSPITAL_COMMUNITY): Payer: 59

## 2017-07-27 ENCOUNTER — Inpatient Hospital Stay (HOSPITAL_COMMUNITY): Payer: 59 | Admitting: Occupational Therapy

## 2017-07-27 DIAGNOSIS — K5901 Slow transit constipation: Secondary | ICD-10-CM

## 2017-07-27 LAB — GLUCOSE, CAPILLARY
GLUCOSE-CAPILLARY: 212 mg/dL — AB (ref 65–99)
GLUCOSE-CAPILLARY: 259 mg/dL — AB (ref 65–99)
GLUCOSE-CAPILLARY: 134 mg/dL — AB (ref 65–99)
Glucose-Capillary: 119 mg/dL — ABNORMAL HIGH (ref 65–99)

## 2017-07-27 MED ORDER — MAGNESIUM CITRATE PO SOLN
1.0000 | Freq: Once | ORAL | Status: AC
  Administered 2017-07-27: 1 via ORAL
  Filled 2017-07-27 (×2): qty 296

## 2017-07-27 MED ORDER — SENNOSIDES-DOCUSATE SODIUM 8.6-50 MG PO TABS
2.0000 | ORAL_TABLET | Freq: Two times a day (BID) | ORAL | Status: DC
  Administered 2017-07-27 – 2017-07-31 (×6): 2 via ORAL
  Filled 2017-07-27 (×8): qty 2

## 2017-07-27 MED ORDER — POLYETHYLENE GLYCOL 3350 17 G PO PACK
17.0000 g | PACK | Freq: Every day | ORAL | Status: DC
  Administered 2017-07-28 – 2017-07-31 (×3): 17 g via ORAL
  Filled 2017-07-27 (×3): qty 1

## 2017-07-27 MED ORDER — AMLODIPINE BESYLATE 2.5 MG PO TABS
2.5000 mg | ORAL_TABLET | Freq: Two times a day (BID) | ORAL | Status: DC
  Administered 2017-07-27 – 2017-07-29 (×6): 2.5 mg via ORAL
  Filled 2017-07-27 (×6): qty 1

## 2017-07-27 MED ORDER — FLEET ENEMA 7-19 GM/118ML RE ENEM
1.0000 | ENEMA | Freq: Once | RECTAL | Status: AC
  Administered 2017-07-27: 1 via RECTAL
  Filled 2017-07-27: qty 1

## 2017-07-27 NOTE — Progress Notes (Signed)
Occupational Therapy Weekly Progress Note  Patient Details  Name: Keith Guzman MRN: [id number] Date of Birth: 11-11-1960  Beginning of progress report period: June 21, 2017 End of progress report period: July 27, 2017  Today's Date: 07/27/2017 OT Individual Time: 4098-1191 OT Individual Time Calculation (min): 55 min   Pt continues to make steady progress with OT at this time.  He completes shower and toilet transfers with min assist using the RW for support.  He can complete squat pivot transfers with min guard to supervision as well from bed to wheelchair and back.  Sit to stands are at min assist level with min instructional cueing for hand placement.  Overall goals are set at supervision to modified independent level.  Recommend continued CIR level OT to progress further to reach this goals with anticipated d/c of 11/3   Patient continues to demonstrate the following deficits: muscle weakness and decreased standing balance and decreased balance strategies and therefore will continue to benefit from skilled OT intervention to enhance overall performance with BADL and Reduce care partner burden.  Patient progressing toward long term goals..  Continue plan of care.  OT Short Term Goals Week 2:  OT Short Term Goal 1 (Week 2): STGs equal to LTGs set at modified independent to supervision.    Skilled Therapeutic Interventions/Progress Updates:    Pt on the toilet when therapist entered, complaining of stomach pain and not being able to use the bathroom.  Pt with small amount of stool in the toilet when he finished with completion of toilet hygiene in sitting with lateral leans and supervision.  Had him complete stand pivot transfer from toilet to the shower bench with use of the RW and min assist.  He completed showering with supervision in sitting with lateral leans to wash peri area.  Transferred again to the 3:1 over the toilet with min assist for second attempt at [facility].  Pt unsuccessful  so transferred to the wheelchair with min assist,  Nursing notified of pt's current condition and pain.  They came in to administer fleets enema.  Pt transferred from wheelchair back to the bed with min guard assist squat pivot and min demonstrational cueing for hand placement during transfer.  Pt left with nursing at end of session.    Therapy Documentation Precautions:  Precautions Precautions: Fall Precaution Comments: Wound vac Restrictions Weight Bearing Restrictions: No LLE Weight Bearing: Non weight bearing Other Position/Activity Restrictions: L BKA  Pain: Pain Assessment Pain Assessment: Faces Faces Pain Scale: Hurts little more Pain Type: Acute pain Pain Location: Abdomen Pain Orientation: Lower Pain Descriptors / Indicators: Discomfort Pain Onset: Progressive Pain Intervention(s): Repositioned ADL: See Function Navigator for Current Functional Status.   Therapy/Group: Individual Therapy  Sherlock Nancarrow OTR/L 07/27/2017, 12:32 PM

## 2017-07-27 NOTE — Progress Notes (Signed)
Pt passed two small hard lumps of stool and vomited a few minutes later; emesis was brown in color; pt still c/o abdominal pain and nausea; KUB ordered and pt awaiting transport  Karrina Lye Cottie Banda, RN

## 2017-07-27 NOTE — Progress Notes (Signed)
Ke[facility] Guz[facility] PHYSICAL MEDICINE & REHABILITATION     PROGRESS NOTE  Subjective/Complaints:  Patient seen lying in bed this morning. He states he slept well overnight. This morning he states "my bowels are stuck in my stomach".  ROS: + Constipation. Denies nausea, vomiting, diarrhea, shortness of breath or chest pain   Objective: Vital Signs: Blood pressure (!) 177/97, pulse (!) 101, temperature 97.8 F (36.6 C), temperature source Oral, resp. rate (!) 22, height 5\' 11"  (1.803 m), weight 74.8 kg (165 lb), SpO2 99 %. Dg Abd 1 View  Result Date: 07/27/2017 CLINICAL DATA:  Constipation and abdominal pain.  Nausea. EXAM: ABDOMEN - 1 VIEW COMPARISON:  None. FINDINGS: Moderate stool in the ascending and transverse colon, with small volume of stool distally. Small volume of rectal stool. No small bowel dilatation. No evidence of free air on single supine view. No radiopaque calculi. Visualized osseous structures are intact. IMPRESSION: Moderate colonic stool burden without bowel obstruction. Electronically Signed   By: Rubye OaksMelanie  Ehinger M.D.   On: 07/27/2017 04:10    Recent Labs  07/26/17 0710  WBC 7.2  HGB 8.8*  HCT 29.4*  PLT 387    Recent Labs  07/26/17 0710  NA 139  K 4.0  CL 106  GLUCOSE 84  BUN 28*  CREATININE 1.97*  CALCIUM 8.4*   CBG (last 3)   Recent Labs  07/26/17 1655 07/26/17 2132 07/27/17 0635  GLUCAP 146* 168* 212*    Wt Readings from Last 3 Encounters:  07/22/17 74.8 kg (165 lb)  07/16/17 74.8 kg (165 lb)  05/06/17 73 kg (161 lb)    Physical Exam:  BP (!) 177/97 (BP Location: Right Arm)   Pulse (!) 101   Temp 97.8 F (36.6 C) (Oral)   Resp (!) 22   Ht 5\' 11"  (1.803 m)   Wt 74.8 kg (165 lb)   SpO2 99%   BMI 23.01 kg/m  Constitutional: He appears well-developed. No distress.  HENT: Normocephalicand atraumatic.  Eyes: EOMare normal. No discharge.  Cardiovascular: RRR. No JVD. Respiratory: CTA Bilaterally. Normal effort. GI: Bowel sounds are  normal. He exhibits no distension.  Neurological: He is alertand oriented.  Motor: B/L UE 5/5 prox to distal.  RLE: 4+/5 proximal to distal LLE: HF, KE 4+/5 (unchanged)  Skin: left BK stump with staples clean, dry, intact Psych: Easily agitated  Assessment/Plan: 1. Functional deficits secondary to left BKA which require 3+ hours per day of interdisciplinary therapy in a comprehensive inpatient rehab setting. Physiatrist is providing close team supervision and 24 hour management of active medical problems listed below. Physiatrist and rehab team continue to assess barriers to discharge/monitor patient progress toward functional and medical goals.  Function:  Bathing Bathing position   Position: Wheelchair/chair at sink  Bathing parts Body parts bathed by patient: Right arm, Left arm, Chest, Abdomen, Front perineal area, Buttocks, Right upper leg, Left upper leg, Right lower leg Body parts bathed by helper: Back  Bathing assist Assist Level: Supervision or verbal cues      Upper Body Dressing/Undressing Upper body dressing   What is the patient wearing?: Pull over shirt/dress     Pull over shirt/dress - Perfomed by patient: Thread/unthread right sleeve, Thread/unthread left sleeve, Put head through opening, Pull shirt over trunk          Upper body assist Assist Level: Supervision or verbal cues      Lower Body Dressing/Undressing Lower body dressing   What is the patient wearing?: Pants  Pants- Performed by patient: Thread/unthread right pants leg, Thread/unthread left pants leg, Pull pants up/down, Fasten/unfasten pants   Non-skid slipper socks- Performed by patient: Don/doff right sock                    Lower body assist Assist for lower body dressing: Supervision or verbal cues      Toileting Toileting   Toileting steps completed by patient: Adjust clothing prior to toileting, Performs perineal hygiene, Adjust clothing after toileting Toileting steps  completed by helper: Adjust clothing prior to toileting, Adjust clothing after toileting (per Epimenio Foot, NT assist with clothing) Toileting Assistive Devices: Grab bar or rail  Toileting assist Assist level: Supervision or verbal cues   Transfers Chair/bed transfer   Chair/bed transfer method: Stand pivot, Squat pivot Chair/bed transfer assist level: Supervision or verbal cues Chair/bed transfer assistive device: Armrests, Patent attorney     Max distance: 45 ft Assist level: Touching or steadying assistance (Pt > 75%)   Wheelchair   Type: Manual Max wheelchair distance: >150' Assist Level: Supervision or verbal cues  Cognition Comprehension Comprehension assist level: Follows basic conversation/direction with no assist  Expression Expression assist level: Expresses basic needs/ideas: With no assist  Social Interaction Social Interaction assist level: Interacts appropriately with others with medication or extra time (anti-anxiety, antidepressant).  Problem Solving Problem solving assist level: Solves basic problems with no assist  Memory Memory assist level: Recognizes or recalls 75 - 89% of the time/requires cueing 10 - 24% of the time    Medical Problem List and Plan: 1. Decreased functional mobilitysecondary to left BKA 07/16/2017  Continue CIR  Will consider stump shrinker later this week 2. DVT Prophylaxis/Anticoagulation: SCD 3. Pain Management: Robaxin and oxycodone as needed 4. Mood: Provide emotional support 5. Neuropsych: This patient iscapable of making decisions on hisown behalf. 6. Skin/Wound Care: Routine skin checks  Removed vac Saturday without any issues 7. Fluids/Electrolytes/Nutrition: Routine I&O 8.Acute on Chronic anemia. Patient receives Aranesp every 30 days.   Hb 8.8 on 10/29  Cont to monitor 9.Diabetes mellitus and peripheral neuropathy. Hemoglobin A1c 7.5. Glucotrol 10 mg daily, NovoLog 3 units TID with meals.   Check blood  sugars before meals and at bedtime. Diabetic teaching  Lantus insulin 15 units daily at bedtime, decreased to 10 units on 10/26  ? Trending up. Will consider adjustments tomorrow. 10.CKD stage III.   Cr.  1.97 on 10/29   Encourage fluids  Labs ordered for tomorrow  Cont to monitor 11.Hypertension.   Coreg 25 mg twice a day.  Norvasc 5 started on 10/25, changed to 2.5 BID on 10/30 for better consistent control 12.Hyperlipidemia. Lipitor 13. Hypoalbuminemia  Supplement initiated 10/24 14. Constipation  KUB reviewed suggesting constipation  Bowel regimen increased on 10/30  LOS (Days) 7 A FACE TO FACE EVALUATION WAS PERFORMED  Ankit [name] 07/27/2017 8:46 AM

## 2017-07-27 NOTE — Progress Notes (Signed)
Social Work Patient ID: Keith Guzman, male   DOB: 02-24-1961, 56 y.o.   MRN: [id number]  Pt has refused therapy this am due to bowel issues. Dan-PA and RN working on this. He is nauseous and full of stool, according to PA.

## 2017-07-27 NOTE — Progress Notes (Signed)
Physical Therapy Session Note  Patient Details  Name: Keith Guzman MRN: [id number] Date of Birth: [birth year]  Today's Date: 07/27/2017 PT Individual Time: 1300-1330 PT Individual Time Calculation (min): 30 min  and Today's Date: 07/27/2017 PT Missed Time: 60 Minutes Missed Time Reason: Patient unwilling to participate;Pain  Short Term Goals: Week 1:  PT Short Term Goal 1 (Week 1): STG=LTG due to ELOS  Skilled Therapeutic Interventions/Progress Updates:    Session 1: Pt supine in bed upon PT arrival, wife present in the room as well. Pt reports still not feeling well and just got back to bed. Pt and wife agreeable to go to gym for family training on bumping up steps. Pt transferred from supine>sitting EOB with supervision. Pt performed squat pivot transferred from w/c<>bed with supervision. Pt requiring assist for management of w/c parts secondary to visual deficits, educated wife on w/c parts management. Pt propelled w/c>gym x 150 ft with supervision. Therapist performed stair bumping x 1 while educating wife on techniques, wife performed x 1.   Session 2: Pt declined participation secondary to abdomen pain, constipation and difficulty having BM.   Therapy Documentation Precautions:  Precautions Precautions: Fall Precaution Comments: Wound vac Restrictions Weight Bearing Restrictions: No LLE Weight Bearing: Non weight bearing Other Position/Activity Restrictions: L BKA General: PT Amount of Missed Time (min): 90 Minutes PT Missed Treatment Reason: Patient unwilling to participate;Other (Comment);Pain (Pt refusing therapy this morning 2/2 abdominal pain and discomfort)   See Function Navigator for Current Functional Status.   Therapy/Group: Individual Therapy  Cresenciano Genre, PT, DPT 07/27/2017, 1:54 PM

## 2017-07-27 NOTE — Progress Notes (Signed)
Pt continues to have small bowel movements and complaints of abdominal cramping. Pt drinking magnesium citrate but abdomen still feels distended. Will continue to monitor bowel movements.

## 2017-07-27 NOTE — Significant Event (Signed)
Patient's scheduled Lipitor and Coreg rescheduled by previous RN; pt refused rescheduled medications and on-schedule medications, Colace and Pro-Stat.  Harlow Asa, RN

## 2017-07-27 NOTE — Progress Notes (Signed)
Physical Therapy Session Note  Patient Details  Name: Keith Guzman MRN: [id number] Date of Birth: 04-Feb-1961  Today's Date: 07/27/2017 PT Missed Time: 90 Minutes Missed Time Reason: Patient unwilling to participate;Other (Comment);Pain (Pt refusing therapy this morning 2/2 abdominal pain and discomfort)  Short Term Goals: Week 1:  PT Short Term Goal 1 (Week 1): STG=LTG due to ELOS  Skilled Therapeutic Interventions/Progress Updates:   Pt refused to participate in both scheduled sessions this morning because of stomach pain and constipation. Offered assistance to get up and eat crackers or drink ginger ale or mobilize to help bowels, pt continued to decline. RN and MD aware of constipation issues.  Therapy Documentation Precautions:  Precautions Precautions: Fall Precaution Comments: Wound vac Restrictions Weight Bearing Restrictions: Yes LLE Weight Bearing: Non weight bearing Other Position/Activity Restrictions: L BKA  See Function Navigator for Current Functional Status.   Therapy/Group: Individual Therapy  Keith Guzman 07/27/2017, 11:33 AM

## 2017-07-27 NOTE — Progress Notes (Signed)
Pt is complaining of a lot of abdominal pain and cramping this am. Pt refusing to eat anything due to nausea. Pt's abdomen is hard and bowel sounds are hypoactive. Pt given suppository this am with little results. RN called to room during OT session for abdominal cramping complaints and pt agreed to allow RN to perform enema. Pt given enema with little results. PA notified of patient symptoms and magnesium citrate ordered. Will continue to encourage stool softners and laxatives. Pt educated on importance of bowel interventions to prevent ileus and other bowel related complications.

## 2017-07-27 NOTE — Progress Notes (Signed)
Social Work Patient ID: Keith Guzman, male   DOB: 04-21-1961, 56 y.o.   MRN: [id number]  Met with pt and wife who is here providing support to pt. Encouraged her to go to therapies with him and learn how to bump him up their stairs into home. She will see if he goes to therapies, he is having issues with his bowels. Pt is wanting to leave sooner than Sat, will need to completed family education and medically be cleared before discharge home.

## 2017-07-28 ENCOUNTER — Inpatient Hospital Stay (HOSPITAL_COMMUNITY): Payer: 59

## 2017-07-28 ENCOUNTER — Inpatient Hospital Stay (HOSPITAL_COMMUNITY): Payer: 59 | Admitting: Occupational Therapy

## 2017-07-28 DIAGNOSIS — N179 Acute kidney failure, unspecified: Secondary | ICD-10-CM

## 2017-07-28 LAB — GLUCOSE, CAPILLARY
GLUCOSE-CAPILLARY: 108 mg/dL — AB (ref 65–99)
GLUCOSE-CAPILLARY: 202 mg/dL — AB (ref 65–99)
Glucose-Capillary: 91 mg/dL (ref 65–99)
Glucose-Capillary: 78 mg/dL (ref 65–99)
Glucose-Capillary: 156 mg/dL — ABNORMAL HIGH (ref 65–99)

## 2017-07-28 LAB — BASIC METABOLIC PANEL
Anion gap: 8 (ref 5–15)
BUN: 36 mg/dL — AB (ref 6–20)
CO2: 27 mmol/L (ref 22–32)
CREATININE: 2.38 mg/dL — AB (ref 0.61–1.24)
Calcium: 8.9 mg/dL (ref 8.9–10.3)
Chloride: 104 mmol/L (ref 101–111)
GFR calc Af Amer: 33 mL/min — ABNORMAL LOW (ref >60)
GFR, EST NON AFRICAN AMERICAN: 28 mL/min — AB (ref >60)
GLUCOSE: 108 mg/dL — AB (ref 65–99)
POTASSIUM: 3.8 mmol/L (ref 3.5–5.1)
SODIUM: 139 mmol/L (ref 135–145)

## 2017-07-28 MED ORDER — SODIUM CHLORIDE 0.9 % IV SOLN
INTRAVENOUS | Status: DC
  Administered 2017-07-28: 18:00:00 via INTRAVENOUS

## 2017-07-28 NOTE — Progress Notes (Signed)
Occupational Therapy Session Note  Patient Details  Name: Keith Guzman MRN: [id number] Date of Birth: June 20, 1961  Today's Date: 07/28/2017 OT Individual Time: 0321-2248 OT Individual Time Calculation (min): 30 min    Short Term Goals: Week 2:  OT Short Term Goal 1 (Week 2): STGs equal to LTGs set at modified independent to supervision.    Skilled Therapeutic Interventions/Progress Updates:    Pt completed toilet transfers squat pivot to the [facility] over the toilet with supervision.  Min guard assist for managing clothing sit to stand.  Discussed option of performing lateral leans side to side for clothing management at home vs sit to stand with RW, as pt will be alone for periods of time and will need to be modified independent.  Finished session with transfer back to the wheelchair and then back to the bed with supervision.  Pt left in sitting working on drinking beverage with call button and phone in reach.    Therapy Documentation Precautions:  Precautions Precautions: Fall Precaution Comments:   Restrictions Weight Bearing Restrictions: Yes LLE Weight Bearing: Non weight bearing Other Position/Activity Restrictions: L BKA  Pain: Pain Assessment Pain Assessment: No/denies pain ADL: See Function Navigator for Current Functional Status.   Therapy/Group: Individual Therapy  [name] OTR/L 07/28/2017, 3:46 PM

## 2017-07-28 NOTE — Progress Notes (Signed)
Social Work Kaiana Marion, Elveria Rising Social Worker Signed Physical Medicine and Rehabilitation  Patient Care Conference Date of Service: 07/28/2017  3:12 PM      Hide copied text Hover for attribution information Inpatient RehabilitationTeam Conference and Plan of Care Update Date: 07/28/2017   Time: 2:00 PM      Patient Name: Keith Guzman      Medical Record Number: [id number]  Date of Birth: [birth year] Sex: Male         Room/Bed: 4M11C/4M11C-01 Payor Info: Payor: MEDICARE / Plan: MEDICARE PART A AND B / Product Type: *No Product type* /     Admitting Diagnosis: Osteomyelitis foot  Admit Date/Time:  07/20/2017  4:21 PM Admission Comments: No comment available    Primary Diagnosis:  <principal problem not specified> Principal Problem: <principal problem not specified>       Patient Active Problem List    Diagnosis Date Noted  . AKI (acute kidney injury) (HCC)    . CKD (chronic kidney disease) stage 3, GFR 30-59 ml/min (HCC)    . Hypoglycemia due to type 2 diabetes mellitus (HCC)    . Unilateral complete BKA, left, sequela (HCC)    . Type 2 diabetes mellitus with peripheral neuropathy (HCC)    . Hypoalbuminemia due to protein-calorie malnutrition (HCC)    . Amputation of left lower extremity below knee (HCC) 07/20/2017  . S/P unilateral BKA (below knee amputation), left (HCC)    . Chronic combined systolic and diastolic congestive heart failure (HCC)    . Stage 3 chronic kidney disease (HCC)    . Diabetes mellitus type 2 in nonobese (HCC)    . Benign essential HTN    . Acute blood loss anemia    . Post-operative pain    . S/P below knee amputation, left (HCC) 07/16/2017  . Subacute osteomyelitis, left ankle and foot (HCC) 07/13/2017  . S/P transmetatarsal amputation of foot, left (HCC) 09/03/2016  . Anemia of chronic disease 10/17/2014  . Congestive dilated cardiomyopathy (HCC) 12/11/2013  . Hyperlipidemia 12/11/2013  . Bruit 12/11/2013  . Chronic systolic heart failure  (HCC) 40/98/1191  . Bilateral pleural effusion 10/16/2013  . Unspecified constipation 01/02/2013  . Constipation 12/11/2012  . Sepsis, gangrene Lt great toe 12/07/2012  . Skin ulcer of left lower leg (HCC) 12/07/2012  . Osteomyelitis of ankle, left foot and ankle- MRI 12/07/12 12/07/2012  . PVD, LSFA PTA 11/29/12 11/30/2012  . Non-healing ulcer of foot, left, limited to breakdown of skin (HCC) 11/30/2012  . HTN (hypertension), poor control 11/30/2012  . History of smoking. quit 2005 11/30/2012  . Sinus tachycardia 11/30/2012  . Uncontrolled type 2 diabetes mellitus with complication (HCC) 11/25/2006  . ANEMIA, microcytic- Hgb down to 6.7 12/07/12- transfused 11/25/2006      Expected Discharge Date: Expected Discharge Date: 07/31/17   Team Members Present: Physician leading conference: Dr. Maryla Morrow Social Worker Present: Dossie Der, LCSW Nurse Present: Allayne Stack, RN PT Present: Woodfin Ganja, PT OT Present: Perrin Maltese, OT SLP Present: Jackalyn Lombard, SLP PPS Coordinator present : Tora Duck, RN, CRRN       Current Status/Progress Goal Weekly Team Focus  Medical     Decreased functional mobility secondary to left BKA 07/16/2017  Improve mobility, safety, transfers, HTN/DM, AKI  See above   Bowel/Bladder     mostly cont of Bowel /cont of Bladder; LBM 10/30; constipation  toilet q3hr while awake/use urinal  assess for continued constipation and treat accordingly; offer toileting q3hr while  awake; assist with urinal use if needed   Swallow/Nutrition/ Hydration               ADL's     supervision for UB selfcare, min assist for LB selfcare, min assist for stand pivot transfers with use of the RW to the toilet or shower seat  supervision to modified independent   selfcare retraining, transfer training, balance retraining, UE exercise, pt/family education, DME education   Mobility     supervision squat pivot transfers, min assist for stand pivot, min assist hopping with RW,  supervision w/c mobility, poor participation  supervision gait, Mod I w/c mobility and transfers  activity tolerance, participation, transfers, standing balance, L LE ROM/strengthening   Communication               Safety/Cognition/ Behavioral Observations             Pain     currently denies abd pain; some discomfort  <3  assess for pain q shift and prn   Skin     abrasions to RLE; LLE BKA (ACE wrap); stump shrinker in room; staples to BKA site  skin free from infection and breakdown  assess skin q shift and prn     *See Care Plan and progress notes for long and short-term goals.      Barriers to Discharge   Current Status/Progress Possible Resolutions Date Resolved   Physician     Decreased caregiver support;Medical stability;Wound Care     See above  Therapies, follow labs, otpimize DM/HTN meds, bowel meds, IVF      Nursing                 PT  Behavior  poor participation               OT                 SLP            SW              Discharge Planning/Teaching Needs:  HOme with wife who works, therefore pt needs to be mod/i level by discharge. Bowel issues have limited him and he has refused therapies as a result      Team Discussion:  Progressing toward his goals. Family education completed with wife yesterday. Pt bowel are moving and he is feeling better. BP better controlled and receiving IV fluids at night for 3 days for AKI. Stump shrinker received today. Will be ready for DC Sat  Revisions to Treatment Plan:  DC 11/3    Continued Need for Acute Rehabilitation Level of Care: The patient requires daily medical management by a physician with specialized training in physical medicine and rehabilitation for the following conditions: Daily direction of a multidisciplinary physical rehabilitation program to ensure safe treatment while eliciting the highest outcome that is of practical value to the patient.: Yes Daily medical management of patient stability for increased  activity during participation in an intensive rehabilitation regime.: Yes Daily analysis of laboratory values and/or radiology reports with any subsequent need for medication adjustment of medical intervention for : Post surgical problems;Wound care problems;Diabetes problems;Blood pressure problems;Renal problems   Ansley Mangiapane, Lemar Livings 07/28/2017, 3:12 PM          Abbigale Mcelhaney, Lemar Livings, LCSW Social Worker Signed Physical Medicine and Rehabilitation  Patient Care Conference Date of Service: 07/21/2017  2:54 PM      Hide copied text Hover for attribution information  Inpatient RehabilitationTeam Conference and Plan of Care Update Date: 07/21/2017   Time: 2:25 PM      Patient Name: Keith Guzman      Medical Record Number: [id number]  Date of Birth: 14-Dec-1960 Sex: Male         Room/Bed: 4M11C/4M11C-01 Payor Info: Payor: MEDICARE / Plan: MEDICARE PART A AND B / Product Type: *No Product type* /     Admitting Diagnosis: Osteomyelitis foot  Admit Date/Time:  07/20/2017  4:21 PM Admission Comments: No comment available    Primary Diagnosis:  <principal problem not specified> Principal Problem: <principal problem not specified>       Patient Active Problem List    Diagnosis Date Noted  . Unilateral complete BKA, left, sequela (HCC)    . Type 2 diabetes mellitus with peripheral neuropathy (HCC)    . Hypoalbuminemia due to protein-calorie malnutrition (HCC)    . Amputation of left lower extremity below knee (HCC) 07/20/2017  . S/P unilateral BKA (below knee amputation), left (HCC)    . Chronic combined systolic and diastolic congestive heart failure (HCC)    . Stage 3 chronic kidney disease (HCC)    . Diabetes mellitus type 2 in nonobese (HCC)    . Benign essential HTN    . Acute blood loss anemia    . Post-operative pain    . S/P below knee amputation, left (HCC) 07/16/2017  . Subacute osteomyelitis, left ankle and foot (HCC) 07/13/2017  . S/P transmetatarsal amputation of foot, left  (HCC) 09/03/2016  . Anemia of chronic disease 10/17/2014  . Congestive dilated cardiomyopathy (HCC) 12/11/2013  . Hyperlipidemia 12/11/2013  . Bruit 12/11/2013  . Chronic systolic heart failure (HCC) 11/13/2013  . Bilateral pleural effusion 10/16/2013  . Unspecified constipation 01/02/2013  . Constipation 12/11/2012  . Sepsis, gangrene Lt great toe 12/07/2012  . Skin ulcer of left lower leg (HCC) 12/07/2012  . Osteomyelitis of ankle, left foot and ankle- MRI 12/07/12 12/07/2012  . PVD, LSFA PTA 11/29/12 11/30/2012  . Non-healing ulcer of foot, left, limited to breakdown of skin (HCC) 11/30/2012  . HTN (hypertension), poor control 11/30/2012  . History of smoking. quit 2005 11/30/2012  . Sinus tachycardia 11/30/2012  . Uncontrolled type 2 diabetes mellitus with complication (HCC) 11/25/2006  . ANEMIA, microcytic- Hgb down to 6.7 12/07/12- transfused 11/25/2006      Expected Discharge Date: Expected Discharge Date: 07/31/17   Team Members Present: Physician leading conference: Dr. Maryla Morrow Social Worker Present: Dossie Der, LCSW Nurse Present: Chrissie Noa, RN PT Present: Woodfin Ganja, PT OT Present: Perrin Maltese, OT SLP Present: Jackalyn Lombard, SLP PPS Coordinator present : Tora Duck, RN, CRRN       Current Status/Progress Goal Weekly Team Focus  Medical     Decreased functional mobility secondary to left BKA 07/16/2017  Improve mobility, safety, transfers  See above   Bowel/Bladder          cont B & B     Swallow/Nutrition/ Hydration               ADL's     Supervision for all UB selfcare and min assist for LB selfcare.  Min assist for stand pivot transfers  supervision to modified independent   selfcare retraining, transfer training, balance retraining, UE exercise, pt/family education, DME education   Mobility     min assist for transfers and gait up to 15 ft, supervision w/c mobility  supervision gait, Mod I w/c mobility and transfers  activity tolerance,  standing balance, LE strengthening/ROM, gait   Communication               Safety/Cognition/ Behavioral Observations             Pain          MD managing pain from amputation     Skin          Wound vac-monitor skin       *See Care Plan and progress notes for long and short-term goals.      Barriers to Discharge   Current Status/Progress Possible Resolutions Date Resolved   Physician     Decreased caregiver support;Medical stability;Wound Care     See above  Therapies, follow labs, otpimize DM/HTN meds      Nursing                 PT  Decreased caregiver support;Home environment access/layout                 OT Decreased caregiver support  Spouse and son both work             SLP            SW Decreased caregiver support Does not have 24 hr care-wife works              Discharge Planning/Teaching Needs:    Home with wife who work nights and sleeps days. Son works two jobs. Pt will need to be mod/i to return home.     Team Discussion:  Goals supervision-mod/i wheelchair level. New eval today. MD monitoring diabetes and CKD adjusting meds. Poor endurance and fatigues easily. Wound Vac for seven days post op.  Revisions to Treatment Plan:  New eval target DC 11/3    Continued Need for Acute Rehabilitation Level of Care: The patient requires daily medical management by a physician with specialized training in physical medicine and rehabilitation for the following conditions: Daily direction of a multidisciplinary physical rehabilitation program to ensure safe treatment while eliciting the highest outcome that is of practical value to the patient.: Yes Daily medical management of patient stability for increased activity during participation in an intensive rehabilitation regime.: Yes Daily analysis of laboratory values and/or radiology reports with any subsequent need for medication adjustment of medical intervention for : Post surgical problems;Wound care problems;Diabetes  problems;Blood pressure problems;Renal problems   Lucy ChrisDupree, Gelene Recktenwald G 07/22/2017, 8:17 AM           Patient ID: Keith Guzman, male   DOB: [birth year], 58 y.o.   MRN: [id number]

## 2017-07-28 NOTE — Patient Care Conference (Signed)
Inpatient RehabilitationTeam Conference and Plan of Care Update Date: 07/28/2017   Time: 2:00 PM    Patient Name: Keith Guzman      Medical Record Number: [id number]  Date of Birth: March 17, 1961 Sex: Male         Room/Bed: 4M11C/4M11C-01 Payor Info: Payor: MEDICARE / Plan: MEDICARE PART A AND B / Product Type: *No Product type* /    Admitting Diagnosis: Osteomyelitis foot  Admit Date/Time:  07/20/2017  4:21 PM Admission Comments: No comment available   Primary Diagnosis:  <principal problem not specified> Principal Problem: <principal problem not specified>  Patient Active Problem List   Diagnosis Date Noted  . AKI (acute kidney injury) (HCC)   . CKD (chronic kidney disease) stage 3, GFR 30-59 ml/min (HCC)   . Hypoglycemia due to type 2 diabetes mellitus (HCC)   . Unilateral complete BKA, left, sequela (HCC)   . Type 2 diabetes mellitus with peripheral neuropathy (HCC)   . Hypoalbuminemia due to protein-calorie malnutrition (HCC)   . Amputation of left lower extremity below knee (HCC) 07/20/2017  . S/P unilateral BKA (below knee amputation), left (HCC)   . Chronic combined systolic and diastolic congestive heart failure (HCC)   . Stage 3 chronic kidney disease (HCC)   . Diabetes mellitus type 2 in nonobese (HCC)   . Benign essential HTN   . Acute blood loss anemia   . Post-operative pain   . S/P below knee amputation, left (HCC) 07/16/2017  . Subacute osteomyelitis, left ankle and foot (HCC) 07/13/2017  . S/P transmetatarsal amputation of foot, left (HCC) 09/03/2016  . Anemia of chronic disease 10/17/2014  . Congestive dilated cardiomyopathy (HCC) 12/11/2013  . Hyperlipidemia 12/11/2013  . Bruit 12/11/2013  . Chronic systolic heart failure (HCC) 11/13/2013  . Bilateral pleural effusion 10/16/2013  . Unspecified constipation 01/02/2013  . Constipation 12/11/2012  . Sepsis, gangrene Lt great toe 12/07/2012  . Skin ulcer of left lower leg (HCC) 12/07/2012  . Osteomyelitis of  ankle, left foot and ankle- MRI 12/07/12 12/07/2012  . PVD, LSFA PTA 11/29/12 11/30/2012  . Non-healing ulcer of foot, left, limited to breakdown of skin (HCC) 11/30/2012  . HTN (hypertension), poor control 11/30/2012  . History of smoking. quit 2005 11/30/2012  . Sinus tachycardia 11/30/2012  . Uncontrolled type 2 diabetes mellitus with complication (HCC) 11/25/2006  . ANEMIA, microcytic- Hgb down to 6.7 12/07/12- transfused 11/25/2006    Expected Discharge Date: Expected Discharge Date: 07/31/17  Team Members Present: Physician leading conference: Dr. Maryla Morrow Social Worker Present: Dossie Der, LCSW Nurse Present: Allayne Stack, RN PT Present: Woodfin Ganja, PT OT Present: Perrin Maltese, OT SLP Present: Jackalyn Lombard, SLP PPS Coordinator present : Tora Duck, RN, CRRN     Current Status/Progress Goal Weekly Team Focus  Medical   Decreased functional mobility secondary to left BKA 07/16/2017  Improve mobility, safety, transfers, HTN/DM, AKI  See above   Bowel/Bladder   mostly cont of Bowel /cont of Bladder; LBM 10/30; constipation  toilet q3hr while awake/use urinal  assess for continued constipation and treat accordingly; offer toileting q3hr while awake; assist with urinal use if needed   Swallow/Nutrition/ Hydration             ADL's   supervision for UB selfcare, min assist for LB selfcare, min assist for stand pivot transfers with use of the RW to the toilet or shower seat  supervision to modified independent   selfcare retraining, transfer training, balance retraining, UE exercise, pt/family  education, DME education   Mobility   supervision squat pivot transfers, min assist for stand pivot, min assist hopping with RW, supervision w/c mobility, poor participation  supervision gait, Mod I w/c mobility and transfers  activity tolerance, participation, transfers, standing balance, L LE ROM/strengthening   Communication             Safety/Cognition/ Behavioral  Observations            Pain   currently denies abd pain; some discomfort  <3  assess for pain q shift and prn   Skin   abrasions to RLE; LLE BKA (ACE wrap); stump shrinker in room; staples to BKA site  skin free from infection and breakdown  assess skin q shift and prn      *See Care Plan and progress notes for long and short-term goals.     Barriers to Discharge  Current Status/Progress Possible Resolutions Date Resolved   Physician    Decreased caregiver support;Medical stability;Wound Care     See above  Therapies, follow labs, otpimize DM/HTN meds, bowel meds, IVF      Nursing                  PT  Behavior  poor participation               OT                  SLP                SW                Discharge Planning/Teaching Needs:  HOme with wife who works, therefore pt needs to be mod/i level by discharge. Bowel issues have limited him and he has refused therapies as a result      Team Discussion:  Progressing toward his goals. Family education completed with wife yesterday. Pt bowel are moving and he is feeling better. BP better controlled and receiving IV fluids at night for 3 days for AKI. Stump shrinker received today. Will be ready for DC Sat  Revisions to Treatment Plan:  DC 11/3    Continued Need for Acute Rehabilitation Level of Care: The patient requires daily medical management by a physician with specialized training in physical medicine and rehabilitation for the following conditions: Daily direction of a multidisciplinary physical rehabilitation program to ensure safe treatment while eliciting the highest outcome that is of practical value to the patient.: Yes Daily medical management of patient stability for increased activity during participation in an intensive rehabilitation regime.: Yes Daily analysis of laboratory values and/or radiology reports with any subsequent need for medication adjustment of medical intervention for : Post surgical  problems;Wound care problems;Diabetes problems;Blood pressure problems;Renal problems  Sylvana Bonk, Lemar LivingsRebecca G 07/28/2017, 3:12 PM

## 2017-07-28 NOTE — Progress Notes (Signed)
Physical Therapy Session Note  Patient Details  Name: Keith Guzman MRN: [id number] Date of Birth: 03-08-1961  Today's Date: 07/28/2017 PT Individual Time: 1030-1115, 1300-1400  PT Individual Time Calculation (min): 45 min , 60 min  Short Term Goals: Week 1:  PT Short Term Goal 1 (Week 1): STG=LTG due to ELOS  Skilled Therapeutic Interventions/Progress Updates:    Session 1: Pt seated in w/c upon PT arrival agreeable to therapy tx and reports pain 8/10 in L distal limb, already received pain meds, relieved with rest. Pt propelled w/c from room<>gym x 150 ft each direction with supervision and increased time to completed secondary to decreased UE propulsion speed. Pt seated in w/c performed therex: 2 x 10 LAQ, 2 x 10 hip flexion, 2 x 10 hip abduction, 2 x 10 quad sets. Therapist recommended working on standing activities including balance, ambulation and strengthening, however pt declined any standing activities this session reporting "I can't do that, standing will pull on my bowels." Pt performed seated therex for upper body strengthening: 2 x 10 shoulder flexion, 2 x 10 bicep curls, 2 x 10 shoulder press, 2 x 10 chest press, all using 3# dowel. Pt left seated in w/c at end of session with needs in reach.   Session 2: Pt seated in w/c upon PT arrival, agreeable to therapy tx and denies pain this session. Pt propelled w/c from room<>dayroom x 250 ft each way with increased time to complete, using B UEs. Pt participated in fall festival activities, interacting with peers and working on activity tolerance, although still refusing all standing activities. Pt seated in w/c worked on w/c propulsion navigating around obstacles and navigating a busy environment. Worked on dynamic seated balance throwing bean bags to participate in corn hole activity. Worked on activity tolerance and community reintegration in order to pain pumpkins with peers. Pt left seated in w/c at end of session with needs in reach.    Therapy Documentation Precautions:  Precautions Precautions: Fall Precaution Comments: Wound vac Restrictions Weight Bearing Restrictions: Yes LLE Weight Bearing: Non weight bearing Other Position/Activity Restrictions: L BKA   See Function Navigator for Current Functional Status.   Therapy/Group: Individual Therapy  Cresenciano Genre, PT, DPT 07/28/2017, 10:28 AM

## 2017-07-28 NOTE — Progress Notes (Signed)
Occupational Therapy Session Note  Patient Details  Name: MASUD WILKINS MRN: [id number] Date of Birth: 02/27/1961  Today's Date: 07/28/2017 OT Individual Time: 0902-1000 OT Individual Time Calculation (min): 58 min    Short Term Goals: Week 2:  OT Short Term Goal 1 (Week 2): STGs equal to LTGs set at modified independent to supervision.    Skilled Therapeutic Interventions/Progress Updates:    Pt completed transfer to the wheelchair with supervision squat pivot.  He then worked on bathing and dressing sit to stand at the sink.  Min guard assist with min instructional cueing to complete bathing and dressing.  He required min instructional cueing to start dressing the RLE first as he had trouble donning brief when he attempted the left residual limb first.  Once dressing was completed took pt down to the day room where he completed one set of 5 mins on the UE ergonometer set at level 10.  RPMs maintained at 16-20.  Finished session with return back to the room and pt placed at bedside with call button and phone in reach.   Therapy Documentation Precautions:  Precautions Precautions: Fall Precaution Comments: Wound vac Restrictions Weight Bearing Restrictions: Yes LLE Weight Bearing: Non weight bearing Other Position/Activity Restrictions: L BKA  Pain: Pain Assessment Pain Assessment: Faces Faces Pain Scale: Hurts a little bit Pain Type: Acute pain Pain Location: Generalized Pain Descriptors / Indicators: Discomfort Pain Intervention(s): Medication (See eMAR);RN made aware;Repositioned;Emotional support ADL: See Function Navigator for Current Functional Status.   Therapy/Group: Individual Therapy  Greenleigh Kauth OTR/L 07/28/2017, 10:23 AM

## 2017-07-28 NOTE — Progress Notes (Signed)
[place] PHYSICAL MEDICINE & REHABILITATION     PROGRESS NOTE  Subjective/Complaints:  Pt seen laying in bed this AM. He states he slept well overnight. He states he had a bowel movement yesterday and feels better this morning. Yesterday, informed by nursing that patient clumps of stool scattered throughout his room. When asked patient about this, he states he is not sure why that happened, "I don't know if I was sleeping or not".  ROS: Denies nausea, vomiting, diarrhea, shortness of breath or chest pain   Objective: Vital Signs: Blood pressure 126/70, pulse 91, temperature 98.9 F (37.2 C), temperature source Oral, resp. rate 18, height 5\' 11"  (1.803 m), weight 74.8 kg (165 lb), SpO2 100 %. Dg Abd 1 View  Result Date: 07/27/2017 CLINICAL DATA:  Constipation and abdominal pain.  Nausea. EXAM: ABDOMEN - 1 VIEW COMPARISON:  None. FINDINGS: Moderate stool in the ascending and transverse colon, with small volume of stool distally. Small volume of rectal stool. No small bowel dilatation. No evidence of free air on single supine view. No radiopaque calculi. Visualized osseous structures are intact. IMPRESSION: Moderate colonic stool burden without bowel obstruction. Electronically Signed   By: Rubye Oaks M.D.   On: 07/27/2017 04:10    Recent Labs  07/26/17 0710  WBC 7.2  HGB 8.8*  HCT 29.4*  PLT 387    Recent Labs  07/26/17 0710 07/28/17 0507  NA 139 139  K 4.0 3.8  CL 106 104  GLUCOSE 84 108*  BUN 28* 36*  CREATININE 1.97* 2.38*  CALCIUM 8.4* 8.9   CBG (last 3)   Recent Labs  07/27/17 1642 07/27/17 2117 07/28/17 0652  GLUCAP 119* 134* 91    Wt Readings from Last 3 Encounters:  07/22/17 74.8 kg (165 lb)  07/16/17 74.8 kg (165 lb)  05/06/17 73 kg (161 lb)    Physical Exam:  BP 126/70 (BP Location: Right Arm)   Pulse 91   Temp 98.9 F (37.2 C) (Oral)   Resp 18   Ht 5\' 11"  (1.803 m)   Wt 74.8 kg (165 lb)   SpO2 100%   BMI 23.01 kg/m  Constitutional: He  appears well-developed. No distress.  HENT: Normocephalicand atraumatic.  Eyes: EOMare normal. No discharge.  Cardiovascular: RRR. No JVD. Respiratory: CTA Bilaterally. Normal effort. GI: Bowel sounds are normal. He exhibits no distension.  Neurological: He is alertand oriented.  Motor: B/L UE 5/5 prox to distal.  RLE: 4+/5 proximal to distal LLE: HF, KE 4+/5 (stable)  Skin: left BK stump with staples clean, dry, intact Psych: Easily agitated  Assessment/Plan: 1. Functional deficits secondary to left BKA which require 3+ hours per day of interdisciplinary therapy in a comprehensive inpatient rehab setting. Physiatrist is providing close team supervision and 24 hour management of active medical problems listed below. Physiatrist and rehab team continue to assess barriers to discharge/monitor patient progress toward functional and medical goals.  Function:  Bathing Bathing position   Position: Shower  Bathing parts Body parts bathed by patient: Right arm, Left arm, Chest, Abdomen, Front perineal area, Buttocks, Right upper leg, Left upper leg, Right lower leg Body parts bathed by helper: Back  Bathing assist Assist Level: Supervision or verbal cues      Upper Body Dressing/Undressing Upper body dressing   What is the patient wearing?: Pull over shirt/dress     Pull over shirt/dress - Perfomed by patient: Thread/unthread right sleeve, Thread/unthread left sleeve, Put head through opening, Pull shirt over trunk  Upper body assist Assist Level: Supervision or verbal cues      Lower Body Dressing/Undressing Lower body dressing   What is the patient wearing?: Pants     Pants- Performed by patient: Thread/unthread right pants leg, Thread/unthread left pants leg, Pull pants up/down, Fasten/unfasten pants   Non-skid slipper socks- Performed by patient: Don/doff right sock                    Lower body assist Assist for lower body dressing: Supervision or  verbal cues      Toileting Toileting   Toileting steps completed by patient: Adjust clothing prior to toileting, Performs perineal hygiene, Adjust clothing after toileting Toileting steps completed by helper: Adjust clothing prior to toileting, Adjust clothing after toileting (per Epimenio FootErin Neal, NT assist with clothing) Toileting Assistive Devices: Grab bar or rail  Toileting assist Assist level: Touching or steadying assistance (Pt.75%)   Transfers Chair/bed transfer   Chair/bed transfer method: Squat pivot Chair/bed transfer assist level: Supervision or verbal cues Chair/bed transfer assistive device: Armrests     Locomotion Ambulation     Max distance: 45 ft Assist level: Touching or steadying assistance (Pt > 75%)   Wheelchair   Type: Manual Max wheelchair distance: >150' Assist Level: Supervision or verbal cues  Cognition Comprehension Comprehension assist level: Follows basic conversation/direction with no assist  Expression Expression assist level: Expresses basic needs/ideas: With no assist  Social Interaction Social Interaction assist level: Interacts appropriately with others with medication or extra time (anti-anxiety, antidepressant).  Problem Solving Problem solving assist level: Solves basic problems with no assist  Memory Memory assist level: Recognizes or recalls 75 - 89% of the time/requires cueing 10 - 24% of the time    Medical Problem List and Plan: 1. Decreased functional mobilitysecondary to left BKA 07/16/2017  Continue CIR  Stump shrinker ordered 2. DVT Prophylaxis/Anticoagulation: SCD 3. Pain Management: Robaxin and oxycodone as needed 4. Mood: Provide emotional support 5. Neuropsych: This patient iscapable of making decisions on hisown behalf. 6. Skin/Wound Care: Routine skin checks  Removed vac Saturday without any issues 7. Fluids/Electrolytes/Nutrition: Routine I&O 8.Acute on Chronic anemia. Patient receives Aranesp every 30 days.   Hb 8.8  on 10/29  Cont to monitor 9.Diabetes mellitus and peripheral neuropathy. Hemoglobin A1c 7.5. Glucotrol 10 mg daily, NovoLog 3 units TID with meals.   Check blood sugars before meals and at bedtime. Diabetic teaching  Lantus insulin 15 units daily at bedtime, decreased to 10 units on 10/26  Improving control overall on 10/31 10.CKD stage III with AKI.   Cr.  2.38 on 10/31   Encourage fluids  IVF ordered qhs x3 nights  Cont to monitor 11.Hypertension.   Coreg 25 mg twice a day.  Norvasc 5 started on 10/25, changed to 2.5 BID on 10/30 for better consistent control  Improving on 10/31 12.Hyperlipidemia. Lipitor 13. Hypoalbuminemia  Supplement initiated 10/24 14. Constipation  KUB reviewed suggesting constipation  Bowel regimen increased on 10/30  Improving  LOS (Days) 8 A FACE TO FACE EVALUATION WAS PERFORMED  Kaspar Albornoz Karis Jubanil Romie Keeble 07/28/2017 8:23 AM

## 2017-07-28 NOTE — Progress Notes (Signed)
Orthopedic Tech Progress Note Patient Details:  Keith Guzman May 12, 1961 [id number]  Patient ID: [name], male   DOB: 18-Oct-1960, 56 y.o.   MRN: [id number]   [name] 07/28/2017, 9:09 AM Called in bio-tech brace order; spoke with Wylene Men

## 2017-07-29 ENCOUNTER — Inpatient Hospital Stay (HOSPITAL_COMMUNITY): Payer: 59 | Admitting: Occupational Therapy

## 2017-07-29 ENCOUNTER — Inpatient Hospital Stay (HOSPITAL_COMMUNITY): Payer: 59 | Admitting: Physical Therapy

## 2017-07-29 LAB — GLUCOSE, CAPILLARY
Glucose-Capillary: 122 mg/dL — ABNORMAL HIGH (ref 65–99)
Glucose-Capillary: 85 mg/dL (ref 65–99)
Glucose-Capillary: 105 mg/dL — ABNORMAL HIGH (ref 65–99)
Glucose-Capillary: 135 mg/dL — ABNORMAL HIGH (ref 65–99)

## 2017-07-29 NOTE — Discharge Instructions (Signed)
Inpatient Rehab Discharge Instructions  [facility] Discharge date and time: No discharge date for patient encounter.   Activities/Precautions/ Functional Status: Activity: activity as tolerated Diet: diabetic diet Wound Care: keep wound clean and dry Functional status:  ___ No restrictions     ___ Walk up steps independently ___ 24/7 supervision/assistance   ___ Walk up steps with assistance ___ Intermittent supervision/assistance  ___ Bathe/dress independently ___ Walk with walker     _x__ Bathe/dress with assistance ___ Walk Independently    ___ Shower independently ___ Walk with assistance    ___ Shower with assistance ___ No alcohol     ___ Return to work/school ________  Special Instructions:   COMMUNITY REFERRALS UPON DISCHARGE:    Home Health:   PT, OT, RN  Agency:[facility] Phone:[phone]   Date of last service:07/31/2017  Medical Equipment/Items Ordered:WHEELCHAIR, Levan Hurst, 3 IN 1  Agency/Supplier:[facility]  [phone]   GENERAL COMMUNITY RESOURCES FOR PATIENT/FAMILY: Support Groups:[facility] SUPPORT GROUP EVERY SECOND Thursday @ 7:30-8:30 PM @ [facility] EDUCATION CENTER QUESTIONS CONTACT ROBIN [phone]   My questions have been answered and I understand these instructions. I will adhere to these goals and the provided educational materials after my discharge from the hospital.  Patient/Caregiver Signature _______________________________ Date __________  Clinician Signature _______________________________________ Date __________  Please bring this form and your medication list with you to all your follow-up doctor's appointments.

## 2017-07-29 NOTE — Progress Notes (Signed)
Physical Therapy Session Note  Patient Details  Name: GLENNIS MONTENEGRO MRN: [id number] Date of Birth: 12/22/1960  Today's Date: 07/29/2017 PT Individual Time: 2482-5003 AND 1415-1505 PT Individual Time Calculation (min): 50 min AND 50 min  and Today's Date: 07/29/2017 PT Missed Time: 10 Minutes Missed Time Reason: Patient unwilling to participate (unwilling to participate last 15 min of morning session)  Short Term Goals: Week 1:  PT Short Term Goal 1 (Week 1): STG=LTG due to ELOS  Skilled Therapeutic Interventions/Progress Updates:   Session 1: Pt sitting EOB upon arrival and agreeable to therapy, no c/o pain. Pt asked to finish getting ready for the day at sink. He transferred to w/c w/ supervision and self-propelled w/c around room and to/from sink w/ supervision and independently used appropriate w/c functions to lock, unlock, etc. Set-up assist for sink ADLs. Pt expressed that he didn't understand why he was still in hospital or still receiving therapy as he believes he is strong enough and moving around well enough to go home. Discussed pt's medical concerns that MD has expressed to pt and medical team, and educated pt on using 1 more day of therapy to practice performing mobility in home-like environment. Pt understood but refusing to leave room for session. Ambulated around room w/ supervision w/ emphasis on turning in tight spaces w/ RW and safe management of RW. Pt declining to work on strengthening exercises in room. Ended session in w/c, clal bell within reach and all needs met.   Session 2:  Pt sitting in w/c upon arrival and agreeable to therapy, no c/o pain. Worked on functional activity and endurance this session. Pt self-propelled w/c to therapy gym w/ supervision using BUEs and practiced going up/down 1" and 3" steps in parallel bars w/ Min guard. Verbal cues for using UE strength to assist LE. Attempted to go up steps in gym, however pt unable to lift foot from ground. Transported  Total A in w/c to day room to ambulate on carpet to mimic home environment. Pt ambulated 56' w/ supervision using RW. Got to point of exhaustion and had to get w/c for seated rest break. Pt self-propelled w/c back to room w/ supervision. Ended session in w/c,call bell within reach and all needs met. Made up 5 minutes from missed time in morning.   Therapy Documentation Precautions:  Precautions Precautions: Fall Precaution Comments:   Restrictions Weight Bearing Restrictions: Yes LLE Weight Bearing: Non weight bearing Other Position/Activity Restrictions: L BKA Pain: Pain Assessment Pain Assessment: Faces Faces Pain Scale: Hurts a little bit Pain Type: Surgical pain Pain Location: Leg Pain Orientation: Left Pain Descriptors / Indicators: Discomfort Pain Intervention(s): Medication (See eMAR);Repositioned  See Function Navigator for Current Functional Status.   Therapy/Group: Individual Therapy  Aamna Mallozzi K Arnette 07/29/2017, 3:22 PM

## 2017-07-29 NOTE — Progress Notes (Signed)
Occupational Therapy Session Note  Patient Details  Name: Keith Guzman MRN: [id number] Date of Birth: 21-Dec-1960  Today's Date: 07/29/2017 OT Individual Time: 7209-4709 OT Individual Time Calculation (min): 28 min    Short Term Goals: Week 2:  OT Short Term Goal 1 (Week 2): STGs equal to LTGs set at modified independent to supervision.    Skilled Therapeutic Interventions/Progress Updates:    Pt completed transfer squat pivot from bed to wheelchair with supervision.  Therapist then transferred him down to the ADL kitchen where he worked on retrieving items from the refrigerator and transporting them with use of the countertops and the wheelchair.  Discussed preparing food and transporting it when it's hot.  Pt reports that he will let the food cool down before transporting it to the table to eat.  Also had him practice squat pivot transfers to the couch with supervision as well as to the toilet stand pivot with supervision using the RW.  Discussed that pt will need to use the walker to stand pivot as the wheelchair will not fit in the bathroom.  Talked about moving the [facility] outside of the bathroom as well so that he can complete squat pivot to the 3:1 and then perform clothing management in sitting.    Therapy Documentation Precautions:  Precautions Precautions: Fall Precaution Comments:   Restrictions Weight Bearing Restrictions: No LLE Weight Bearing: Non weight bearing Other Position/Activity Restrictions: L BKA  Pain: Pain Assessment Pain Assessment: No/denies pain Faces Pain Scale: Hurts a little bit ADL: See Function Navigator for Current Functional Status.   Therapy/Group: Individual Therapy  Mael Delap OTR/L 07/29/2017, 3:33 PM

## 2017-07-29 NOTE — Progress Notes (Signed)
Social Work Patient ID: Keith Guzman, male   DOB: 11-Feb-1961, 56 y.o.   MRN: [id number]  Met with pt to update team conference goal being met and his need to participate in therapies. He is having bowel issues and does not feel like going at times. Aware he could be discharged sooner due to this and MD is aware of this. Once medically stable should discharge home. Wife has been trained in his care.

## 2017-07-29 NOTE — Progress Notes (Signed)
[place] PHYSICAL MEDICINE & REHABILITATION     PROGRESS NOTE  Subjective/Complaints:  Pt seen laying in bed this AM.  He states he had difficulty going back to sleep after waking up for a BM.    ROS: Denies nausea, vomiting, diarrhea, shortness of breath or chest pain   Objective: Vital Signs: Blood pressure (!) 149/77, pulse 89, temperature 98.9 F (37.2 C), temperature source Oral, resp. rate 18, height 5\' 11"  (1.803 m), weight 74.8 kg (165 lb), SpO2 100 %. No results found. No results for input(s): WBC, HGB, HCT, PLT in the last 72 hours.  Recent Labs  07/28/17 0507  NA 139  K 3.8  CL 104  GLUCOSE 108*  BUN 36*  CREATININE 2.38*  CALCIUM 8.9   CBG (last 3)   Recent Labs  07/28/17 1847 07/28/17 2110 07/29/17 0701  GLUCAP 108* 202* 122*    Wt Readings from Last 3 Encounters:  07/22/17 74.8 kg (165 lb)  07/16/17 74.8 kg (165 lb)  05/06/17 73 kg (161 lb)    Physical Exam:  BP (!) 149/77 (BP Location: Left Arm)   Pulse 89   Temp 98.9 F (37.2 C) (Oral)   Resp 18   Ht 5\' 11"  (1.803 m)   Wt 74.8 kg (165 lb)   SpO2 100%   BMI 23.01 kg/m  Constitutional: He appears well-developed. No distress.  HENT: Normocephalicand atraumatic.  Eyes: EOMare normal. No discharge.  Cardiovascular: RRR. No JVD. Respiratory: CTA Bilaterally. Normal effort. GI: Bowel sounds are normal. He exhibits no distension.  Neurological: He is alertand oriented.  Motor: B/L UE 5/5 prox to distal.  RLE: 4+/5 proximal to distal LLE: HF, KE 4+/5 (unchanged)  Skin: left BK stump with dressing clean, dry, intact Psych: Very flat.   Assessment/Plan: 1. Functional deficits secondary to left BKA which require 3+ hours per day of interdisciplinary therapy in a comprehensive inpatient rehab setting. Physiatrist is providing close team supervision and 24 hour management of active medical problems listed below. Physiatrist and rehab team continue to assess barriers to discharge/monitor  patient progress toward functional and medical goals.  Function:  Bathing Bathing position   Position: Wheelchair/chair at sink  Bathing parts Body parts bathed by patient: Right arm, Left arm, Chest, Abdomen, Front perineal area, Buttocks, Right upper leg, Left upper leg, Right lower leg, Left lower leg Body parts bathed by helper: Back  Bathing assist Assist Level: Supervision or verbal cues      Upper Body Dressing/Undressing Upper body dressing   What is the patient wearing?: Pull over shirt/dress     Pull over shirt/dress - Perfomed by patient: Thread/unthread right sleeve, Thread/unthread left sleeve, Put head through opening, Pull shirt over trunk          Upper body assist Assist Level: Supervision or verbal cues      Lower Body Dressing/Undressing Lower body dressing   What is the patient wearing?: Pants, Non-skid slipper socks     Pants- Performed by patient: Thread/unthread right pants leg, Thread/unthread left pants leg, Pull pants up/down, Fasten/unfasten pants   Non-skid slipper socks- Performed by patient: Don/doff right sock                    Lower body assist Assist for lower body dressing: Supervision or verbal cues      Toileting Toileting   Toileting steps completed by patient: Adjust clothing prior to toileting, Performs perineal hygiene, Adjust clothing after toileting Toileting steps completed by helper:  Adjust clothing prior to toileting, Adjust clothing after toileting (per Epimenio FootErin Neal, NT assist with clothing) Toileting Assistive Devices: Grab bar or rail  Toileting assist Assist level: Touching or steadying assistance (Pt.75%)   Transfers Chair/bed transfer   Chair/bed transfer method: Squat pivot Chair/bed transfer assist level: Supervision or verbal cues Chair/bed transfer assistive device: Armrests     Locomotion Ambulation Ambulation activity did not occur: Refused   Max distance: 45 ft Assist level: Touching or steadying  assistance (Pt > 75%)   Wheelchair   Type: Manual Max wheelchair distance: >150' Assist Level: Supervision or verbal cues  Cognition Comprehension Comprehension assist level: Follows basic conversation/direction with no assist  Expression Expression assist level: Expresses basic needs/ideas: With no assist  Social Interaction Social Interaction assist level: Interacts appropriately with others with medication or extra time (anti-anxiety, antidepressant).  Problem Solving Problem solving assist level: Solves basic problems with no assist  Memory Memory assist level: Recognizes or recalls 75 - 89% of the time/requires cueing 10 - 24% of the time    Medical Problem List and Plan: 1. Decreased functional mobilitysecondary to left BKA 07/16/2017  Continue CIR  Stump shrinker 2. DVT Prophylaxis/Anticoagulation: SCD 3. Pain Management: Robaxin and oxycodone as needed 4. Mood: Provide emotional support 5. Neuropsych: This patient iscapable of making decisions on hisown behalf. 6. Skin/Wound Care: Routine skin checks  Removed vac Saturday without any issues 7. Fluids/Electrolytes/Nutrition: Routine I&O 8.Acute on Chronic anemia. Patient receives Aranesp every 30 days.   Hb 8.8 on 10/29  Cont to monitor 9.Diabetes mellitus and peripheral neuropathy. Hemoglobin A1c 7.5. Glucotrol 10 mg daily, NovoLog 3 units TID with meals.   Check blood sugars before meals and at bedtime. Diabetic teaching  Lantus insulin 15 units daily at bedtime, decreased to 10 units on 10/26  Labile, but overall controlled 11/1 10.CKD stage III with AKI.   Cr.  2.38 on 10/31  Labs ordered for tomorrow   Encourage fluids  IVF ordered qhs x3 nights  Cont to monitor 11.Hypertension.   Coreg 25 mg twice a day.  Norvasc 5 started on 10/25, changed to 2.5 BID on 10/30 for better consistent control  Overall controlled 11/1 12.Hyperlipidemia. Lipitor 13. Hypoalbuminemia  Supplement initiated 10/24 14.  Constipation  KUB reviewed suggesting constipation  Bowel regimen increased on 10/30  Improving  LOS (Days) 9 A FACE TO FACE EVALUATION WAS PERFORMED  Marjorie Deprey Karis Jubanil Placido Hangartner 07/29/2017 8:41 AM

## 2017-07-29 NOTE — Progress Notes (Signed)
Occupational Therapy Session Note  Patient Details  Name: Keith Guzman MRN: [id number] Date of Birth: 02/17/1961  Today's Date: 07/29/2017 OT Individual Time: 1950-9326 OT Individual Time Calculation (min): 56 min    Short Term Goals: Week 2:  OT Short Term Goal 1 (Week 2): STGs equal to LTGs set at modified independent to supervision.    Skilled Therapeutic Interventions/Progress Updates:    Pt completed UB dressing from wheelchair to start session, with supervision.  Therapist obtained scrub top as he did not have any clean clothing.  Next pt rolled himself to the tub/shower room where therapist verbally educated him on need for tub/shower bench for use at home.  He then practiced tub/shower transfer with use of the RW and min guard assist.  Min instructional cueing for technique and hand placement.  Once completed had pt roll himself down to the dayroom for UE strengthening with use of the ergonometer.  He was able to complete 2 intervals of 5 mins each, one peddling forward and the other peddling backwards.  Resistance placed on level 12 for both sets and RPMs maintained at 20.  Returned to room at completion of exercise with pt pushing 75% of the way back.  One rest break needed secondary to fatigue.  Once back in room, pt was left in wheelchair at bedside with call button and phone in reach and NT present.    Therapy Documentation Precautions:  Precautions Precautions: Fall Precaution Comments:   Restrictions Weight Bearing Restrictions: Yes LLE Weight Bearing: Non weight bearing Other Position/Activity Restrictions: L BKA  Pain: Pain Assessment Pain Assessment: Faces Pain Score: 4  Faces Pain Scale: Hurts a little bit Pain Type: Surgical pain Pain Location: Leg Pain Orientation: Left Pain Descriptors / Indicators: Discomfort Pain Intervention(s): Medication (See eMAR);Repositioned ADL: See Function Navigator for Current Functional Status.   Therapy/Group: Individual  Therapy  Tierrah Anastos OTR/L 07/29/2017, 12:49 PM

## 2017-07-29 NOTE — Progress Notes (Signed)
2 continent stools tonight. Using urinal.  Flat affect. Denies pain thus far this shift. HS IVF's infusing. Alfredo Martinez A

## 2017-07-30 ENCOUNTER — Inpatient Hospital Stay (HOSPITAL_COMMUNITY): Payer: 59 | Admitting: Occupational Therapy

## 2017-07-30 ENCOUNTER — Inpatient Hospital Stay (HOSPITAL_COMMUNITY): Payer: 59 | Admitting: Physical Therapy

## 2017-07-30 LAB — BASIC METABOLIC PANEL
ANION GAP: 8 (ref 5–15)
BUN: 31 mg/dL — AB (ref 6–20)
CALCIUM: 8.4 mg/dL — AB (ref 8.9–10.3)
CO2: 23 mmol/L (ref 22–32)
Chloride: 108 mmol/L (ref 101–111)
Creatinine, Ser: 1.92 mg/dL — ABNORMAL HIGH (ref 0.61–1.24)
GFR calc Af Amer: 43 mL/min — ABNORMAL LOW (ref >60)
GFR calc non Af Amer: 37 mL/min — ABNORMAL LOW (ref >60)
GLUCOSE: 92 mg/dL (ref 65–99)
Potassium: 3.4 mmol/L — ABNORMAL LOW (ref 3.5–5.1)
Sodium: 139 mmol/L (ref 135–145)

## 2017-07-30 LAB — GLUCOSE, CAPILLARY
GLUCOSE-CAPILLARY: 85 mg/dL (ref 65–99)
GLUCOSE-CAPILLARY: 137 mg/dL — AB (ref 65–99)
GLUCOSE-CAPILLARY: 89 mg/dL (ref 65–99)
Glucose-Capillary: 136 mg/dL — ABNORMAL HIGH (ref 65–99)

## 2017-07-30 MED ORDER — LOPERAMIDE HCL 2 MG PO CAPS: 2.0000 mg | ORAL_CAPSULE | ORAL | Status: DC | PRN

## 2017-07-30 MED ORDER — AMLODIPINE BESYLATE 5 MG PO TABS
5.0000 mg | ORAL_TABLET | Freq: Two times a day (BID) | ORAL | Status: DC
  Administered 2017-07-30 – 2017-07-31 (×3): 5 mg via ORAL
  Filled 2017-07-30 (×2): qty 1

## 2017-07-30 MED ORDER — METHOCARBAMOL 500 MG PO TABS: 500.0000 mg | ORAL_TABLET | Freq: Four times a day (QID) | ORAL | 0 refills | Status: DC | PRN

## 2017-07-30 MED ORDER — CARVEDILOL 25 MG PO TABS: 25.0000 mg | ORAL_TABLET | Freq: Two times a day (BID) | ORAL | 0 refills | Status: AC

## 2017-07-30 MED ORDER — AMLODIPINE BESYLATE 5 MG PO TABS: 5.0000 mg | ORAL_TABLET | Freq: Two times a day (BID) | ORAL | 0 refills | Status: DC

## 2017-07-30 MED ORDER — OXYCODONE HCL 5 MG PO TABS: 5.0000 mg | ORAL_TABLET | ORAL | 0 refills | Status: DC | PRN

## 2017-07-30 MED ORDER — POLYETHYLENE GLYCOL 3350 17 G PO PACK: 17.0000 g | PACK | Freq: Every day | ORAL | 0 refills | Status: AC

## 2017-07-30 MED ORDER — GLIPIZIDE ER 10 MG PO TB24: 10.0000 mg | ORAL_TABLET | Freq: Every day | ORAL | 0 refills | Status: AC

## 2017-07-30 MED ORDER — INSULIN GLARGINE 100 UNIT/ML SOLOSTAR PEN: 10.0000 [IU] | PEN_INJECTOR | Freq: Every day | SUBCUTANEOUS | 11 refills | Status: AC

## 2017-07-30 MED ORDER — ATORVASTATIN CALCIUM 80 MG PO TABS: ORAL_TABLET | ORAL | 1 refills | Status: AC

## 2017-07-30 MED ORDER — AMLODIPINE BESYLATE 5 MG PO TABS: 5.0000 mg | ORAL_TABLET | Freq: Two times a day (BID) | ORAL | Status: DC

## 2017-07-30 NOTE — Discharge Summary (Signed)
Discharge summary job # [id number]

## 2017-07-30 NOTE — Progress Notes (Signed)
Occupational Therapy Session Note  Patient Details  Name: Keith Guzman MRN: [id number] Date of Birth: March 02, 1961  Today's Date: 07/30/2017 OT Individual Time: 7092-9574 OT Individual Time Calculation (min): 57 min    Short Term Goals: Week 2:  OT Short Term Goal 1 (Week 2): STGs equal to LTGs set at modified independent to supervision.    Skilled Therapeutic Interventions/Progress Updates:    Pt completed UB bathing and dressing from wheelchair level to start session.  He was able to complete both tasks with modified independence.  Once finished he ate breakfast with modified independence and then rolled himself to the dayroom with modified independence.  Had him complete 2 sets of 5 mins using the UE ergonometer with resistance set on level 13.  He was able to maintain RPMs from 20-25 for both sets.  First set completed peddling forward and then second was completed peddling backwards.  Finished session with return back to the room with therapist assist.  Pt left with NT in room and call button and phone in reach.    Therapy Documentation Precautions:  Precautions Precautions: Fall Precaution Comments:   Restrictions Weight Bearing Restrictions: No LLE Weight Bearing: Non weight bearing Other Position/Activity Restrictions: L BKA  Pain: Pain Assessment Pain Assessment: No/denies pain Pain Score: 0-No pain ADL: See Function Navigator for Current Functional Status.   Therapy/Group: Individual Therapy  Edita Weyenberg OTR/L  07/30/2017, 12:08 PM

## 2017-07-30 NOTE — Progress Notes (Signed)
Physical Therapy Session Note  Patient Details  Name: Keith Guzman MRN: [id number] Date of Birth: 1961/08/19  Today's Date: 07/30/2017 PT Individual Time: 6301-6010 PT Individual Time Calculation (min): 69 min   Short Term Goals: Week 2:   STG=LTG  Skilled Therapeutic Interventions/Progress Updates:    Pt up in w/c upon arrival, agreeable to PT session. Pt transferring w/c to toilet with supervision and managing cleaning independently. Pt performing hand hygiene at w/c level independently. Pt propelling w/c on unit mod I including door ways and small spaces. Ambulating with supervision X50 ft using rw without LOB. Up/down 2 3 inch steps min assist. Reviewing HEP for LLE and educating pt on need for knee extension as well as continued strengthening. Car transfer performed with cues for reaching back prior to sitting. Following session, pt returned to room with all needs in reach. Pt denies any questions or concerns following.   Therapy Documentation Precautions:  Precautions Precautions: Fall Precaution Comments:   Restrictions Weight Bearing Restrictions: No LLE Weight Bearing: Non weight bearing Other Position/Activity Restrictions: L BKA  Pain Denies pain  See Function Navigator for Current Functional Status.   Therapy/Group: Individual Therapy  Delton See, PT 07/30/2017, 3:49 PM

## 2017-07-30 NOTE — Plan of Care (Signed)
Problem: RH Dressing Goal: LTG Patient will perform lower body dressing w/assist (OT) LTG: Patient will perform lower body dressing with assist, with/without cues in positioning using equipment (OT)  Outcome: Not Met (add Reason) He needs supervision for safety.  Problem: RH Memory Goal: LTG Patient will demonstrate ability for day to day (OT) LTG:  Patient will demonstrate ability for day to day recall/carryover during activities of daily living with assist  (OT)  Outcome: Not Met (add Reason) Needs supervision

## 2017-07-30 NOTE — Progress Notes (Signed)
Physical Therapy Discharge Summary  Patient Details  Name: Keith Guzman MRN: [id number] Date of Birth: December 08, 1960  PT individual time: 30 min, 1640-1510  Pt sitting EOB upon arrival and agreeable to therapy. Spent 30 minutes total discussed HEP for BLEs w/ emphasis on ROM and desensitization of residual limb for future prosthesis use. Pt verbalized understanding of all components of program and appreciative of PT educating pt on activities he can perform at home. Provided with written handout. Ended session sitting at EOB, call needs met. Pt requesting pain medication - pain RN aware.   Patient has met 7 of 7 long term goals due to improved activity tolerance, improved balance, increased strength, decreased pain, ability to compensate for deficits and functional use of  right lower extremity.  Patient to discharge at a wheelchair level Modified Independent.   Patient's care partner is independent to provide the necessary physical assistance at discharge.  Reasons goals not met: n/a  Recommendation:  Patient will benefit from ongoing skilled PT services in home health setting to continue to advance safe functional mobility, address ongoing impairments in endurance, global strength, functional mobility, balance, residual limb care, and minimize fall risk.  Equipment: w/c and RW  Reasons for discharge: treatment goals met and discharge from hospital  Patient/family agrees with progress made and goals achieved: Yes  PT Discharge Precautions/Restrictions Precautions Precautions: Fall Restrictions Weight Bearing Restrictions: Yes LLE Weight Bearing: Non weight bearing Other Position/Activity Restrictions: L BKA Pain Pain Assessment Pain Assessment: Faces Faces Pain Scale: Hurts a little bit Pain Type: Surgical pain Pain Location: Leg Pain Orientation: Left Pain Descriptors / Indicators: Discomfort Pain Onset: With Activity Pain Intervention(s): Repositioned Multiple Pain Sites:  No Vision/Perception  Perception Perception: Within Functional Limits Praxis Praxis: Intact  Cognition Overall Cognitive Status: History of cognitive impairments - at baseline Arousal/Alertness: Awake/alert Orientation Level: Oriented X4 Attention: Sustained Sustained Attention: Appears intact Memory: Impaired Memory Impairment: Storage deficit;Decreased recall of new information Awareness: Impaired Awareness Impairment: Anticipatory impairment Problem Solving: Appears intact Safety/Judgment: Impaired Comments: Pt needs cueing to place UEs on chair before transitioning from standing to sitting.  Pt also still attempts standing during toileting tasks without supervision Sensation Coordination Gross Motor Movements are Fluid and Coordinated: Yes Fine Motor Movements are Fluid and Coordinated: Yes Motor  Motor Motor: Within Functional Limits  Mobility Bed Mobility Bed Mobility: Rolling Right;Rolling Left;Sit to Supine;Supine to Sit Rolling Right: 6: Modified independent (Device/Increase time) Rolling Left: 6: Modified independent (Device/Increase time) Supine to Sit: 6: Modified independent (Device/Increase time) Sit to Supine: 6: Modified independent (Device/Increase time) Transfers Transfers: Yes Sit to Stand: 6: Modified independent (Device/Increase time) Stand to Sit: 6: Modified independent (Device/Increase time) Stand Pivot Transfers: 6: Modified independent (Device/Increase time) Locomotion  Ambulation Ambulation: Yes Ambulation/Gait Assistance: 5: Supervision Ambulation Distance (Feet): 75 Feet Assistive device: Rolling walker Gait Gait: Yes Gait Pattern: Impaired (impaired 2/2 L BKA) Gait velocity: decreased Stairs / Additional Locomotion Stairs: Yes Stairs Assistance: 4: Min guard Stair Management Technique: Two rails Number of Stairs: 2 Product manager Mobility: Yes Wheelchair Assistance: 6: Modified independent (Device/Increase  time) Environmental health practitioner: Both upper extremities Wheelchair Parts Management: Independent Distance: >150'  Trunk/Postural Assessment  Cervical Assessment Cervical Assessment: Exceptions to [facility] (forward head posture) Thoracic Assessment Thoracic Assessment: Exceptions to [facility] (rounded shoulders) Lumbar Assessment Lumbar Assessment: Within Functional Limits Postural Control Postural Control: Within Functional Limits  Balance Balance Balance Assessed: Yes Static Sitting Balance Static Sitting - Balance Support: No upper extremity supported Static Sitting -  Level of Assistance: 7: Independent Dynamic Sitting Balance Dynamic Sitting - Balance Support: During functional activity Dynamic Sitting - Level of Assistance: 6: Modified independent (Device/Increase time) Static Standing Balance Static Standing - Balance Support: Left upper extremity supported;Right upper extremity supported;During functional activity Static Standing - Level of Assistance: 6: Modified independent (Device/Increase time) Dynamic Standing Balance Dynamic Standing - Balance Support: During functional activity;Bilateral upper extremity supported Dynamic Standing - Level of Assistance: 5: Stand by assistance Extremity Assessment  RUE Assessment RUE Assessment: Within Functional Limits LUE Assessment LUE Assessment: Within Functional Limits RLE Assessment RLE Assessment: Within Functional Limits LLE Assessment LLE Assessment: Exceptions to [facility] LLE Strength LLE Overall Strength Comments: hip flexion 4/5, knee extension 4/5, knee flexion 4-/5, hip abd 4/5   See Function Navigator for Current Functional Status.  Monick Rena K Arnette 07/30/2017, 2:35 PM

## 2017-07-30 NOTE — Progress Notes (Signed)
Social Work  Discharge Note  The overall goal for the admission was met for:   Discharge location: Yes-HOME WITH WIFE AND SON-INTERMITTENT ASSIST-DC-SAT  Length of Stay: Yes-11 DAYS  Discharge activity level: Yes-MOD/I [place]  Home/community participation: Yes  Services provided included: MD, RD, PT, OT, RN, CM, Pharmacy and [place]: Medicare and Private Insurance: [facility]  Follow-up services arranged: Home Health: [facility]-PT,OR, RN, DME: [place] and Patient/Family has no preference for HH/DME agencies  Comments (or additional information): PT AT MOD/I LEVEL [place]. WIFE HAS BEEN TRAINED TO GET PT UP THE STEPS INTO THEIR HOME.  Patient/Family verbalized understanding of follow-up arrangements: Yes  Individual responsible for coordination of the follow-up plan: SELF & [name]-WIFE  Confirmed correct DME delivered: Elease Hashimoto 07/30/2017    Elease Hashimoto

## 2017-07-30 NOTE — Progress Notes (Signed)
Physical Therapy Session Note  Patient Details  Name: Keith Guzman MRN: [id number] Date of Birth: 1960/11/04  Today's Date: 07/30/2017 PT Missed Time: 60 Minutes Missed Time Reason: Increased agitation;Patient unwilling to participate (Pt unwilling to adhere to safety guidelines for dressing w/ supervision, increased verbal and physical agitation towards therapist.)  Short Term Goals: Week 1:  PT Short Term Goal 1 (Week 1): STG=LTG due to ELOS  Skilled Therapeutic Interventions/Progress Updates:   Pt toileting upon arrival and requesting to finish prior to therapy. He notes to have gotten to toilet by himself and PT requested pt call nurses station for supervision while getting off toilet. Returned to check on pt and he had already gotten back to w/c. He requested to don LE garments prior to participating in therapy, however refused to do so until therapist left the room. Educated pt that he requires supervision with standing tasks 2/2 to balance and strength deficits. Pt continued to refuse stating "I've done it all week without anyone". Reiterated importance of safety w/ recent amputation, pt became verbally and physically agitated this point and therapist left room. NT in hallway and followed up w/ pt to ensure pt safety.   Therapy Documentation Precautions:  Precautions Precautions: Fall Precaution Comments:   Restrictions Weight Bearing Restrictions: No LLE Weight Bearing: Non weight bearing Other Position/Activity Restrictions: L BKA General: PT Amount of Missed Time (min): 60 Minutes PT Missed Treatment Reason: Increased agitation;Patient unwilling to participate (Pt unwilling to adhere to safety guidelines for dressing w/ supervision, increased verbal and physical agitation towards therapist.)  See Function Navigator for Current Functional Status.   Therapy/Group: Individual Therapy  Deosha Werden K Arnette 07/30/2017, 12:44 PM

## 2017-07-30 NOTE — Plan of Care (Signed)
Problem: RH BOWEL ELIMINATION Goal: RH STG MANAGE BOWEL WITH ASSISTANCE STG Manage Bowel with min.Assistance.  Outcome: Progressing  07/30/17 0429  Bowel Management Goals  STG: Pt will manage bowels with assistance 6-Modified independent   Goal: RH STG MANAGE BOWEL W/MEDICATION W/ASSISTANCE STG Manage Bowel with Medication with min.Assistance.  Outcome: Progressing  07/30/17 0429  Bowel Management Goals  STG: Pt will manage bowels with medication with assistance 6-Modified independent    Problem: RH BLADDER ELIMINATION Goal: RH STG MANAGE BLADDER WITH ASSISTANCE STG Manage Bladder With min.Assistance  Outcome: Progressing  07/29/17 1126  Bladder Management Goals  STG: Pt will manage bladder with assistance 4-Minimal assistance    Problem: RH SKIN INTEGRITY Goal: RH STG MAINTAIN SKIN INTEGRITY WITH ASSISTANCE STG Maintain Skin Integrity With min.Assistance.  Outcome: Progressing  07/30/17 0429  Skin Integrity Goals  STG: Maintain skin integrity with assistance 6-Modified independent    Problem: RH SAFETY Goal: RH STG ADHERE TO SAFETY PRECAUTIONS W/ASSISTANCE/DEVICE STG Adhere to Safety Precautions With mod.Assistance/Device.  Outcome: Progressing  07/30/17 0429  Safety Goals  STG:Pt will adhere to safety precautions with assistance/device 7-Independent   Goal: RH STG DECREASED RISK OF FALL WITH ASSISTANCE STG Decreased Risk of Fall With min.Assistance.  Outcome: Progressing  07/30/17 0429  Safety Goals  JOI:[id number] risk of fall with assistance/device 7-Independent

## 2017-07-30 NOTE — Progress Notes (Signed)
[facility] PHYSICAL MEDICINE & REHABILITATION     PROGRESS NOTE  Subjective/Complaints:  Patient seen lying in bed this morning. He states he slept fairly overnight because he was sweating. Patient's room is warm and when asked if he would like me to trend down the temperature, he states no.  ROS: Denies nausea, vomiting, diarrhea, shortness of breath or chest pain   Objective: Vital Signs: Blood pressure (!) 163/86, pulse 83, temperature 98.9 F (37.2 C), temperature source Oral, resp. rate 18, height 5\' 11"  (1.803 m), weight 74.8 kg (165 lb), SpO2 100 %. No results found. No results for input(s): WBC, HGB, HCT, PLT in the last 72 hours.  Recent Labs  07/28/17 0507 07/30/17 0519  NA 139 139  K 3.8 3.4*  CL 104 108  GLUCOSE 108* 92  BUN 36* 31*  CREATININE 2.38* 1.92*  CALCIUM 8.9 8.4*   CBG (last 3)   Recent Labs  07/29/17 1710 07/29/17 2054 07/30/17 0632  GLUCAP 105* 135* 85    Wt Readings from Last 3 Encounters:  07/22/17 74.8 kg (165 lb)  07/16/17 74.8 kg (165 lb)  05/06/17 73 kg (161 lb)    Physical Exam:  BP (!) 163/86 (BP Location: Left Arm)   Pulse 83   Temp 98.9 F (37.2 C) (Oral)   Resp 18   Ht 5\' 11"  (1.803 m)   Wt 74.8 kg (165 lb)   SpO2 100%   BMI 23.01 kg/m  Constitutional: He appears well-developed. No distress.  HENT: Normocephalicand atraumatic.  Eyes: EOMare normal. No discharge.  Cardiovascular: RRR. No JVD. Respiratory: CTA Bilaterally. Normal effort. GI: Bowel sounds are normal. He exhibits no distension.  Neurological: He is alertand oriented.  Motor: B/L UE 5/5 prox to distal.  RLE: 4+/5 proximal to distal LLE: HF, KE 4+/5 (stable)  Skin: left BK stump with shrinker clean, dry, intact Psych: Very flat.   Assessment/Plan: 1. Functional deficits secondary to left BKA which require 3+ hours per day of interdisciplinary therapy in a comprehensive inpatient rehab setting. Physiatrist is providing close team supervision and 24  hour management of active medical problems listed below. Physiatrist and rehab team continue to assess barriers to discharge/monitor patient progress toward functional and medical goals.  Function:  Bathing Bathing position   Position: Wheelchair/chair at sink  Bathing parts Body parts bathed by patient: Right arm, Left arm, Chest, Abdomen, Front perineal area, Buttocks, Right upper leg, Left upper leg, Right lower leg, Left lower leg Body parts bathed by helper: Back  Bathing assist Assist Level: Supervision or verbal cues      Upper Body Dressing/Undressing Upper body dressing   What is the patient wearing?: Pull over shirt/dress     Pull over shirt/dress - Perfomed by patient: Thread/unthread right sleeve, Thread/unthread left sleeve, Put head through opening, Pull shirt over trunk          Upper body assist Assist Level: Supervision or verbal cues      Lower Body Dressing/Undressing Lower body dressing   What is the patient wearing?: Pants, Non-skid slipper socks     Pants- Performed by patient: Thread/unthread right pants leg, Thread/unthread left pants leg, Pull pants up/down, Fasten/unfasten pants   Non-skid slipper socks- Performed by patient: Don/doff right sock                    Lower body assist Assist for lower body dressing: Supervision or verbal cues      Lexicographer  steps completed by patient: Adjust clothing prior to toileting, Performs perineal hygiene, Adjust clothing after toileting Toileting steps completed by helper: Adjust clothing prior to toileting, Adjust clothing after toileting (per Epimenio FootErin Neal, NT assist with clothing) Toileting Assistive Devices: Grab bar or rail  Toileting assist Assist level: Touching or steadying assistance (Pt.75%)   Transfers Chair/bed transfer   Chair/bed transfer method: Stand pivot Chair/bed transfer assist level: Supervision or verbal cues Chair/bed transfer assistive device: Armrests,  Bedrails     Locomotion Ambulation Ambulation activity did not occur: Refused   Max distance: 475' Assist level: Supervision or verbal cues   Wheelchair   Type: Manual Max wheelchair distance: 250' Assist Level: Supervision or verbal cues  Cognition Comprehension Comprehension assist level: Follows complex conversation/direction with no assist  Expression Expression assist level: Expresses complex ideas: With no assist  Social Interaction Social Interaction assist level: Interacts appropriately with others - No medications needed.  Problem Solving Problem solving assist level: Solves complex problems: With extra time  Memory Memory assist level: Recognizes or recalls 75 - 89% of the time/requires cueing 10 - 24% of the time    Medical Problem List and Plan: 1. Decreased functional mobilitysecondary to left BKA 07/16/2017  Continue CIR, plan for DC tomorrow  Stump shrinker in place  Will see patient for transitional care management in 1-2 weeks post discharge 2. DVT Prophylaxis/Anticoagulation: SCD 3. Pain Management: Robaxin and oxycodone as needed 4. Mood: Provide emotional support 5. Neuropsych: This patient iscapable of making decisions on hisown behalf. 6. Skin/Wound Care: Routine skin checks  Removed vac Saturday without any issues 7. Fluids/Electrolytes/Nutrition: Routine I&O 8.Acute on Chronic anemia. Patient receives Aranesp every 30 days.   Hb 8.8 on 10/29  Cont to monitor 9.Diabetes mellitus and peripheral neuropathy. Hemoglobin A1c 7.5. Glucotrol 10 mg daily, NovoLog 3 units TID with meals.   Check blood sugars before meals and at bedtime. Diabetic teaching  Lantus insulin 15 units daily at bedtime, decreased to 10 units on 10/26  Overall controlled on 11/2 10.CKD stage III with AKI.   Cr.  1.92 on 11/2  Encourage fluids  IVF ordered qhs x3 nights  Will need ambulatory monitoring  Cont to monitor 11.Hypertension.   Coreg 25 mg twice a day.  Norvasc 5  started on 10/25, changed to 2.5 BID on 10/30 for better consistent control, increased to 5 mg twice a day 12.Hyperlipidemia. Lipitor 13. Hypoalbuminemia  Supplement initiated 10/24 14. Constipation  KUB reviewed suggesting constipation  Bowel regimen increased on 10/30  Improving  LOS (Days) 10 A FACE TO FACE EVALUATION WAS PERFORMED  Rashunda Passon Karis Jubanil Timberlee Roblero 07/30/2017 8:39 AM

## 2017-07-30 NOTE — Progress Notes (Signed)
Occupational Therapy Discharge Summary  Patient Details  Name: Keith Guzman MRN: [id number] Date of Birth: 07-Jul-1961  Today's Date: 07/30/2017 OT Individual Time: 1132-1200 OT Individual Time Calculation (min): 28 min   Session Note:  Took pt to the OT gym via wheelchair for session.  Had him work on sit to stand and standing balance while engaged in use of unilateral UE to place checkers in connect four grid.  Pt able to complete with supervision, alternating UEs for reaching.  No LOB noted.  Had pt complete functional mobility down the hallway for 68 ft with supervision using the RW in order to walk back towards his room.  Finished session with pt in wheelchair at bedside with call button and phone in reach.   Patient has met 8 of 10 long term goals due to improved activity tolerance, improved balance and ability to compensate for deficits.  Patient to discharge at overall Supervision level.  Patient's care partner did not attend any OT sessions during stay, even though pt was instructed to have her come in earlier in the week.      Reasons goals not met: Pt still needs supervision for LB dressing sit to stand and for carryover of techniques daily.  Recommendation:  Patient will benefit from ongoing skilled OT services in home health setting to continue to advance functional skills in the area of BADL and Reduce care partner burden.  Feel pt can be modified independent for wheelchair level, including completion of squat pivot transfers to the 3:1 and toileting in sitting with lateral leans.  Recommend supervision for sit to stand and for static/dynamic standing tasks.  Recommend HHOT to further help pt progress to modified independent level for selfcare tasks sit to stand.    Equipment: 3:1   also recommend tub shower bench which pt reports he will get outside of the hospital.  Reasons for discharge: treatment goals met and discharge from hospital  Patient/family agrees with progress made and  goals achieved: Yes  OT Discharge Precautions/Restrictions  Precautions Precautions: Fall Restrictions Weight Bearing Restrictions: No LLE Weight Bearing: Non weight bearing Other Position/Activity Restrictions: L BKA  Pain Pain Assessment Pain Assessment: Faces Faces Pain Scale: Hurts a little bit Pain Type: Surgical pain Pain Location: Leg Pain Orientation: Left Pain Descriptors / Indicators: Discomfort Pain Onset: With Activity Pain Intervention(s): Repositioned Multiple Pain Sites: No ADL  See Function Section in chart for details  Vision Baseline Vision/History: Legally blind Patient Visual Report: No change from baseline Vision Assessment?: No apparent visual deficits Perception  Perception: Within Functional Limits Praxis Praxis: Intact Cognition Overall Cognitive Status: History of cognitive impairments - at baseline Arousal/Alertness: Awake/alert Orientation Level: Oriented X4 Attention: Sustained Sustained Attention: Appears intact Memory: Impaired Memory Impairment: Storage deficit;Decreased recall of new information Awareness: Impaired Awareness Impairment: Anticipatory impairment Problem Solving: Appears intact Safety/Judgment: Impaired Comments: Pt needs cueing to place UEs on chair before transitioning from standing to sitting.  Pt also still attempts standing during toileting tasks without supervision Sensation Sensation Light Touch: Appears Intact Stereognosis: Appears Intact Hot/Cold: Appears Intact Proprioception: Appears Intact Additional Comments: sensation intact in BUEs Coordination Gross Motor Movements are Fluid and Coordinated: Yes Fine Motor Movements are Fluid and Coordinated: Yes Motor  Motor Motor: Within Functional Limits Mobility  Transfers Transfers: Sit to Stand;Stand to Sit Sit to Stand: 5: Supervision;With upper extremity assist;From chair/3-in-1 Stand to Sit: 5: Supervision;With armrests;With upper extremity assist;To  chair/3-in-1  Trunk/Postural Assessment  Cervical Assessment Cervical Assessment: Exceptions to [facility] (  cervical protraction) Thoracic Assessment Thoracic Assessment: Exceptions to [facility] (rounded thoracic posture in sitting ) Lumbar Assessment Lumbar Assessment: Within Functional Limits Postural Control Postural Control: Within Functional Limits  Balance Balance Balance Assessed: Yes Static Sitting Balance Static Sitting - Balance Support: No upper extremity supported Static Sitting - Level of Assistance: 7: Independent Dynamic Sitting Balance Dynamic Sitting - Balance Support: During functional activity Dynamic Sitting - Level of Assistance: 6: Modified independent (Device/Increase time) Static Standing Balance Static Standing - Balance Support: Left upper extremity supported;Right upper extremity supported;During functional activity Static Standing - Level of Assistance: 6: Modified independent (Device/Increase time) Dynamic Standing Balance Dynamic Standing - Balance Support: During functional activity;Bilateral upper extremity supported Dynamic Standing - Level of Assistance: 5: Stand by assistance Extremity/Trunk Assessment RUE Assessment RUE Assessment: Within Functional Limits LUE Assessment LUE Assessment: Within Functional Limits   See Function Navigator for Current Functional Status.  Emmily Pellegrin OTR/L 07/30/2017, 12:43 PM

## 2017-07-31 LAB — GLUCOSE, CAPILLARY: Glucose-Capillary: 92 mg/dL (ref 65–99)

## 2017-07-31 NOTE — Progress Notes (Signed)
Keith Guzman is a 56 y.o. male 11/28/1960 [id number]  Subjective: No new complaints. No new problems. Ready to go home today. Questions on how to removing foot platforms from [facility] - needs to be shown to SO as pt unable to easily visualize same  Objective: Vital signs in last 24 hours: Temp:  [97.9 F (36.6 C)-98 F (36.7 C)] 98 F (36.7 C) (11/03 0514) Pulse Rate:  [86-93] 86 (11/03 0514) Resp:  [17-18] 18 (11/03 0514) BP: (143-168)/(62-88) 165/83 (11/03 0514) SpO2:  [100 %] 100 % (11/03 0514) Weight change:  Last BM Date: 07/30/17  Intake/Output from previous day: 11/02 0701 - 11/03 0700 In: 960 [P.O.:960] Out: 350 [Urine:350]  Physical Exam General: No apparent distress   SO in [facility] Lungs: Normal effort. Lungs clear to auscultation, no crackles or wheezes. Cardiovascular: Regular rate and rhythm, no edema MSkel s/p L BKA, dressing C/D/I   Lab Results: BMET    Component Value Date/Time   NA 139 07/30/2017 0519   NA 136 05/03/2017 0949   K 3.4 (L) 07/30/2017 0519   K 4.7 Sl. Hemolysis 05/03/2017 0949   CL 108 07/30/2017 0519   CO2 23 07/30/2017 0519   CO2 24 05/03/2017 0949   GLUCOSE 92 07/30/2017 0519   GLUCOSE 224 (H) 05/03/2017 0949   BUN 31 (H) 07/30/2017 0519   BUN 23.1 05/03/2017 0949   CREATININE 1.92 (H) 07/30/2017 0519   CREATININE 2.1 (H) 05/03/2017 0949   CALCIUM 8.4 (L) 07/30/2017 0519   CALCIUM 8.9 05/03/2017 0949   GFRNONAA 37 (L) 07/30/2017 0519   GFRNONAA 72 08/17/2014 1056   GFRAA 43 (L) 07/30/2017 0519   GFRAA 83 08/17/2014 1056   CBC    Component Value Date/Time   WBC 7.2 07/26/2017 0710   RBC 3.75 (L) 07/26/2017 0710   HGB 8.8 (L) 07/26/2017 0710   HGB 10.9 (L) 07/12/2017 0916   HCT 29.4 (L) 07/26/2017 0710   HCT 35.6 (L) 07/12/2017 0916   PLT 387 07/26/2017 0710   PLT 315 07/12/2017 0916   MCV 78.4 07/26/2017 0710   MCV 78.8 (L) 07/12/2017 0916   MCH 23.5 (L) 07/26/2017 0710   MCHC 29.9 (L) 07/26/2017 0710   RDW 16.4 (H) 07/26/2017  0710   RDW 15.6 (H) 07/12/2017 0916   LYMPHSABS 1.8 07/26/2017 0710   LYMPHSABS 1.7 07/12/2017 0916   MONOABS 0.8 07/26/2017 0710   MONOABS 0.7 07/12/2017 0916   EOSABS 0.4 07/26/2017 0710   EOSABS 0.3 07/12/2017 0916   BASOSABS 0.0 07/26/2017 0710   BASOSABS 0.0 07/12/2017 0916   CBG's (last 3):   Recent Labs  07/30/17 1654 07/30/17 2109 07/31/17 0656  GLUCAP 136* 89 92   LFT's Lab Results  Component Value Date   ALT 9 (L) 07/21/2017   AST 19 07/21/2017   ALKPHOS 65 07/21/2017   BILITOT 0.6 07/21/2017    Studies/Results: No results found.  Medications:  I have reviewed the patient's current medications. Scheduled Medications: . amLODipine  5 mg Oral BID  . aspirin EC  81 mg Oral Daily  . atorvastatin  80 mg Oral q1800  . carvedilol  25 mg Oral BID WC  . [START ON 08/12/2017] darbepoetin (ARANESP) injection - NON-DIALYSIS  300 mcg Subcutaneous Q30 days  . feeding supplement (PRO-STAT SUGAR FREE 64)  30 mL Oral BID  . glipiZIDE  10 mg Oral Q breakfast  . insulin aspart  0-9 Units Subcutaneous TID WC  . insulin aspart  3 Units Subcutaneous  TID WC  . insulin glargine  10 Units Subcutaneous QHS  . multivitamins with iron  1 tablet Oral Daily  . polyethylene glycol  17 g Oral Daily  . senna-docusate  2 tablet Oral BID   PRN Medications: acetaminophen **OR** acetaminophen, bisacodyl, loperamide, methocarbamol **OR** methocarbamol (ROBAXIN)  IV, ondansetron **OR** ondansetron (ZOFRAN) IV, oxyCODONE, sorbitol  Assessment/Plan: Active Problems:   Amputation of left lower extremity below knee (HCC)   Unilateral complete BKA, left, sequela (HCC)   Type 2 diabetes mellitus with peripheral neuropathy (HCC)   Hypoalbuminemia due to protein-calorie malnutrition (HCC)   Hypoglycemia due to type 2 diabetes mellitus (HCC)   CKD (chronic kidney disease) stage 3, GFR 30-59 ml/min (HCC)   AKI (acute kidney injury) (HCC)   Length of stay, days: 11  Stable and ready for DC  home today as planned -  see DC summary dictation 07/30/17 - No changes  Darrian Grzelak A. Felicity CoyerLeschber, MD 07/31/2017, 9:38 AM

## 2017-07-31 NOTE — Progress Notes (Signed)
Pt discharged to home with wife. All equipment was delivered and sent home with patient/

## 2017-07-31 NOTE — Progress Notes (Signed)
Getting OOB without assistance, refusing to allow staff to help with transfers to BR or change clothes, etc. Keith Guzman

## 2017-08-02 NOTE — Discharge Summary (Signed)
NAME:  Keith Guzman, Keith Guzman                     ACCOUNT NO.:  [id number]  MEDICAL RECORD NO.:  [id number]  LOCATION:                                 FACILITY:  PHYSICIAN:  Maryla Morrow, MD             DATE OF BIRTH:  DATE OF ADMISSION:  07/20/2017 DATE OF DISCHARGE:  07/31/2017                              DISCHARGE SUMMARY   DISCHARGE DIAGNOSES: 1. Left below knee amputation on July 16, 2017. 2. SCDs for DVT prophylaxis. 3. Pain management. 4. Acute on chronic anemia. 5. Diabetes mellitus. 6. Peripheral neuropathy. 7. Chronic kidney disease stage 3. 8. Hypertension. 9. Hyperlipidemia. 10.Constipation, resolved.  HISTORY OF PRESENT ILLNESS:  This is a 56 year old right-handed male, history of cardiomyopathy, ejection fraction 25%, CKD stage 3, hypertension, diabetes mellitus, remote tobacco abuse as well as left transmetatarsal amputation on December 14, 2012.  Lives with spouse. Modified independent prior to admission.  Presented on July 17, 2007, with gangrenous changes of left lower extremity.  Limb was not felt to be salvageable.  Underwent left BKA on July 16, 2017, per Dr. Lajoyce Corners. Hospital course, pain management.  Wound VAC had been in place. Physical and occupational therapy ongoing.  The patient was admitted for a comprehensive rehab program.  PAST MEDICAL HISTORY:  See discharge diagnoses.  SOCIAL HISTORY:  Lives with spouse, modified independent prior to admission.  FUNCTIONAL STATUS UPON ADMISSION TO REHAB SERVICES:  Minimal guard 10 feet rolling walker, minimal assist sit to stand, minimal assist activities of daily living.  PHYSICAL EXAMINATION:  VITAL SIGNS:  Blood pressure 158/81, pulse 88, temperature 99, and respirations 17. GENERAL:  This was an alert male, in no acute distress, oriented x3. HEENT:  EOMs intact. NECK:  Supple.  Nontender.  No JVD. CARDIAC:  Rate controlled. ABDOMEN:  Soft, nontender.  Good bowel sounds. LUNGS:  Clear to auscultation without  wheeze. EXTREMITIES:  BKA site was dressed with wound VAC in place.  REHABILITATION HOSPITAL COURSE:  The patient was admitted to Inpatient Rehab Services.  Therapies initiated on a 3-hour daily basis, consisting of physical therapy, occupational therapy, and rehabilitation nursing. The following issues were addressed during the patient's rehabilitation stay.  Pertaining to Mr. Hansbury left BKA, surgical site healing nicely, stump shrinker in place.  He would follow up with Dr. Lajoyce Corners.  Robaxin, oxycodone for pain control.  Acute on chronic anemia, 8.8, monitored. No bleeding episodes.  Blood sugars acceptable, hemoglobin A1c of 7.5. He remained on Lantus insulin as well as Glucotrol, full diabetic teaching.  CKD stage 3, creatinine up slightly 2.38.  He did receive IV fluids for a couple of nights, latest creatinine improved to 1.92. Blood pressures controlled on Coreg as well as Norvasc.  He continued on Lipitor for hyperlipidemia.  Bouts of constipation with bowel program adjusted, however, the patient did refuse some of his bowel medications and underwent full teaching with bowel program well established.  The patient received weekly collaborative interdisciplinary team conferences to discuss estimated length of stay, family teaching, any barriers to discharge.  He transferred wheelchair supervision, self propel his wheelchair, setup assist for sink, activities  of daily living. Ambulates within the room with supervision, emphasis on turning in tight spaces, gather belongings for activities of daily living and homemaking. Full family teaching was completed and plan discharge to home.  DISCHARGE MEDICATIONS: 1. Norvasc 2.5 mg p.o. b.i.d. 2. Aspirin 81 mg p.o. daily. 3. Lipitor 80 mg p.o. daily. 4. Coreg 25 mg p.o. b.i.d. 5. Glucotrol 10 mg daily. 6. Lantus insulin 10 units daily. 7. Multivitamin daily. 8. MiraLAX daily. 9. Robaxin 500 mg every 6 hours as needed muscle  spasms. 10.Oxycodone 5-10 mg every 4 hours as needed pain.  DIET:  His diet was a diabetic diet.  FOLLOWUP:  The patient would follow up with Dr. Maryla MorrowAnkit Patel at the Outpatient Rehab Service office as directed; Dr. Aldean BakerMarcus Duda, call for appointment; Dr. Farris HasAaron Morrow, Medical Management.     Mariam Dollaraniel Merrilee Ancona, P.A.   ______________________________ Maryla MorrowAnkit Patel, MD    DA/MEDQ  D:  07/30/2017  T:  07/31/2017  Job:  [id number]  cc:   Maryla MorrowAnkit Patel, MD Dr. Billey ChangAaron Morrow Marcus V. Duda, MD

## 2017-08-03 ENCOUNTER — Ambulatory Visit (INDEPENDENT_AMBULATORY_CARE_PROVIDER_SITE_OTHER): Payer: Medicare Other | Admitting: Orthopedic Surgery

## 2017-08-05 ENCOUNTER — Ambulatory Visit (INDEPENDENT_AMBULATORY_CARE_PROVIDER_SITE_OTHER): Payer: 59 | Admitting: Orthopedic Surgery

## 2017-08-05 ENCOUNTER — Encounter (INDEPENDENT_AMBULATORY_CARE_PROVIDER_SITE_OTHER): Payer: Self-pay | Admitting: Orthopedic Surgery

## 2017-08-05 VITALS — Ht 71.0 in | Wt 165.0 lb

## 2017-08-05 DIAGNOSIS — Z89512 Acquired absence of left leg below knee: Secondary | ICD-10-CM

## 2017-08-05 DIAGNOSIS — S88112A Complete traumatic amputation at level between knee and ankle, left lower leg, initial encounter: Secondary | ICD-10-CM
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
## 2017-08-09 ENCOUNTER — Inpatient Hospital Stay: Payer: Medicare Other | Attending: Hematology

## 2017-08-09 ENCOUNTER — Inpatient Hospital Stay (HOSPITAL_BASED_OUTPATIENT_CLINIC_OR_DEPARTMENT_OTHER): Payer: Medicare Other

## 2017-08-09 VITALS — BP 158/88 | HR 90 | Temp 97.8°F | Resp 20

## 2017-08-09 DIAGNOSIS — D631 Anemia in chronic kidney disease: Secondary | ICD-10-CM | POA: Diagnosis not present

## 2017-08-09 DIAGNOSIS — N189 Chronic kidney disease, unspecified: Secondary | ICD-10-CM

## 2017-08-09 DIAGNOSIS — D638 Anemia in other chronic diseases classified elsewhere: Secondary | ICD-10-CM

## 2017-08-09 LAB — COMPREHENSIVE METABOLIC PANEL
ALT: 34 U/L (ref 0–55)
ANION GAP: 5 meq/L (ref 3–11)
AST: 19 U/L (ref 5–34)
Albumin: 3.1 g/dL — ABNORMAL LOW (ref 3.5–5.0)
Alkaline Phosphatase: 105 U/L (ref 40–150)
BILIRUBIN TOTAL: 0.26 mg/dL (ref 0.20–1.20)
BUN: 20.9 mg/dL (ref 7.0–26.0)
CALCIUM: 8.9 mg/dL (ref 8.4–10.4)
CHLORIDE: 107 meq/L (ref 98–109)
CO2: 26 mEq/L (ref 22–29)
CREATININE: 1.8 mg/dL — AB (ref 0.7–1.3)
EGFR: 47 mL/min/{1.73_m2} — AB (ref >60)
Glucose: 147 mg/dl — ABNORMAL HIGH (ref 70–140)
Potassium: 4.5 mEq/L (ref 3.5–5.1)
Sodium: 138 mEq/L (ref 136–145)
TOTAL PROTEIN: 7 g/dL (ref 6.4–8.3)

## 2017-08-09 LAB — CBC WITH DIFFERENTIAL/PLATELET
BASO%: 0.3 % (ref 0.0–2.0)
BASOS ABS: 0 10*3/uL (ref 0.0–0.1)
EOS ABS: 0.3 10*3/uL (ref 0.0–0.5)
EOS%: 4.4 % (ref 0.0–7.0)
HEMATOCRIT: 30.9 % — AB (ref 38.4–49.9)
HGB: 9.1 g/dL — ABNORMAL LOW (ref 13.0–17.1)
LYMPH#: 1.4 10*3/uL (ref 0.9–3.3)
LYMPH%: 18.1 % (ref 14.0–49.0)
MCH: 23.6 pg — ABNORMAL LOW (ref 27.2–33.4)
MCHC: 29.4 g/dL — AB (ref 32.0–36.0)
MCV: 80.3 fL (ref 79.3–98.0)
MONO#: 0.6 10*3/uL (ref 0.1–0.9)
MONO%: 7.9 % (ref 0.0–14.0)
NEUT#: 5.3 10*3/uL (ref 1.5–6.5)
NEUT%: 69.3 % (ref 39.0–75.0)
PLATELETS: 315 10*3/uL (ref 140–400)
RBC: 3.85 10*6/uL — AB (ref 4.20–5.82)
RDW: 16.4 % — ABNORMAL HIGH (ref 11.0–14.6)
WBC: 7.7 10*3/uL (ref 4.0–10.3)

## 2017-08-09 MED ORDER — DARBEPOETIN ALFA 150 MCG/0.3ML IJ SOSY
150.0000 ug | PREFILLED_SYRINGE | Freq: Once | INTRAMUSCULAR | Status: AC
  Administered 2017-08-09: 150 ug via SUBCUTANEOUS
  Filled 2017-08-09: qty 0.3

## 2017-08-09 NOTE — Patient Instructions (Signed)

## 2017-08-18 ENCOUNTER — Inpatient Hospital Stay: Payer: 59 | Admitting: Physical Medicine & Rehabilitation

## 2017-09-01 ENCOUNTER — Telehealth (INDEPENDENT_AMBULATORY_CARE_PROVIDER_SITE_OTHER): Payer: Self-pay | Admitting: Family

## 2017-09-01 NOTE — Telephone Encounter (Signed)
08/05/2017 OV note faxed to [employer] [phone]

## 2017-09-02 ENCOUNTER — Encounter: Payer: Self-pay | Admitting: Physical Medicine & Rehabilitation

## 2017-09-02 ENCOUNTER — Other Ambulatory Visit: Payer: Self-pay

## 2017-09-02 ENCOUNTER — Encounter: Payer: 59 | Attending: Physical Medicine & Rehabilitation | Admitting: Physical Medicine & Rehabilitation

## 2017-09-02 VITALS — BP 158/94 | HR 87

## 2017-09-02 DIAGNOSIS — Z8042 Family history of malignant neoplasm of prostate: Secondary | ICD-10-CM | POA: Insufficient documentation

## 2017-09-02 DIAGNOSIS — N179 Acute kidney failure, unspecified: Secondary | ICD-10-CM | POA: Insufficient documentation

## 2017-09-02 DIAGNOSIS — Z8249 Family history of ischemic heart disease and other diseases of the circulatory system: Secondary | ICD-10-CM | POA: Diagnosis not present

## 2017-09-02 DIAGNOSIS — K5900 Constipation, unspecified: Secondary | ICD-10-CM | POA: Diagnosis not present

## 2017-09-02 DIAGNOSIS — I10 Essential (primary) hypertension: Secondary | ICD-10-CM | POA: Diagnosis not present

## 2017-09-02 DIAGNOSIS — E1122 Type 2 diabetes mellitus with diabetic chronic kidney disease: Secondary | ICD-10-CM | POA: Insufficient documentation

## 2017-09-02 DIAGNOSIS — Z87891 Personal history of nicotine dependence: Secondary | ICD-10-CM | POA: Insufficient documentation

## 2017-09-02 DIAGNOSIS — G8918 Other acute postprocedural pain: Secondary | ICD-10-CM | POA: Diagnosis not present

## 2017-09-02 DIAGNOSIS — Z9889 Other specified postprocedural states: Secondary | ICD-10-CM | POA: Diagnosis not present

## 2017-09-02 DIAGNOSIS — Z4781 Encounter for orthopedic aftercare following surgical amputation: Secondary | ICD-10-CM | POA: Diagnosis not present

## 2017-09-02 DIAGNOSIS — I129 Hypertensive chronic kidney disease with stage 1 through stage 4 chronic kidney disease, or unspecified chronic kidney disease: Secondary | ICD-10-CM | POA: Diagnosis not present

## 2017-09-02 DIAGNOSIS — E1142 Type 2 diabetes mellitus with diabetic polyneuropathy: Secondary | ICD-10-CM | POA: Diagnosis not present

## 2017-09-02 DIAGNOSIS — R269 Unspecified abnormalities of gait and mobility: Secondary | ICD-10-CM | POA: Insufficient documentation

## 2017-09-02 DIAGNOSIS — E1151 Type 2 diabetes mellitus with diabetic peripheral angiopathy without gangrene: Secondary | ICD-10-CM | POA: Insufficient documentation

## 2017-09-02 DIAGNOSIS — S88112S Complete traumatic amputation at level between knee and ankle, left lower leg, sequela: Secondary | ICD-10-CM | POA: Diagnosis not present

## 2017-09-02 DIAGNOSIS — N183 Chronic kidney disease, stage 3 unspecified: Secondary | ICD-10-CM

## 2017-09-02 DIAGNOSIS — Z803 Family history of malignant neoplasm of breast: Secondary | ICD-10-CM | POA: Insufficient documentation

## 2017-09-02 DIAGNOSIS — S88112A Complete traumatic amputation at level between knee and ankle, left lower leg, initial encounter: Secondary | ICD-10-CM

## 2017-09-02 DIAGNOSIS — Z89512 Acquired absence of left leg below knee: Secondary | ICD-10-CM | POA: Diagnosis not present

## 2017-09-02 DIAGNOSIS — K5901 Slow transit constipation: Secondary | ICD-10-CM

## 2017-09-02 DIAGNOSIS — I429 Cardiomyopathy, unspecified: Secondary | ICD-10-CM | POA: Diagnosis not present
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
## 2017-09-06 ENCOUNTER — Ambulatory Visit: Payer: 59

## 2017-09-06 ENCOUNTER — Inpatient Hospital Stay: Payer: Medicare Other

## 2017-09-06 ENCOUNTER — Other Ambulatory Visit: Payer: 59

## 2017-09-10 ENCOUNTER — Inpatient Hospital Stay: Payer: Medicare Other | Attending: Hematology

## 2017-09-10 ENCOUNTER — Inpatient Hospital Stay: Payer: Medicare Other
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
## 2017-10-04 ENCOUNTER — Encounter: Payer: Self-pay | Admitting: Hematology

## 2017-10-04 ENCOUNTER — Inpatient Hospital Stay: Payer: Managed Care, Other (non HMO)

## 2017-10-04 ENCOUNTER — Telehealth: Payer: Self-pay | Admitting: Hematology

## 2017-10-04 ENCOUNTER — Inpatient Hospital Stay: Payer: Managed Care, Other (non HMO) | Attending: Hematology | Admitting: Hematology

## 2017-10-04 VITALS — BP 145/83 | HR 85 | Temp 98.7°F | Resp 20 | Ht 71.0 in | Wt 154.8 lb

## 2017-10-04 DIAGNOSIS — Z87891 Personal history of nicotine dependence: Secondary | ICD-10-CM | POA: Diagnosis not present

## 2017-10-04 DIAGNOSIS — Z8042 Family history of malignant neoplasm of prostate: Secondary | ICD-10-CM | POA: Diagnosis not present

## 2017-10-04 DIAGNOSIS — Z8739 Personal history of other diseases of the musculoskeletal system and connective tissue: Secondary | ICD-10-CM | POA: Insufficient documentation

## 2017-10-04 DIAGNOSIS — N189 Chronic kidney disease, unspecified: Secondary | ICD-10-CM | POA: Diagnosis not present

## 2017-10-04 DIAGNOSIS — E785 Hyperlipidemia, unspecified: Secondary | ICD-10-CM | POA: Diagnosis not present

## 2017-10-04 DIAGNOSIS — I739 Peripheral vascular disease, unspecified: Secondary | ICD-10-CM

## 2017-10-04 DIAGNOSIS — D638 Anemia in other chronic diseases classified elsewhere: Secondary | ICD-10-CM

## 2017-10-04 DIAGNOSIS — Z89512 Acquired absence of left leg below knee: Secondary | ICD-10-CM | POA: Diagnosis not present

## 2017-10-04 DIAGNOSIS — Z79899 Other long term (current) drug therapy: Secondary | ICD-10-CM

## 2017-10-04 DIAGNOSIS — I129 Hypertensive chronic kidney disease with stage 1 through stage 4 chronic kidney disease, or unspecified chronic kidney disease: Secondary | ICD-10-CM

## 2017-10-04 DIAGNOSIS — E1122 Type 2 diabetes mellitus with diabetic chronic kidney disease: Secondary | ICD-10-CM

## 2017-10-04 DIAGNOSIS — Z7982 Long term (current) use of aspirin: Secondary | ICD-10-CM

## 2017-10-04 DIAGNOSIS — Z803 Family history of malignant neoplasm of breast: Secondary | ICD-10-CM | POA: Diagnosis not present

## 2017-10-04 DIAGNOSIS — Z794 Long term (current) use of insulin: Secondary | ICD-10-CM | POA: Diagnosis not present

## 2017-10-04 DIAGNOSIS — Z89431 Acquired absence of right foot: Secondary | ICD-10-CM | POA: Diagnosis not present

## 2017-10-04 DIAGNOSIS — E1165 Type 2 diabetes mellitus with hyperglycemia: Secondary | ICD-10-CM | POA: Diagnosis not present

## 2017-10-04 DIAGNOSIS — Z7984 Long term (current) use of oral hypoglycemic drugs: Secondary | ICD-10-CM

## 2017-10-04 LAB — CBC WITH DIFFERENTIAL/PLATELET
Abs Granulocyte: 4.1 10*3/uL (ref 1.5–6.5)
BASOS ABS: 0 10*3/uL (ref 0.0–0.1)
BASOS PCT: 0 %
EOS ABS: 0.3 10*3/uL (ref 0.0–0.5)
EOS PCT: 4 %
HCT: 34.4 % — ABNORMAL LOW (ref 38.4–49.9)
Hemoglobin: 10.4 g/dL — ABNORMAL LOW (ref 13.0–17.1)
LYMPHS ABS: 1.6 10*3/uL (ref 0.9–3.3)
Lymphocytes Relative: 24 %
MCH: 23.5 pg — ABNORMAL LOW (ref 27.2–33.4)
MCHC: 30.2 g/dL — ABNORMAL LOW (ref 32.0–36.0)
MCV: 77.7 fL — ABNORMAL LOW (ref 79.3–98.0)
Monocytes Absolute: 0.5 10*3/uL (ref 0.1–0.9)
Monocytes Relative: 8 %
NEUTROS PCT: 64 %
Neutro Abs: 4.1 10*3/uL (ref 1.5–6.5)
PLATELETS: 253 10*3/uL (ref 140–400)
RBC: 4.43 MIL/uL (ref 4.20–5.82)
RDW: 16.6 % — ABNORMAL HIGH (ref 11.0–15.6)
WBC: 6.5 10*3/uL (ref 4.0–10.3)

## 2017-10-04 MED ORDER — DARBEPOETIN ALFA 150 MCG/0.3ML IJ SOSY
150.0000 ug | PREFILLED_SYRINGE | Freq: Once | INTRAMUSCULAR | Status: DC
  Filled 2017-10-04: qty 0.3

## 2017-10-04 NOTE — Telephone Encounter (Signed)
Scheduled appt per 1/7 los - Gave patient AVS and calender per los.  

## 2017-10-04 NOTE — Progress Notes (Signed)
Pt left facility due to wait time. Advised pt that there would be a wait due to a new system and pt list. Pt stated, " I've been here for 3 hours and I'm ready to go, I'm leaving. Let them know that I'm leaving. Injection not given. Dr. Mosetta Putt desk nurse Morey Hummingbird) was called and informed. Porsche Cates LPN

## 2017-10-04 NOTE — Progress Notes (Signed)
Pt left before receiving Aranesp injection today.  Spoke with wife.  Pt agreed to come back on 10/05/17 for injection.  Appt given to wife.

## 2017-10-05 ENCOUNTER — Ambulatory Visit (INDEPENDENT_AMBULATORY_CARE_PROVIDER_SITE_OTHER): Payer: Managed Care, Other (non HMO) | Admitting: Orthopedic Surgery

## 2017-10-05 ENCOUNTER — Encounter (INDEPENDENT_AMBULATORY_CARE_PROVIDER_SITE_OTHER): Payer: Self-pay | Admitting: Orthopedic Surgery

## 2017-10-05 ENCOUNTER — Inpatient Hospital Stay: Payer: Managed Care, Other (non HMO)

## 2017-10-05 VITALS — BP 158/90 | HR 84 | Temp 98.6°F | Resp 18

## 2017-10-05 VITALS — Ht 71.0 in | Wt 154.0 lb

## 2017-10-05 DIAGNOSIS — Z89431 Acquired absence of right foot: Secondary | ICD-10-CM | POA: Diagnosis not present

## 2017-10-05 DIAGNOSIS — S88112A Complete traumatic amputation at level between knee and ankle, left lower leg, initial encounter: Secondary | ICD-10-CM

## 2017-10-05 DIAGNOSIS — D638 Anemia in other chronic diseases classified elsewhere: Secondary | ICD-10-CM

## 2017-10-05 DIAGNOSIS — I129 Hypertensive chronic kidney disease with stage 1 through stage 4 chronic kidney disease, or unspecified chronic kidney disease: Secondary | ICD-10-CM | POA: Diagnosis not present

## 2017-10-05 DIAGNOSIS — Z89512 Acquired absence of left leg below knee: Secondary | ICD-10-CM

## 2017-10-05 DIAGNOSIS — E1165 Type 2 diabetes mellitus with hyperglycemia: Secondary | ICD-10-CM | POA: Diagnosis not present

## 2017-10-05 DIAGNOSIS — E1122 Type 2 diabetes mellitus with diabetic chronic kidney disease: Secondary | ICD-10-CM | POA: Diagnosis not present

## 2017-10-05 MED ORDER — DARBEPOETIN ALFA 200 MCG/0.4ML IJ SOSY: 150.0000 ug | PREFILLED_SYRINGE | Freq: Once | INTRAMUSCULAR | Status: DC

## 2017-10-05 MED ORDER — DARBEPOETIN ALFA 150 MCG/0.3ML IJ SOSY
150.0000 ug | PREFILLED_SYRINGE | Freq: Once | INTRAMUSCULAR | Status: AC
  Administered 2017-10-05: 150 ug via SUBCUTANEOUS
  Filled 2017-10-05: qty 0.3
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
## 2017-10-05 NOTE — Patient Instructions (Signed)

## 2017-10-15 ENCOUNTER — Telehealth (INDEPENDENT_AMBULATORY_CARE_PROVIDER_SITE_OTHER): Payer: Self-pay | Admitting: Orthopedic Surgery

## 2017-10-15 DIAGNOSIS — R269 Unspecified abnormalities of gait and mobility: Secondary | ICD-10-CM

## 2017-10-15 DIAGNOSIS — Z89512 Acquired absence of left leg below knee: Secondary | ICD-10-CM

## 2017-10-15 NOTE — Telephone Encounter (Signed)
Olegario Messier called for pt to request prescription for physical therapy gate training.

## 2017-10-15 NOTE — Telephone Encounter (Signed)
Faxed to [employer] and sent to [facility] work que as well.

## 2017-10-18 ENCOUNTER — Ambulatory Visit: Payer: 59 | Admitting: Hematology

## 2017-10-18 ENCOUNTER — Other Ambulatory Visit: Payer: 59

## 2017-10-18 ENCOUNTER — Ambulatory Visit: Payer: 59

## 2017-11-01 ENCOUNTER — Inpatient Hospital Stay: Payer: Managed Care, Other (non HMO) | Attending: Hematology

## 2017-11-01 ENCOUNTER — Inpatient Hospital Stay: Payer: Managed Care, Other (non HMO)

## 2017-11-01 DIAGNOSIS — N189 Chronic kidney disease, unspecified: Secondary | ICD-10-CM | POA: Insufficient documentation

## 2017-11-01 DIAGNOSIS — I129 Hypertensive chronic kidney disease with stage 1 through stage 4 chronic kidney disease, or unspecified chronic kidney disease: Secondary | ICD-10-CM | POA: Insufficient documentation

## 2017-11-01 DIAGNOSIS — E1122 Type 2 diabetes mellitus with diabetic chronic kidney disease: Secondary | ICD-10-CM | POA: Insufficient documentation

## 2017-11-01 DIAGNOSIS — E1165 Type 2 diabetes mellitus with hyperglycemia: Secondary | ICD-10-CM | POA: Diagnosis not present

## 2017-11-01 DIAGNOSIS — D638 Anemia in other chronic diseases classified elsewhere: Secondary | ICD-10-CM

## 2017-11-01 LAB — IRON AND TIBC
IRON: 71 ug/dL (ref 42–163)
SATURATION RATIOS: 33 % — AB (ref 42–163)
TIBC: 215 ug/dL (ref 202–409)
UIBC: 144 ug/dL

## 2017-11-01 LAB — COMPREHENSIVE METABOLIC PANEL
ALT: 20 U/L (ref 0–55)
ANION GAP: 7 (ref 3–11)
AST: 16 U/L (ref 5–34)
Albumin: 3.3 g/dL — ABNORMAL LOW (ref 3.5–5.0)
Alkaline Phosphatase: 99 U/L (ref 40–150)
BUN: 18 mg/dL (ref 7–26)
CHLORIDE: 100 mmol/L (ref 98–109)
CO2: 29 mmol/L (ref 22–29)
CREATININE: 2.14 mg/dL — AB (ref 0.70–1.30)
Calcium: 9.1 mg/dL (ref 8.4–10.4)
GFR calc non Af Amer: 32 mL/min — ABNORMAL LOW (ref >60)
GFR, EST AFRICAN AMERICAN: 37 mL/min — AB (ref >60)
Glucose, Bld: 316 mg/dL — ABNORMAL HIGH (ref 70–140)
Potassium: 3.8 mmol/L (ref 3.5–5.1)
SODIUM: 136 mmol/L (ref 136–145)
Total Bilirubin: 0.4 mg/dL (ref 0.2–1.2)
Total Protein: 7.3 g/dL (ref 6.4–8.3)

## 2017-11-01 LAB — CBC WITH DIFFERENTIAL/PLATELET
BASOS PCT: 0 %
Basophils Absolute: 0 10*3/uL (ref 0.0–0.1)
EOS ABS: 0.2 10*3/uL (ref 0.0–0.5)
EOS PCT: 4 %
HCT: 38.2 % — ABNORMAL LOW (ref 38.4–49.9)
Hemoglobin: 11.7 g/dL — ABNORMAL LOW (ref 13.0–17.1)
LYMPHS ABS: 1.3 10*3/uL (ref 0.9–3.3)
Lymphocytes Relative: 19 %
MCH: 24.1 pg — AB (ref 27.2–33.4)
MCHC: 30.6 g/dL — ABNORMAL LOW (ref 32.0–36.0)
MCV: 78.6 fL — ABNORMAL LOW (ref 79.3–98.0)
Monocytes Absolute: 0.5 10*3/uL (ref 0.1–0.9)
Monocytes Relative: 8 %
Neutro Abs: 4.6 10*3/uL (ref 1.5–6.5)
Neutrophils Relative %: 69 %
PLATELETS: 226 10*3/uL (ref 140–400)
RBC: 4.86 MIL/uL (ref 4.20–5.82)
RDW: 16.2 % — ABNORMAL HIGH (ref 11.0–14.6)
WBC: 6.6 10*3/uL (ref 4.0–10.3)

## 2017-11-01 LAB — FERRITIN: Ferritin: 536 ng/mL — ABNORMAL HIGH (ref 22–316)

## 2017-11-01 NOTE — Progress Notes (Signed)
Hemoglobin noted at 11.7 today. No injection needed at this time. Current copy of labs and schedule given to patient. Instructed to follow schedule and call our office should issues occur.

## 2017-11-03 ENCOUNTER — Encounter
Payer: Managed Care, Other (non HMO) | Attending: Physical Medicine & Rehabilitation | Admitting: Physical Medicine & Rehabilitation

## 2017-11-03 ENCOUNTER — Encounter: Payer: Self-pay | Admitting: Physical Medicine & Rehabilitation

## 2017-11-03 VITALS — BP 178/99 | HR 84 | Resp 14

## 2017-11-03 DIAGNOSIS — E1151 Type 2 diabetes mellitus with diabetic peripheral angiopathy without gangrene: Secondary | ICD-10-CM | POA: Insufficient documentation

## 2017-11-03 DIAGNOSIS — K5901 Slow transit constipation: Secondary | ICD-10-CM | POA: Diagnosis not present

## 2017-11-03 DIAGNOSIS — K5900 Constipation, unspecified: Secondary | ICD-10-CM | POA: Diagnosis not present

## 2017-11-03 DIAGNOSIS — I129 Hypertensive chronic kidney disease with stage 1 through stage 4 chronic kidney disease, or unspecified chronic kidney disease: Secondary | ICD-10-CM | POA: Diagnosis not present

## 2017-11-03 DIAGNOSIS — S88112S Complete traumatic amputation at level between knee and ankle, left lower leg, sequela: Secondary | ICD-10-CM | POA: Diagnosis not present

## 2017-11-03 DIAGNOSIS — Z9889 Other specified postprocedural states: Secondary | ICD-10-CM | POA: Insufficient documentation

## 2017-11-03 DIAGNOSIS — Z4781 Encounter for orthopedic aftercare following surgical amputation: Secondary | ICD-10-CM | POA: Diagnosis not present

## 2017-11-03 DIAGNOSIS — E1122 Type 2 diabetes mellitus with diabetic chronic kidney disease: Secondary | ICD-10-CM | POA: Insufficient documentation

## 2017-11-03 DIAGNOSIS — I10 Essential (primary) hypertension: Secondary | ICD-10-CM | POA: Diagnosis not present

## 2017-11-03 DIAGNOSIS — Z803 Family history of malignant neoplasm of breast: Secondary | ICD-10-CM | POA: Diagnosis not present

## 2017-11-03 DIAGNOSIS — I96 Gangrene, not elsewhere classified: Secondary | ICD-10-CM | POA: Diagnosis not present

## 2017-11-03 DIAGNOSIS — R269 Unspecified abnormalities of gait and mobility: Secondary | ICD-10-CM

## 2017-11-03 DIAGNOSIS — E1142 Type 2 diabetes mellitus with diabetic polyneuropathy: Secondary | ICD-10-CM

## 2017-11-03 DIAGNOSIS — Z89512 Acquired absence of left leg below knee: Secondary | ICD-10-CM | POA: Diagnosis not present

## 2017-11-03 DIAGNOSIS — I429 Cardiomyopathy, unspecified: Secondary | ICD-10-CM | POA: Insufficient documentation

## 2017-11-03 DIAGNOSIS — Z8249 Family history of ischemic heart disease and other diseases of the circulatory system: Secondary | ICD-10-CM | POA: Insufficient documentation

## 2017-11-03 DIAGNOSIS — Z87891 Personal history of nicotine dependence: Secondary | ICD-10-CM | POA: Diagnosis not present

## 2017-11-03 DIAGNOSIS — N183 Chronic kidney disease, stage 3 (moderate): Secondary | ICD-10-CM | POA: Diagnosis not present

## 2017-11-03 DIAGNOSIS — N179 Acute kidney failure, unspecified: Secondary | ICD-10-CM | POA: Insufficient documentation

## 2017-11-03 DIAGNOSIS — Z9119 Patient's noncompliance with other medical treatment and regimen: Secondary | ICD-10-CM | POA: Diagnosis not present

## 2017-11-03 DIAGNOSIS — Z8042 Family history of malignant neoplasm of prostate: Secondary | ICD-10-CM | POA: Diagnosis not present

## 2017-11-03 DIAGNOSIS — G8918 Other acute postprocedural pain: Secondary | ICD-10-CM | POA: Diagnosis not present

## 2017-11-03 DIAGNOSIS — Z91199 Patient's noncompliance with other medical treatment and regimen due to unspecified reason: Secondary | ICD-10-CM
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
## 2017-11-04 ENCOUNTER — Ambulatory Visit (INDEPENDENT_AMBULATORY_CARE_PROVIDER_SITE_OTHER): Payer: Managed Care, Other (non HMO) | Admitting: Orthopedic Surgery

## 2017-11-04 DIAGNOSIS — N183 Chronic kidney disease, stage 3 (moderate): Secondary | ICD-10-CM | POA: Diagnosis not present

## 2017-11-04 DIAGNOSIS — E785 Hyperlipidemia, unspecified: Secondary | ICD-10-CM | POA: Diagnosis not present

## 2017-11-04 DIAGNOSIS — I10 Essential (primary) hypertension: Secondary | ICD-10-CM | POA: Diagnosis not present

## 2017-11-04 DIAGNOSIS — Z89519 Acquired absence of unspecified leg below knee: Secondary | ICD-10-CM | POA: Diagnosis not present

## 2017-11-04 DIAGNOSIS — S91309A Unspecified open wound, unspecified foot, initial encounter: Secondary | ICD-10-CM | POA: Diagnosis not present

## 2017-11-04 DIAGNOSIS — E11319 Type 2 diabetes mellitus with unspecified diabetic retinopathy without macular edema: Secondary | ICD-10-CM | POA: Diagnosis not present

## 2017-11-04 DIAGNOSIS — E1140 Type 2 diabetes mellitus with diabetic neuropathy, unspecified: Secondary | ICD-10-CM | POA: Diagnosis not present

## 2017-11-05 ENCOUNTER — Ambulatory Visit (INDEPENDENT_AMBULATORY_CARE_PROVIDER_SITE_OTHER): Payer: Managed Care, Other (non HMO) | Admitting: Family

## 2017-11-05 ENCOUNTER — Encounter (INDEPENDENT_AMBULATORY_CARE_PROVIDER_SITE_OTHER): Payer: Self-pay | Admitting: Family

## 2017-11-05 VITALS — Ht 71.0 in | Wt 154.0 lb

## 2017-11-05 DIAGNOSIS — L97519 Non-pressure chronic ulcer of other part of right foot with unspecified severity: Secondary | ICD-10-CM
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
## 2017-11-08 ENCOUNTER — Encounter: Payer: Self-pay | Admitting: Physical Therapy

## 2017-11-08 ENCOUNTER — Other Ambulatory Visit: Payer: Self-pay

## 2017-11-08 ENCOUNTER — Ambulatory Visit: Payer: Managed Care, Other (non HMO) | Attending: Orthopedic Surgery | Admitting: Physical Therapy

## 2017-11-08 DIAGNOSIS — M79605 Pain in left leg: Secondary | ICD-10-CM | POA: Diagnosis not present

## 2017-11-08 DIAGNOSIS — R2681 Unsteadiness on feet: Secondary | ICD-10-CM | POA: Insufficient documentation

## 2017-11-08 DIAGNOSIS — R2689 Other abnormalities of gait and mobility: Secondary | ICD-10-CM | POA: Diagnosis not present

## 2017-11-08 DIAGNOSIS — R296 Repeated falls: Secondary | ICD-10-CM | POA: Diagnosis not present

## 2017-11-08 DIAGNOSIS — M6281 Muscle weakness (generalized): Secondary | ICD-10-CM | POA: Diagnosis not present
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
## 2017-11-10 ENCOUNTER — Ambulatory Visit: Payer: Managed Care, Other (non HMO) | Admitting: Physical Therapy

## 2017-11-10 ENCOUNTER — Encounter: Payer: Self-pay | Admitting: Physical Therapy

## 2017-11-10 DIAGNOSIS — M6281 Muscle weakness (generalized): Secondary | ICD-10-CM | POA: Diagnosis not present

## 2017-11-10 DIAGNOSIS — R2681 Unsteadiness on feet: Secondary | ICD-10-CM

## 2017-11-10 DIAGNOSIS — M79605 Pain in left leg: Secondary | ICD-10-CM | POA: Diagnosis not present

## 2017-11-10 DIAGNOSIS — R296 Repeated falls: Secondary | ICD-10-CM

## 2017-11-10 DIAGNOSIS — R2689 Other abnormalities of gait and mobility: Secondary | ICD-10-CM
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
## 2017-11-10 NOTE — Patient Instructions (Signed)
Do each exercise 1-2 times per day Do each exercise5 repetitions Hold each exercise for 3 seconds to feel your location  AT SINK FIND YOUR MIDLINE POSITION AND PLACE FEET EQUAL DISTANCE FROM THE MIDLINE.  USE TAPE ON FLOOR TO MARK THE MIDLINE POSITION. You also should try to feel with your limb pressure in socket.  You are trying to feel with limb what you used to feel with the bottom of your foot.  1. Side to Side Shift: Moving your hips only (not shoulders): move weight onto your left leg, HOLD/FEEL.  Move back to equal weight on each leg, HOLD/FEEL. Move weight onto your right leg, HOLD/FEEL. Move back to equal weight on each leg, HOLD/FEEL. Repeat. 2. Front to Back Shift: Moving your hips only (not shoulders): move your weight forward onto your toes, HOLD/FEEL. Move your weight back to equal Flat Foot on both legs, HOLD/FEEL. Move your weight back onto your heels, HOLD/FEEL. Move your weight back to equal on both legs, HOLD/FEEL. Repeat. 3. Moving Cones / Cups: With equal weight on each leg: Hold on with one hand the first time, then progress to no hand supports. Move cups from one side of sink to the other. Place cups ~2" out of your reach, progress to 10" beyond reach. 4. Overhead/Upward Reaching: alternated reaching up to top cabinets or ceiling if no cabinets present. Keep equal weight on each leg. Start with one hand support on counter while other hand reaches and progress to no hand support with reaching. 5.   Looking Over Shoulders: With equal weight on each leg: alternate turning to look          over your shoulders with one hand support on counter as needed. Shift weight to             side looking, pull hip then shoulder then head/eyes around to look behind you. Start       with one hand support & progress to no hand support.

## 2017-11-15 ENCOUNTER — Ambulatory Visit: Payer: Managed Care, Other (non HMO) | Admitting: Physical Therapy

## 2017-11-15 DIAGNOSIS — M79605 Pain in left leg: Secondary | ICD-10-CM | POA: Diagnosis not present

## 2017-11-15 DIAGNOSIS — M6281 Muscle weakness (generalized): Secondary | ICD-10-CM | POA: Diagnosis not present

## 2017-11-15 DIAGNOSIS — R2681 Unsteadiness on feet: Secondary | ICD-10-CM

## 2017-11-15 DIAGNOSIS — R2689 Other abnormalities of gait and mobility: Secondary | ICD-10-CM

## 2017-11-15 DIAGNOSIS — R296 Repeated falls: Secondary | ICD-10-CM | POA: Diagnosis not present
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
## 2017-11-15 NOTE — Patient Instructions (Signed)
Do each exercise 1-2 times per day Do each exercise5 repetitions Hold each exercise for 3 seconds to feel your location  AT SINK FIND YOUR MIDLINE POSITION AND PLACE FEET EQUAL DISTANCE FROM THE MIDLINE.  USE TAPE ON FLOOR TO MARK THE MIDLINE POSITION. You also should try to feel with your limb pressure in socket.  You are trying to feel with limb what you used to feel with the bottom of your foot.  Try to have a chair set up behind you in case you need a quick sitting rest break   1. Side to Side Shift: Moving your hips only (not shoulders): move weight onto your left leg, HOLD/FEEL.  Move back to equal weight on each leg, HOLD/FEEL. Move weight onto your right leg, HOLD/FEEL. Move back to equal weight on each leg, HOLD/FEEL. Repeat. Try not touch the sink during exercise. Only used to keep from falling  2. Front to Back Shift: Moving your hips only (not shoulders): move your weight forward onto your toes, HOLD/FEEL. Move your weight back to equal Flat Foot on both legs, HOLD/FEEL. Move your weight back onto your heels, HOLD/FEEL. Move your weight back to equal on both legs, HOLD/FEEL. Repeat. Again try to do this hands free. If you need to use a hand to maintain safe balance, use your left hand.  3. Moving Cones / Cups: With equal weight on each leg: Hold on with one hand the first time, then progress to no hand supports. Move cups from one side of sink to the other. Place cups ~2" out of your reach, progress to 10" beyond reach. 1. Reach across your body to get cups( R hand reach to left to grab cups and visa versa) 4. Overhead/Upward Reaching: alternated reaching up to top cabinets or ceiling if no cabinets present. Keep equal weight on each leg. Start with one hand support on counter while other hand reaches and progress to no hand support with reaching. 1. Reach as high as you can  Starting with dominate hand.  Try not to touch the cabinet but do so to keep balance and remain safe.  5.    Looking Over Shoulders: With equal weight on each leg: alternate turning to look          over your shoulders with one hand support on counter as needed. Shift weight to             side looking, pull hip then shoulder then head/eyes around to look behind you. Start       with one hand support & progress to no hand support.    To reduce dizziness try to look a target for a few seconds to keep focus.  ALWAYS HOLD ON WITH ONE HAND FOR NOW UNTIL DIZZINESS HAS PASSED.

## 2017-11-16 ENCOUNTER — Encounter (HOSPITAL_BASED_OUTPATIENT_CLINIC_OR_DEPARTMENT_OTHER): Payer: Managed Care, Other (non HMO) | Attending: Internal Medicine

## 2017-11-16 ENCOUNTER — Ambulatory Visit (HOSPITAL_COMMUNITY)
Admission: RE | Admit: 2017-11-16 | Discharge: 2017-11-16 | Disposition: A | Discharge status: Home / Self Care | Payer: Managed Care, Other (non HMO) | Source: Ambulatory Visit | Attending: Internal Medicine | Admitting: Internal Medicine

## 2017-11-16 ENCOUNTER — Other Ambulatory Visit: Payer: Self-pay | Admitting: Internal Medicine

## 2017-11-16 DIAGNOSIS — E11621 Type 2 diabetes mellitus with foot ulcer: Secondary | ICD-10-CM

## 2017-11-16 DIAGNOSIS — Z87891 Personal history of nicotine dependence: Secondary | ICD-10-CM | POA: Insufficient documentation

## 2017-11-16 DIAGNOSIS — E1122 Type 2 diabetes mellitus with diabetic chronic kidney disease: Secondary | ICD-10-CM | POA: Diagnosis not present

## 2017-11-16 DIAGNOSIS — L97519 Non-pressure chronic ulcer of other part of right foot with unspecified severity: Secondary | ICD-10-CM | POA: Diagnosis not present

## 2017-11-16 DIAGNOSIS — L97509 Non-pressure chronic ulcer of other part of unspecified foot with unspecified severity: Secondary | ICD-10-CM

## 2017-11-16 DIAGNOSIS — E1142 Type 2 diabetes mellitus with diabetic polyneuropathy: Secondary | ICD-10-CM | POA: Insufficient documentation

## 2017-11-16 DIAGNOSIS — E1151 Type 2 diabetes mellitus with diabetic peripheral angiopathy without gangrene: Secondary | ICD-10-CM | POA: Insufficient documentation

## 2017-11-16 DIAGNOSIS — Z89512 Acquired absence of left leg below knee: Secondary | ICD-10-CM | POA: Diagnosis not present

## 2017-11-16 DIAGNOSIS — I129 Hypertensive chronic kidney disease with stage 1 through stage 4 chronic kidney disease, or unspecified chronic kidney disease: Secondary | ICD-10-CM | POA: Diagnosis not present

## 2017-11-16 DIAGNOSIS — N183 Chronic kidney disease, stage 3 (moderate): Secondary | ICD-10-CM | POA: Diagnosis not present

## 2017-11-16 DIAGNOSIS — M869 Osteomyelitis, unspecified: Principal | ICD-10-CM

## 2017-11-16 DIAGNOSIS — E1129 Type 2 diabetes mellitus with other diabetic kidney complication: Secondary | ICD-10-CM | POA: Diagnosis not present

## 2017-11-16 DIAGNOSIS — E1169 Type 2 diabetes mellitus with other specified complication: Secondary | ICD-10-CM

## 2017-11-16 DIAGNOSIS — N189 Chronic kidney disease, unspecified: Secondary | ICD-10-CM | POA: Diagnosis not present

## 2017-11-16 DIAGNOSIS — R809 Proteinuria, unspecified: Secondary | ICD-10-CM | POA: Diagnosis not present

## 2017-11-16 DIAGNOSIS — D631 Anemia in chronic kidney disease: Secondary | ICD-10-CM | POA: Diagnosis not present

## 2017-11-16 DIAGNOSIS — Z794 Long term (current) use of insulin: Secondary | ICD-10-CM | POA: Diagnosis not present

## 2017-11-17 ENCOUNTER — Ambulatory Visit: Payer: Managed Care, Other (non HMO) | Admitting: Physical Therapy

## 2017-11-17 DIAGNOSIS — M79605 Pain in left leg: Secondary | ICD-10-CM | POA: Diagnosis not present

## 2017-11-17 DIAGNOSIS — R2689 Other abnormalities of gait and mobility: Secondary | ICD-10-CM | POA: Diagnosis not present

## 2017-11-17 DIAGNOSIS — R2681 Unsteadiness on feet: Secondary | ICD-10-CM

## 2017-11-17 DIAGNOSIS — M6281 Muscle weakness (generalized): Secondary | ICD-10-CM | POA: Diagnosis not present

## 2017-11-17 DIAGNOSIS — R296 Repeated falls: Secondary | ICD-10-CM | POA: Diagnosis not present
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
## 2017-11-23 ENCOUNTER — Ambulatory Visit: Payer: Managed Care, Other (non HMO) | Admitting: Physical Therapy

## 2017-11-25 DIAGNOSIS — R509 Fever, unspecified: Secondary | ICD-10-CM | POA: Diagnosis not present

## 2017-11-25 DIAGNOSIS — J111 Influenza due to unidentified influenza virus with other respiratory manifestations: Secondary | ICD-10-CM | POA: Diagnosis not present

## 2017-11-26 ENCOUNTER — Ambulatory Visit: Payer: Managed Care, Other (non HMO) | Attending: Orthopedic Surgery | Admitting: Physical Therapy

## 2017-11-26 ENCOUNTER — Encounter: Payer: Self-pay | Admitting: Physical Therapy

## 2017-11-26 DIAGNOSIS — R296 Repeated falls: Secondary | ICD-10-CM | POA: Insufficient documentation

## 2017-11-26 DIAGNOSIS — M6281 Muscle weakness (generalized): Secondary | ICD-10-CM | POA: Diagnosis not present

## 2017-11-26 DIAGNOSIS — R2689 Other abnormalities of gait and mobility: Secondary | ICD-10-CM | POA: Diagnosis not present

## 2017-11-26 DIAGNOSIS — M79605 Pain in left leg: Secondary | ICD-10-CM | POA: Insufficient documentation

## 2017-11-26 DIAGNOSIS — Z89512 Acquired absence of left leg below knee: Secondary | ICD-10-CM | POA: Diagnosis not present

## 2017-11-26 DIAGNOSIS — R2681 Unsteadiness on feet: Secondary | ICD-10-CM | POA: Diagnosis not present
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
## 2017-11-29 ENCOUNTER — Inpatient Hospital Stay: Payer: Managed Care, Other (non HMO) | Attending: Hematology

## 2017-11-29 ENCOUNTER — Encounter: Payer: Self-pay | Admitting: Hematology

## 2017-11-29 ENCOUNTER — Encounter (HOSPITAL_BASED_OUTPATIENT_CLINIC_OR_DEPARTMENT_OTHER): Payer: Managed Care, Other (non HMO) | Attending: Internal Medicine

## 2017-11-29 ENCOUNTER — Inpatient Hospital Stay: Payer: Managed Care, Other (non HMO)

## 2017-11-29 VITALS — BP 137/84 | HR 86 | Temp 98.4°F | Resp 20

## 2017-11-29 DIAGNOSIS — E1151 Type 2 diabetes mellitus with diabetic peripheral angiopathy without gangrene: Secondary | ICD-10-CM | POA: Insufficient documentation

## 2017-11-29 DIAGNOSIS — N189 Chronic kidney disease, unspecified: Secondary | ICD-10-CM | POA: Diagnosis not present

## 2017-11-29 DIAGNOSIS — E1122 Type 2 diabetes mellitus with diabetic chronic kidney disease: Secondary | ICD-10-CM | POA: Diagnosis not present

## 2017-11-29 DIAGNOSIS — I129 Hypertensive chronic kidney disease with stage 1 through stage 4 chronic kidney disease, or unspecified chronic kidney disease: Secondary | ICD-10-CM | POA: Diagnosis not present

## 2017-11-29 DIAGNOSIS — E1142 Type 2 diabetes mellitus with diabetic polyneuropathy: Secondary | ICD-10-CM | POA: Insufficient documentation

## 2017-11-29 DIAGNOSIS — M86671 Other chronic osteomyelitis, right ankle and foot: Secondary | ICD-10-CM | POA: Insufficient documentation

## 2017-11-29 DIAGNOSIS — D631 Anemia in chronic kidney disease: Secondary | ICD-10-CM | POA: Diagnosis not present

## 2017-11-29 DIAGNOSIS — D638 Anemia in other chronic diseases classified elsewhere: Secondary | ICD-10-CM | POA: Diagnosis present

## 2017-11-29 DIAGNOSIS — E11621 Type 2 diabetes mellitus with foot ulcer: Secondary | ICD-10-CM | POA: Insufficient documentation

## 2017-11-29 DIAGNOSIS — L97519 Non-pressure chronic ulcer of other part of right foot with unspecified severity: Secondary | ICD-10-CM | POA: Insufficient documentation

## 2017-11-29 DIAGNOSIS — L97518 Non-pressure chronic ulcer of other part of right foot with other specified severity: Secondary | ICD-10-CM | POA: Insufficient documentation

## 2017-11-29 DIAGNOSIS — E1169 Type 2 diabetes mellitus with other specified complication: Secondary | ICD-10-CM | POA: Insufficient documentation

## 2017-11-29 LAB — CBC WITH DIFFERENTIAL/PLATELET
BASOS ABS: 0 10*3/uL (ref 0.0–0.1)
BASOS PCT: 0 %
EOS ABS: 0.3 10*3/uL (ref 0.0–0.5)
Eosinophils Relative: 3 %
HCT: 28.7 % — ABNORMAL LOW (ref 38.4–49.9)
Hemoglobin: 9 g/dL — ABNORMAL LOW (ref 13.0–17.1)
LYMPHS ABS: 1.4 10*3/uL (ref 0.9–3.3)
Lymphocytes Relative: 16 %
MCH: 24.4 pg — ABNORMAL LOW (ref 27.2–33.4)
MCHC: 31.4 g/dL — ABNORMAL LOW (ref 32.0–36.0)
MCV: 77.7 fL — ABNORMAL LOW (ref 79.3–98.0)
MONOS PCT: 8 %
Monocytes Absolute: 0.7 10*3/uL (ref 0.1–0.9)
NEUTROS ABS: 6.3 10*3/uL (ref 1.5–6.5)
NEUTROS PCT: 73 %
PLATELETS: 372 10*3/uL (ref 140–400)
RBC: 3.69 MIL/uL — AB (ref 4.20–5.82)
RDW: 15.9 % — AB (ref 11.0–14.6)
WBC: 8.7 10*3/uL (ref 4.0–10.3)

## 2017-11-29 LAB — IRON AND TIBC
IRON: 55 ug/dL (ref 42–163)
SATURATION RATIOS: 32 % — AB (ref 42–163)
TIBC: 170 ug/dL — AB (ref 202–409)
UIBC: 116 ug/dL

## 2017-11-29 LAB — FERRITIN: Ferritin: 770 ng/mL — ABNORMAL HIGH (ref 22–316)

## 2017-11-29 MED ORDER — DARBEPOETIN ALFA 200 MCG/0.4ML IJ SOSY: 150.0000 ug | PREFILLED_SYRINGE | Freq: Once | INTRAMUSCULAR | Status: DC

## 2017-11-29 MED ORDER — DARBEPOETIN ALFA 150 MCG/0.3ML IJ SOSY
150.0000 ug | PREFILLED_SYRINGE | Freq: Once | INTRAMUSCULAR | Status: AC
  Administered 2017-11-29: 150 ug via SUBCUTANEOUS
  Filled 2017-11-29: qty 0.3

## 2017-11-29 NOTE — Patient Instructions (Signed)

## 2017-11-30 ENCOUNTER — Encounter: Payer: Self-pay | Admitting: Physical Therapy

## 2017-11-30 ENCOUNTER — Ambulatory Visit: Payer: Managed Care, Other (non HMO) | Admitting: Physical Therapy

## 2017-11-30 DIAGNOSIS — R2681 Unsteadiness on feet: Secondary | ICD-10-CM | POA: Diagnosis not present

## 2017-11-30 DIAGNOSIS — R2689 Other abnormalities of gait and mobility: Secondary | ICD-10-CM

## 2017-11-30 DIAGNOSIS — M79605 Pain in left leg: Secondary | ICD-10-CM | POA: Diagnosis not present

## 2017-11-30 DIAGNOSIS — M6281 Muscle weakness (generalized): Secondary | ICD-10-CM

## 2017-11-30 DIAGNOSIS — Z89512 Acquired absence of left leg below knee: Secondary | ICD-10-CM | POA: Diagnosis not present

## 2017-11-30 DIAGNOSIS — R296 Repeated falls: Secondary | ICD-10-CM | POA: Diagnosis not present
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
## 2017-12-02 ENCOUNTER — Encounter: Payer: Self-pay | Admitting: Physical Therapy

## 2017-12-02 ENCOUNTER — Ambulatory Visit: Payer: Managed Care, Other (non HMO) | Admitting: Physical Therapy

## 2017-12-02 DIAGNOSIS — R2681 Unsteadiness on feet: Secondary | ICD-10-CM

## 2017-12-02 DIAGNOSIS — R2689 Other abnormalities of gait and mobility: Secondary | ICD-10-CM | POA: Diagnosis not present

## 2017-12-02 DIAGNOSIS — Z89512 Acquired absence of left leg below knee: Secondary | ICD-10-CM | POA: Diagnosis not present

## 2017-12-02 DIAGNOSIS — R296 Repeated falls: Secondary | ICD-10-CM | POA: Diagnosis not present

## 2017-12-02 DIAGNOSIS — M6281 Muscle weakness (generalized): Secondary | ICD-10-CM | POA: Diagnosis not present

## 2017-12-02 DIAGNOSIS — M79605 Pain in left leg: Secondary | ICD-10-CM | POA: Diagnosis not present
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
## 2017-12-06 ENCOUNTER — Encounter (INDEPENDENT_AMBULATORY_CARE_PROVIDER_SITE_OTHER): Payer: Self-pay | Admitting: Orthopedic Surgery

## 2017-12-06 ENCOUNTER — Ambulatory Visit (INDEPENDENT_AMBULATORY_CARE_PROVIDER_SITE_OTHER): Payer: Managed Care, Other (non HMO) | Admitting: Orthopedic Surgery

## 2017-12-06 VITALS — Ht 71.0 in | Wt 154.0 lb

## 2017-12-06 DIAGNOSIS — L97519 Non-pressure chronic ulcer of other part of right foot with unspecified severity: Secondary | ICD-10-CM

## 2017-12-06 DIAGNOSIS — Z89512 Acquired absence of left leg below knee: Secondary | ICD-10-CM | POA: Diagnosis not present
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
## 2017-12-07 ENCOUNTER — Encounter: Payer: Self-pay | Admitting: Physical Therapy

## 2017-12-07 ENCOUNTER — Ambulatory Visit: Payer: Managed Care, Other (non HMO) | Admitting: Physical Therapy

## 2017-12-07 DIAGNOSIS — M79605 Pain in left leg: Secondary | ICD-10-CM

## 2017-12-07 DIAGNOSIS — Z89512 Acquired absence of left leg below knee: Secondary | ICD-10-CM | POA: Diagnosis not present

## 2017-12-07 DIAGNOSIS — R296 Repeated falls: Secondary | ICD-10-CM | POA: Diagnosis not present

## 2017-12-07 DIAGNOSIS — R2689 Other abnormalities of gait and mobility: Secondary | ICD-10-CM | POA: Diagnosis not present

## 2017-12-07 DIAGNOSIS — M6281 Muscle weakness (generalized): Secondary | ICD-10-CM

## 2017-12-07 DIAGNOSIS — S88112A Complete traumatic amputation at level between knee and ankle, left lower leg, initial encounter: Secondary | ICD-10-CM

## 2017-12-07 DIAGNOSIS — R2681 Unsteadiness on feet: Secondary | ICD-10-CM
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
## 2017-12-09 ENCOUNTER — Encounter: Payer: Self-pay | Admitting: Physical Therapy

## 2017-12-09 ENCOUNTER — Ambulatory Visit: Payer: Managed Care, Other (non HMO) | Admitting: Physical Therapy

## 2017-12-09 DIAGNOSIS — M79605 Pain in left leg: Secondary | ICD-10-CM

## 2017-12-09 DIAGNOSIS — E1142 Type 2 diabetes mellitus with diabetic polyneuropathy: Secondary | ICD-10-CM | POA: Diagnosis not present

## 2017-12-09 DIAGNOSIS — R2689 Other abnormalities of gait and mobility: Secondary | ICD-10-CM

## 2017-12-09 DIAGNOSIS — M86671 Other chronic osteomyelitis, right ankle and foot: Secondary | ICD-10-CM | POA: Diagnosis not present

## 2017-12-09 DIAGNOSIS — R2681 Unsteadiness on feet: Secondary | ICD-10-CM | POA: Diagnosis not present

## 2017-12-09 DIAGNOSIS — R296 Repeated falls: Secondary | ICD-10-CM | POA: Diagnosis not present

## 2017-12-09 DIAGNOSIS — E1169 Type 2 diabetes mellitus with other specified complication: Secondary | ICD-10-CM | POA: Diagnosis not present

## 2017-12-09 DIAGNOSIS — L97519 Non-pressure chronic ulcer of other part of right foot with unspecified severity: Secondary | ICD-10-CM | POA: Diagnosis not present

## 2017-12-09 DIAGNOSIS — M6281 Muscle weakness (generalized): Secondary | ICD-10-CM | POA: Diagnosis not present

## 2017-12-09 DIAGNOSIS — M8668 Other chronic osteomyelitis, other site: Secondary | ICD-10-CM | POA: Diagnosis not present

## 2017-12-09 DIAGNOSIS — L089 Local infection of the skin and subcutaneous tissue, unspecified: Secondary | ICD-10-CM | POA: Diagnosis not present

## 2017-12-09 DIAGNOSIS — Z89512 Acquired absence of left leg below knee: Secondary | ICD-10-CM | POA: Diagnosis not present

## 2017-12-09 DIAGNOSIS — E11621 Type 2 diabetes mellitus with foot ulcer: Secondary | ICD-10-CM | POA: Diagnosis not present

## 2017-12-09 DIAGNOSIS — L97518 Non-pressure chronic ulcer of other part of right foot with other specified severity: Secondary | ICD-10-CM | POA: Diagnosis not present

## 2017-12-09 DIAGNOSIS — E1151 Type 2 diabetes mellitus with diabetic peripheral angiopathy without gangrene: Secondary | ICD-10-CM | POA: Diagnosis not present
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
## 2017-12-13 ENCOUNTER — Ambulatory Visit: Payer: Managed Care, Other (non HMO) | Admitting: Physical Therapy

## 2017-12-13 ENCOUNTER — Encounter: Payer: Self-pay | Admitting: Physical Therapy

## 2017-12-13 DIAGNOSIS — M79605 Pain in left leg: Secondary | ICD-10-CM | POA: Diagnosis not present

## 2017-12-13 DIAGNOSIS — S88112A Complete traumatic amputation at level between knee and ankle, left lower leg, initial encounter: Secondary | ICD-10-CM

## 2017-12-13 DIAGNOSIS — M6281 Muscle weakness (generalized): Secondary | ICD-10-CM | POA: Diagnosis not present

## 2017-12-13 DIAGNOSIS — R2689 Other abnormalities of gait and mobility: Secondary | ICD-10-CM

## 2017-12-13 DIAGNOSIS — R2681 Unsteadiness on feet: Secondary | ICD-10-CM

## 2017-12-13 DIAGNOSIS — R296 Repeated falls: Secondary | ICD-10-CM

## 2017-12-13 DIAGNOSIS — Z89512 Acquired absence of left leg below knee: Secondary | ICD-10-CM | POA: Diagnosis not present
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
## 2017-12-13 NOTE — Patient Instructions (Signed)
Put prosthesis on fist thing in the morning.  Wear for 3 hours take off and pat leg and liner dry. Put prosthesis right back on for another 3 hours( 6 total)  Take off prosthesis for 2 hour break  Place prosthesis back on for 3 hours. Take off prosthesis pat leg and liner dry. Put prosthesis right back on for another 3 hours( total 6 hours)  Take off prosthesis for bed time

## 2017-12-17 ENCOUNTER — Ambulatory Visit: Payer: Managed Care, Other (non HMO) | Admitting: Rehabilitative and Restorative Service Providers"

## 2017-12-17 ENCOUNTER — Encounter: Payer: Self-pay | Admitting: Rehabilitative and Restorative Service Providers"

## 2017-12-17 DIAGNOSIS — S88112A Complete traumatic amputation at level between knee and ankle, left lower leg, initial encounter: Secondary | ICD-10-CM

## 2017-12-17 DIAGNOSIS — M79605 Pain in left leg: Secondary | ICD-10-CM

## 2017-12-17 DIAGNOSIS — R2689 Other abnormalities of gait and mobility: Secondary | ICD-10-CM

## 2017-12-17 DIAGNOSIS — R296 Repeated falls: Secondary | ICD-10-CM | POA: Diagnosis not present

## 2017-12-17 DIAGNOSIS — Z89512 Acquired absence of left leg below knee: Secondary | ICD-10-CM | POA: Diagnosis not present

## 2017-12-17 DIAGNOSIS — R2681 Unsteadiness on feet: Secondary | ICD-10-CM | POA: Diagnosis not present

## 2017-12-17 DIAGNOSIS — M6281 Muscle weakness (generalized): Secondary | ICD-10-CM | POA: Diagnosis not present
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
## 2017-12-20 ENCOUNTER — Encounter: Payer: Self-pay | Admitting: Physical Therapy

## 2017-12-20 ENCOUNTER — Ambulatory Visit: Payer: Managed Care, Other (non HMO) | Admitting: Physical Therapy

## 2017-12-20 ENCOUNTER — Other Ambulatory Visit (INDEPENDENT_AMBULATORY_CARE_PROVIDER_SITE_OTHER): Payer: Self-pay | Admitting: Orthopedic Surgery

## 2017-12-20 DIAGNOSIS — I739 Peripheral vascular disease, unspecified: Secondary | ICD-10-CM

## 2017-12-20 DIAGNOSIS — Z89512 Acquired absence of left leg below knee: Secondary | ICD-10-CM | POA: Diagnosis not present

## 2017-12-20 DIAGNOSIS — M79605 Pain in left leg: Secondary | ICD-10-CM

## 2017-12-20 DIAGNOSIS — R2689 Other abnormalities of gait and mobility: Secondary | ICD-10-CM

## 2017-12-20 DIAGNOSIS — R2681 Unsteadiness on feet: Secondary | ICD-10-CM | POA: Diagnosis not present

## 2017-12-20 DIAGNOSIS — M6281 Muscle weakness (generalized): Secondary | ICD-10-CM | POA: Diagnosis not present

## 2017-12-20 DIAGNOSIS — R296 Repeated falls: Secondary | ICD-10-CM

## 2017-12-20 DIAGNOSIS — S88112A Complete traumatic amputation at level between knee and ankle, left lower leg, initial encounter: Secondary | ICD-10-CM
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
## 2017-12-21 ENCOUNTER — Other Ambulatory Visit: Payer: Self-pay | Admitting: Hematology

## 2017-12-21 DIAGNOSIS — D638 Anemia in other chronic diseases classified elsewhere: Secondary | ICD-10-CM

## 2017-12-23 ENCOUNTER — Ambulatory Visit: Payer: Managed Care, Other (non HMO) | Admitting: Physical Therapy

## 2017-12-23 ENCOUNTER — Encounter: Payer: Self-pay | Admitting: Physical Therapy

## 2017-12-23 ENCOUNTER — Ambulatory Visit (HOSPITAL_COMMUNITY)
Admission: RE | Admit: 2017-12-23 | Discharge: 2017-12-23 | Disposition: A | Discharge status: Home / Self Care | Payer: Managed Care, Other (non HMO) | Source: Ambulatory Visit | Attending: Cardiology | Admitting: Cardiology

## 2017-12-23 DIAGNOSIS — Z89512 Acquired absence of left leg below knee: Secondary | ICD-10-CM | POA: Insufficient documentation

## 2017-12-23 DIAGNOSIS — Z87891 Personal history of nicotine dependence: Secondary | ICD-10-CM | POA: Diagnosis not present

## 2017-12-23 DIAGNOSIS — I70201 Unspecified atherosclerosis of native arteries of extremities, right leg: Secondary | ICD-10-CM | POA: Insufficient documentation

## 2017-12-23 DIAGNOSIS — R2689 Other abnormalities of gait and mobility: Secondary | ICD-10-CM

## 2017-12-23 DIAGNOSIS — E119 Type 2 diabetes mellitus without complications: Secondary | ICD-10-CM | POA: Diagnosis not present

## 2017-12-23 DIAGNOSIS — E785 Hyperlipidemia, unspecified: Secondary | ICD-10-CM | POA: Insufficient documentation

## 2017-12-23 DIAGNOSIS — R2681 Unsteadiness on feet: Secondary | ICD-10-CM

## 2017-12-23 DIAGNOSIS — R296 Repeated falls: Secondary | ICD-10-CM

## 2017-12-23 DIAGNOSIS — I10 Essential (primary) hypertension: Secondary | ICD-10-CM | POA: Diagnosis not present

## 2017-12-23 DIAGNOSIS — I739 Peripheral vascular disease, unspecified: Secondary | ICD-10-CM | POA: Diagnosis not present

## 2017-12-23 DIAGNOSIS — Z794 Long term (current) use of insulin: Secondary | ICD-10-CM | POA: Insufficient documentation

## 2017-12-23 DIAGNOSIS — S88112A Complete traumatic amputation at level between knee and ankle, left lower leg, initial encounter: Secondary | ICD-10-CM

## 2017-12-23 DIAGNOSIS — M6281 Muscle weakness (generalized): Secondary | ICD-10-CM | POA: Diagnosis not present

## 2017-12-23 DIAGNOSIS — M79605 Pain in left leg: Secondary | ICD-10-CM | POA: Diagnosis not present

## 2017-12-23 DIAGNOSIS — E11621 Type 2 diabetes mellitus with foot ulcer: Secondary | ICD-10-CM | POA: Diagnosis not present

## 2017-12-23 DIAGNOSIS — M8668 Other chronic osteomyelitis, other site: Secondary | ICD-10-CM | POA: Diagnosis not present

## 2017-12-23 DIAGNOSIS — L97519 Non-pressure chronic ulcer of other part of right foot with unspecified severity: Secondary | ICD-10-CM | POA: Diagnosis not present
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
## 2017-12-27 ENCOUNTER — Inpatient Hospital Stay: Payer: Managed Care, Other (non HMO)

## 2017-12-27 ENCOUNTER — Inpatient Hospital Stay: Payer: Managed Care, Other (non HMO) | Attending: Hematology

## 2017-12-27 VITALS — BP 138/82 | HR 82

## 2017-12-27 DIAGNOSIS — N189 Chronic kidney disease, unspecified: Secondary | ICD-10-CM | POA: Insufficient documentation

## 2017-12-27 DIAGNOSIS — D631 Anemia in chronic kidney disease: Secondary | ICD-10-CM | POA: Diagnosis not present

## 2017-12-27 DIAGNOSIS — D638 Anemia in other chronic diseases classified elsewhere: Secondary | ICD-10-CM

## 2017-12-27 DIAGNOSIS — E1122 Type 2 diabetes mellitus with diabetic chronic kidney disease: Secondary | ICD-10-CM | POA: Insufficient documentation

## 2017-12-27 DIAGNOSIS — I129 Hypertensive chronic kidney disease with stage 1 through stage 4 chronic kidney disease, or unspecified chronic kidney disease: Secondary | ICD-10-CM | POA: Insufficient documentation

## 2017-12-27 LAB — CBC WITH DIFFERENTIAL/PLATELET
BASOS ABS: 0 10*3/uL (ref 0.0–0.1)
BASOS PCT: 1 %
EOS ABS: 0.2 10*3/uL (ref 0.0–0.5)
EOS PCT: 4 %
HCT: 31.6 % — ABNORMAL LOW (ref 38.4–49.9)
Hemoglobin: 9.9 g/dL — ABNORMAL LOW (ref 13.0–17.1)
Lymphocytes Relative: 28 %
Lymphs Abs: 1.3 10*3/uL (ref 0.9–3.3)
MCH: 24.4 pg — ABNORMAL LOW (ref 27.2–33.4)
MCHC: 31.2 g/dL — ABNORMAL LOW (ref 32.0–36.0)
MCV: 78.3 fL — ABNORMAL LOW (ref 79.3–98.0)
MONO ABS: 0.3 10*3/uL (ref 0.1–0.9)
Monocytes Relative: 7 %
NEUTROS ABS: 2.9 10*3/uL (ref 1.5–6.5)
Neutrophils Relative %: 60 %
PLATELETS: 252 10*3/uL (ref 140–400)
RBC: 4.04 MIL/uL — ABNORMAL LOW (ref 4.20–5.82)
RDW: 15.7 % — AB (ref 11.0–14.6)
WBC: 4.7 10*3/uL (ref 4.0–10.3)

## 2017-12-27 MED ORDER — DARBEPOETIN ALFA 200 MCG/0.4ML IJ SOSY
150.0000 ug | PREFILLED_SYRINGE | Freq: Once | INTRAMUSCULAR | Status: AC
  Administered 2017-12-27: 150 ug via SUBCUTANEOUS

## 2017-12-27 NOTE — Patient Instructions (Signed)

## 2017-12-28 ENCOUNTER — Ambulatory Visit: Payer: Managed Care, Other (non HMO) | Attending: Orthopedic Surgery | Admitting: Physical Therapy

## 2017-12-28 DIAGNOSIS — R2681 Unsteadiness on feet: Secondary | ICD-10-CM | POA: Diagnosis present

## 2017-12-28 DIAGNOSIS — R2689 Other abnormalities of gait and mobility: Secondary | ICD-10-CM | POA: Diagnosis not present

## 2017-12-28 DIAGNOSIS — M6281 Muscle weakness (generalized): Secondary | ICD-10-CM | POA: Diagnosis present
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
## 2017-12-30 ENCOUNTER — Encounter
Payer: Managed Care, Other (non HMO) | Attending: Physical Medicine & Rehabilitation | Admitting: Physical Medicine & Rehabilitation

## 2017-12-30 ENCOUNTER — Encounter: Payer: Self-pay | Admitting: Physical Medicine & Rehabilitation

## 2017-12-30 VITALS — BP 165/94 | HR 90 | Ht 71.5 in | Wt 160.0 lb

## 2017-12-30 DIAGNOSIS — I129 Hypertensive chronic kidney disease with stage 1 through stage 4 chronic kidney disease, or unspecified chronic kidney disease: Secondary | ICD-10-CM | POA: Diagnosis not present

## 2017-12-30 DIAGNOSIS — I96 Gangrene, not elsewhere classified: Secondary | ICD-10-CM | POA: Diagnosis not present

## 2017-12-30 DIAGNOSIS — N179 Acute kidney failure, unspecified: Secondary | ICD-10-CM | POA: Diagnosis not present

## 2017-12-30 DIAGNOSIS — Z91199 Patient's noncompliance with other medical treatment and regimen due to unspecified reason: Secondary | ICD-10-CM

## 2017-12-30 DIAGNOSIS — Z9119 Patient's noncompliance with other medical treatment and regimen: Secondary | ICD-10-CM

## 2017-12-30 DIAGNOSIS — S88112S Complete traumatic amputation at level between knee and ankle, left lower leg, sequela: Secondary | ICD-10-CM

## 2017-12-30 DIAGNOSIS — E1151 Type 2 diabetes mellitus with diabetic peripheral angiopathy without gangrene: Secondary | ICD-10-CM | POA: Insufficient documentation

## 2017-12-30 DIAGNOSIS — Z4781 Encounter for orthopedic aftercare following surgical amputation: Secondary | ICD-10-CM | POA: Diagnosis present

## 2017-12-30 DIAGNOSIS — N183 Chronic kidney disease, stage 3 (moderate): Secondary | ICD-10-CM | POA: Insufficient documentation

## 2017-12-30 DIAGNOSIS — E1122 Type 2 diabetes mellitus with diabetic chronic kidney disease: Secondary | ICD-10-CM | POA: Insufficient documentation

## 2017-12-30 DIAGNOSIS — I10 Essential (primary) hypertension: Secondary | ICD-10-CM

## 2017-12-30 DIAGNOSIS — Z8249 Family history of ischemic heart disease and other diseases of the circulatory system: Secondary | ICD-10-CM | POA: Insufficient documentation

## 2017-12-30 DIAGNOSIS — E1142 Type 2 diabetes mellitus with diabetic polyneuropathy: Secondary | ICD-10-CM | POA: Diagnosis not present

## 2017-12-30 DIAGNOSIS — I429 Cardiomyopathy, unspecified: Secondary | ICD-10-CM | POA: Diagnosis not present

## 2017-12-30 DIAGNOSIS — Z803 Family history of malignant neoplasm of breast: Secondary | ICD-10-CM | POA: Diagnosis not present

## 2017-12-30 DIAGNOSIS — Z8042 Family history of malignant neoplasm of prostate: Secondary | ICD-10-CM | POA: Diagnosis not present

## 2017-12-30 DIAGNOSIS — K5900 Constipation, unspecified: Secondary | ICD-10-CM | POA: Insufficient documentation

## 2017-12-30 DIAGNOSIS — K5901 Slow transit constipation: Secondary | ICD-10-CM

## 2017-12-30 DIAGNOSIS — Z89512 Acquired absence of left leg below knee: Secondary | ICD-10-CM

## 2017-12-30 DIAGNOSIS — Z87891 Personal history of nicotine dependence: Secondary | ICD-10-CM | POA: Insufficient documentation

## 2017-12-30 DIAGNOSIS — S88112A Complete traumatic amputation at level between knee and ankle, left lower leg, initial encounter: Secondary | ICD-10-CM

## 2017-12-30 DIAGNOSIS — Z9889 Other specified postprocedural states: Secondary | ICD-10-CM | POA: Diagnosis not present

## 2017-12-30 DIAGNOSIS — G8918 Other acute postprocedural pain: Secondary | ICD-10-CM | POA: Diagnosis not present

## 2017-12-30 DIAGNOSIS — R269 Unspecified abnormalities of gait and mobility: Secondary | ICD-10-CM
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
## 2017-12-31 ENCOUNTER — Ambulatory Visit: Payer: Managed Care, Other (non HMO) | Admitting: Physical Therapy

## 2017-12-31 ENCOUNTER — Encounter: Payer: Self-pay | Admitting: Physical Therapy

## 2017-12-31 DIAGNOSIS — M6281 Muscle weakness (generalized): Secondary | ICD-10-CM

## 2017-12-31 DIAGNOSIS — R2689 Other abnormalities of gait and mobility: Secondary | ICD-10-CM

## 2017-12-31 DIAGNOSIS — R2681 Unsteadiness on feet: Secondary | ICD-10-CM
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
## 2018-01-03 ENCOUNTER — Encounter (INDEPENDENT_AMBULATORY_CARE_PROVIDER_SITE_OTHER): Payer: Self-pay | Admitting: Orthopedic Surgery

## 2018-01-03 ENCOUNTER — Ambulatory Visit (INDEPENDENT_AMBULATORY_CARE_PROVIDER_SITE_OTHER): Payer: Managed Care, Other (non HMO) | Admitting: Orthopedic Surgery

## 2018-01-03 VITALS — Ht 71.0 in | Wt 160.0 lb

## 2018-01-03 DIAGNOSIS — L97519 Non-pressure chronic ulcer of other part of right foot with unspecified severity: Secondary | ICD-10-CM
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
## 2018-01-04 ENCOUNTER — Ambulatory Visit: Payer: Managed Care, Other (non HMO) | Admitting: Physical Therapy

## 2018-01-04 ENCOUNTER — Encounter: Payer: Self-pay | Admitting: Vascular Surgery

## 2018-01-04 ENCOUNTER — Ambulatory Visit (INDEPENDENT_AMBULATORY_CARE_PROVIDER_SITE_OTHER): Payer: Managed Care, Other (non HMO) | Admitting: Vascular Surgery

## 2018-01-04 ENCOUNTER — Other Ambulatory Visit: Payer: Self-pay

## 2018-01-04 ENCOUNTER — Other Ambulatory Visit: Payer: Self-pay | Admitting: *Deleted

## 2018-01-04 ENCOUNTER — Encounter: Payer: Self-pay | Admitting: *Deleted

## 2018-01-04 VITALS — BP 140/83 | HR 86 | Temp 97.9°F | Resp 20 | Ht 71.0 in | Wt 159.6 lb

## 2018-01-04 DIAGNOSIS — I70235 Atherosclerosis of native arteries of right leg with ulceration of other part of foot: Secondary | ICD-10-CM
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
## 2018-01-05 ENCOUNTER — Other Ambulatory Visit: Payer: Self-pay

## 2018-01-05 ENCOUNTER — Ambulatory Visit (HOSPITAL_COMMUNITY)
Admission: RE | Disposition: A | Discharge status: Home / Self Care | Payer: Self-pay | Source: Ambulatory Visit | Attending: Vascular Surgery

## 2018-01-05 ENCOUNTER — Ambulatory Visit (HOSPITAL_COMMUNITY)
Admission: RE | Admit: 2018-01-05 | Discharge: 2018-01-05 | Disposition: A | Discharge status: Home / Self Care | Payer: Managed Care, Other (non HMO) | Source: Ambulatory Visit | Attending: Vascular Surgery | Admitting: Vascular Surgery

## 2018-01-05 DIAGNOSIS — Z794 Long term (current) use of insulin: Secondary | ICD-10-CM | POA: Insufficient documentation

## 2018-01-05 DIAGNOSIS — I129 Hypertensive chronic kidney disease with stage 1 through stage 4 chronic kidney disease, or unspecified chronic kidney disease: Secondary | ICD-10-CM | POA: Diagnosis not present

## 2018-01-05 DIAGNOSIS — Z79899 Other long term (current) drug therapy: Secondary | ICD-10-CM | POA: Diagnosis not present

## 2018-01-05 DIAGNOSIS — Z87891 Personal history of nicotine dependence: Secondary | ICD-10-CM | POA: Diagnosis not present

## 2018-01-05 DIAGNOSIS — Z841 Family history of disorders of kidney and ureter: Secondary | ICD-10-CM | POA: Insufficient documentation

## 2018-01-05 DIAGNOSIS — Z8249 Family history of ischemic heart disease and other diseases of the circulatory system: Secondary | ICD-10-CM | POA: Insufficient documentation

## 2018-01-05 DIAGNOSIS — Z91018 Allergy to other foods: Secondary | ICD-10-CM | POA: Insufficient documentation

## 2018-01-05 DIAGNOSIS — I70235 Atherosclerosis of native arteries of right leg with ulceration of other part of foot: Secondary | ICD-10-CM | POA: Diagnosis not present

## 2018-01-05 DIAGNOSIS — L97511 Non-pressure chronic ulcer of other part of right foot limited to breakdown of skin: Secondary | ICD-10-CM | POA: Insufficient documentation

## 2018-01-05 DIAGNOSIS — Z91013 Allergy to seafood: Secondary | ICD-10-CM | POA: Insufficient documentation

## 2018-01-05 DIAGNOSIS — N189 Chronic kidney disease, unspecified: Secondary | ICD-10-CM | POA: Insufficient documentation

## 2018-01-05 DIAGNOSIS — E1122 Type 2 diabetes mellitus with diabetic chronic kidney disease: Secondary | ICD-10-CM | POA: Diagnosis not present

## 2018-01-05 DIAGNOSIS — I429 Cardiomyopathy, unspecified: Secondary | ICD-10-CM | POA: Diagnosis not present

## 2018-01-05 DIAGNOSIS — Z823 Family history of stroke: Secondary | ICD-10-CM | POA: Diagnosis not present

## 2018-01-05 DIAGNOSIS — I998 Other disorder of circulatory system: Secondary | ICD-10-CM | POA: Insufficient documentation

## 2018-01-05 DIAGNOSIS — Z89512 Acquired absence of left leg below knee: Secondary | ICD-10-CM | POA: Insufficient documentation

## 2018-01-05 DIAGNOSIS — E785 Hyperlipidemia, unspecified: Secondary | ICD-10-CM | POA: Insufficient documentation

## 2018-01-05 DIAGNOSIS — E11621 Type 2 diabetes mellitus with foot ulcer: Secondary | ICD-10-CM | POA: Diagnosis not present

## 2018-01-05 DIAGNOSIS — I70238 Atherosclerosis of native arteries of right leg with ulceration of other part of lower right leg: Secondary | ICD-10-CM | POA: Diagnosis not present

## 2018-01-05 HISTORY — PX: LOWER EXTREMITY ANGIOGRAPHY: CATH118251

## 2018-01-05 HISTORY — PX: PERIPHERAL VASCULAR BALLOON ANGIOPLASTY: CATH118281

## 2018-01-05 LAB — GLUCOSE, CAPILLARY
GLUCOSE-CAPILLARY: 153 mg/dL — AB (ref 65–99)
GLUCOSE-CAPILLARY: 111 mg/dL — AB (ref 65–99)

## 2018-01-05 LAB — POCT I-STAT, CHEM 8
BUN: 22 mg/dL — AB (ref 6–20)
CHLORIDE: 104 mmol/L (ref 101–111)
Calcium, Ion: 1.07 mmol/L — ABNORMAL LOW (ref 1.15–1.40)
Creatinine, Ser: 1.8 mg/dL — ABNORMAL HIGH (ref 0.61–1.24)
Glucose, Bld: 162 mg/dL — ABNORMAL HIGH (ref 65–99)
HCT: 37 % — ABNORMAL LOW (ref 39.0–52.0)
Hemoglobin: 12.6 g/dL — ABNORMAL LOW (ref 13.0–17.0)
POTASSIUM: 3.6 mmol/L (ref 3.5–5.1)
Sodium: 140 mmol/L (ref 135–145)
TCO2: 27 mmol/L (ref 22–32)

## 2018-01-05 SURGERY — LOWER EXTREMITY ANGIOGRAPHY
Anesthesia: LOCAL | Laterality: Right

## 2018-01-05 MED ORDER — CLOPIDOGREL BISULFATE 75 MG PO TABS: 75.0000 mg | ORAL_TABLET | Freq: Every day | ORAL | 11 refills | Status: AC

## 2018-01-05 MED ORDER — FENTANYL CITRATE (PF) 100 MCG/2ML IJ SOLN
INTRAMUSCULAR | Status: DC | PRN
  Administered 2018-01-05: 50 ug via INTRAVENOUS

## 2018-01-05 MED ORDER — HEPARIN (PORCINE) IN NACL 2-0.9 UNIT/ML-% IJ SOLN
INTRAMUSCULAR | Status: AC | PRN
  Administered 2018-01-05 (×2): 500 mL

## 2018-01-05 MED ORDER — MORPHINE SULFATE (PF) 10 MG/ML IV SOLN: 2.0000 mg | INTRAVENOUS | Status: DC | PRN

## 2018-01-05 MED ORDER — ONDANSETRON HCL 4 MG/2ML IJ SOLN
4.0000 mg | Freq: Four times a day (QID) | INTRAMUSCULAR | Status: DC | PRN
  Administered 2018-01-05: 4 mg via INTRAVENOUS

## 2018-01-05 MED ORDER — ACETAMINOPHEN 325 MG PO TABS: 650.0000 mg | ORAL_TABLET | ORAL | Status: DC | PRN

## 2018-01-05 MED ORDER — HEPARIN SODIUM (PORCINE) 1000 UNIT/ML IJ SOLN
INTRAMUSCULAR | Status: AC
  Filled 2018-01-05: qty 1

## 2018-01-05 MED ORDER — MIDAZOLAM HCL 2 MG/2ML IJ SOLN
INTRAMUSCULAR | Status: DC | PRN
  Administered 2018-01-05: 1 mg via INTRAVENOUS

## 2018-01-05 MED ORDER — ONDANSETRON HCL 4 MG/2ML IJ SOLN
INTRAMUSCULAR | Status: AC
  Filled 2018-01-05: qty 2

## 2018-01-05 MED ORDER — OXYCODONE HCL 5 MG PO TABS
ORAL_TABLET | ORAL | Status: AC
  Filled 2018-01-05: qty 1

## 2018-01-05 MED ORDER — SODIUM CHLORIDE 0.9 % IV SOLN
INTRAVENOUS | Status: DC
  Administered 2018-01-05: 13:00:00 via INTRAVENOUS

## 2018-01-05 MED ORDER — CLOPIDOGREL BISULFATE 75 MG PO TABS: 75.0000 mg | ORAL_TABLET | Freq: Every day | ORAL | Status: DC

## 2018-01-05 MED ORDER — MIDAZOLAM HCL 2 MG/2ML IJ SOLN
INTRAMUSCULAR | Status: AC
  Filled 2018-01-05: qty 2

## 2018-01-05 MED ORDER — HYDRALAZINE HCL 20 MG/ML IJ SOLN: 5.0000 mg | INTRAMUSCULAR | Status: DC | PRN

## 2018-01-05 MED ORDER — SODIUM CHLORIDE 0.9 % WEIGHT BASED INFUSION: 1.0000 mL/kg/h | INTRAVENOUS | Status: DC

## 2018-01-05 MED ORDER — LABETALOL HCL 5 MG/ML IV SOLN: 10.0000 mg | INTRAVENOUS | Status: DC | PRN

## 2018-01-05 MED ORDER — HEPARIN SODIUM (PORCINE) 1000 UNIT/ML IJ SOLN
INTRAMUSCULAR | Status: DC | PRN
  Administered 2018-01-05: 7000 [IU] via INTRAVENOUS

## 2018-01-05 MED ORDER — OXYCODONE HCL 5 MG PO TABS
5.0000 mg | ORAL_TABLET | ORAL | Status: DC | PRN
  Administered 2018-01-05: 5 mg via ORAL

## 2018-01-05 MED ORDER — SODIUM CHLORIDE 0.9% FLUSH: 3.0000 mL | Freq: Two times a day (BID) | INTRAVENOUS | Status: DC

## 2018-01-05 MED ORDER — HEPARIN (PORCINE) IN NACL 2-0.9 UNIT/ML-% IJ SOLN
INTRAMUSCULAR | Status: AC
  Filled 2018-01-05: qty 1000

## 2018-01-05 MED ORDER — FENTANYL CITRATE (PF) 100 MCG/2ML IJ SOLN
INTRAMUSCULAR | Status: AC
  Filled 2018-01-05: qty 2

## 2018-01-05 MED ORDER — SODIUM CHLORIDE 0.9% FLUSH: 3.0000 mL | INTRAVENOUS | Status: DC | PRN

## 2018-01-05 MED ORDER — CLOPIDOGREL BISULFATE 300 MG PO TABS
ORAL_TABLET | ORAL | Status: AC
  Filled 2018-01-05: qty 1

## 2018-01-05 MED ORDER — IODIXANOL 320 MG/ML IV SOLN
INTRAVENOUS | Status: DC | PRN
  Administered 2018-01-05: 30 mL via INTRA_ARTERIAL

## 2018-01-05 MED ORDER — CLOPIDOGREL BISULFATE 300 MG PO TABS
ORAL_TABLET | ORAL | Status: DC | PRN
  Administered 2018-01-05: 300 mg via ORAL

## 2018-01-05 MED ORDER — LIDOCAINE HCL 1 % IJ SOLN
INTRAMUSCULAR | Status: AC
  Filled 2018-01-05: qty 20

## 2018-01-05 MED ORDER — LIDOCAINE HCL (PF) 1 % IJ SOLN
INTRAMUSCULAR | Status: DC | PRN
  Administered 2018-01-05: 15 mL

## 2018-01-05 MED ORDER — SODIUM CHLORIDE 0.9 % IV SOLN: 250.0000 mL | INTRAVENOUS | Status: DC | PRN

## 2018-01-05 SURGICAL SUPPLY — 27 items
BALLN ADMIRAL INPACT 5X250 (BALLOONS) ×3
BALLN MUSTANG 5X200X135 (BALLOONS) ×3
BALLOON ADMIRAL INPACT 5X250 (BALLOONS) IMPLANT
BALLOON MUSTANG 5X200X135 (BALLOONS) IMPLANT
CATH OMNI FLUSH 5F 65CM (CATHETERS) ×1 IMPLANT
CATH QUICKCROSS SUPP .035X90CM (MICROCATHETER) ×1 IMPLANT
CATH TEMPO 5F RIM 65CM (CATHETERS) IMPLANT
COVER PRB 48X5XTLSCP FOLD TPE (BAG) IMPLANT
COVER PROBE 5X48 (BAG) ×3
DEVICE CLOSURE MYNXGRIP 6/7F (Vascular Products) ×1 IMPLANT
DEVICE TORQUE .025-.038 (MISCELLANEOUS) ×1 IMPLANT
FILTER CO2 0.2 MICRON (VASCULAR PRODUCTS) ×1 IMPLANT
GUIDEWIRE ANGLED .035X260CM (WIRE) ×1 IMPLANT
KIT ENCORE 26 ADVANTAGE (KITS) ×1 IMPLANT
KIT MICROPUNCTURE NIT STIFF (SHEATH) ×1 IMPLANT
KIT PV (KITS) ×3 IMPLANT
RESERVOIR CO2 (VASCULAR PRODUCTS) ×1 IMPLANT
SET FLUSH CO2 (MISCELLANEOUS) ×1 IMPLANT
SHEATH AVANTI 11CM 5FR (SHEATH) ×1 IMPLANT
SHEATH AVANTI 11CM 7FR (SHEATH) ×1 IMPLANT
SHEATH FLEXOR ANSEL 1 7F 45CM (SHEATH) ×1 IMPLANT
SHIELD RADPAD SCOOP 12X17 (MISCELLANEOUS) ×1 IMPLANT
SYR MEDRAD MARK V 150ML (SYRINGE) ×3 IMPLANT
TRANSDUCER W/STOPCOCK (MISCELLANEOUS) ×3 IMPLANT
TRAY PV CATH (CUSTOM PROCEDURE TRAY) ×3 IMPLANT
WIRE BENTSON .035X145CM (WIRE) ×1 IMPLANT
WIRE ROSEN-J .035X260CM (WIRE) ×1 IMPLANT

## 2018-01-05 NOTE — Progress Notes (Signed)
Pt has had pain at left groin site. Level 0, pain med given with reduction of pain accomplished, pt c/o nausea, zofran given and was better for awhile but when getting him up for DC he had emesis x 1, pt states he feels better now. Will continue to DC

## 2018-01-05 NOTE — Discharge Instructions (Signed)

## 2018-01-05 NOTE — H&P (Signed)
   History and Physical Update  The patient was interviewed and re-examined.  The patient's previous History and Physical has been reviewed and is unchanged from recent office visit. Plan for right lower extremity angiogram with possible intervention.  Brandon C. Randie Heinz, MD [facility] [place] Office: [phone] Pager: [phone]   01/05/2018, 1:53 PM

## 2018-01-05 NOTE — Op Note (Signed)
    Patient name: Keith Guzman MRN: [id number] DOB: 1961/01/04 Sex: male  01/05/2018 Pre-operative Diagnosis: Critical right lower extremity ischemia with great toe ulceration Post-operative diagnosis:  Same Surgeon:  Apolinar Junes C. Randie Heinz, MD Procedure Performed: 1.  Ultrasound-guided cannulation left common femoral artery 2.  Aortogram with right lower extremity runoff with CO2 and contrast 3.  Drug-coated balloon angioplasty right SFA with 5 x 250 mm in pact Admiral 4.  Percutaneous closure of left common femoral artery with minx device 5.  Moderate sedation with fentanyl and Versed for 57 minutes  Indications: 57 year old male with a history of a left below-knee amputation.  He now has great toe ulceration on the right and is indicated for angiogram with possible intervention.  He also has chronic kidney disease for which we will use CO2.  Findings: The aorta and iliac segments appear free of disease.  On the right the SFA is occluded in the midsegment there is also an area of 80% stenosis.  He has two-vessel runoff to the foot via the right peroneal and posterior tibial arteries with the posterior tibial being the dominant vessel.  Following intervention there is 0% residual stenosis and no flow-limiting dissection in the SFA   Procedure:  The patient was identified in the holding area and taken to room 8.  The patient was then placed supine on the table and prepped and draped in the usual sterile fashion.  A time out was called.  Ultrasound was used to evaluate the left common femoral artery.  It was patent .  A digital ultrasound image was acquired.  A micropuncture needle was used to access the left common femoral artery under ultrasound guidance.  An 018 wire was advanced without resistance and a micropuncture sheath was placed.  The 018 wire was removed and a benson wire was placed.  The micropuncture sheath was exchanged for a 5 french sheath.  An omniflush catheter was advanced over the wire to  the level of L-1.  An abdominal angiogram was obtained with CO2 and we then crossed the bifurcation perform right lower extremity angiogram with CO2 to the level of the tibials we can no longer see.  We then performed angiogram with contrast and identified our occlusive disease in her SFA.  We then placed a stiff wire to the SFA and exchanged for a long 7 French sheath and the patient was heparinized.  We then cross the occluded SFA with a Glidewire and quick cross catheter and confirmed intraluminal access.  We then performed balloon angioplasty with a 5 mm balloon of the entire SFA followed by drug-coated balloon angioplasty with a 5 x 250 mm in pact Admiral.  Completion angiogram demonstrated no residual stenosis and no flow-limiting dissection and there was still runoff via 2 vessels with the posterior tibial vessel being the dominant flow to the foot and filling the foot.  Satisfied with this we exchanged for a short 7 French sheath and then deployed a minx device to close the common femoral site.  He tolerated procedure well without immediate complication and contrast was limited.  Next  Contrast: 30 cc.   Raya Mckinstry C. Randie Heinz, MD [facility] [place] Office: [phone] Pager: [phone]

## 2018-01-06 ENCOUNTER — Encounter (HOSPITAL_COMMUNITY): Payer: Self-pay | Admitting: Vascular Surgery

## 2018-01-06 ENCOUNTER — Encounter: Payer: Managed Care, Other (non HMO) | Admitting: Physical Therapy

## 2018-01-06 ENCOUNTER — Telehealth: Payer: Self-pay | Admitting: Vascular Surgery

## 2018-01-06 MED FILL — Heparin Sodium (Porcine) 2 Unit/ML in Sodium Chloride 0.9%: INTRAMUSCULAR | Qty: 1000 | Status: AC

## 2018-01-06 MED FILL — Lidocaine HCl Local Inj 1%: INTRAMUSCULAR | Qty: 20 | Status: AC

## 2018-01-06 NOTE — Telephone Encounter (Signed)
Spoke to spouse for appt on 5/14 [place] x2 & OV  [facility]ld lttr

## 2018-01-06 NOTE — Telephone Encounter (Signed)
-----   Message from Sharee Pimple, RN sent at 01/06/2018 10:51 AM EDT ----- Regarding: 4 weeks w/ 2 labs   ----- Message ----- From: Maeola Harman, MD Sent: 01/05/2018   6:02 PM To: [facility] [address]  Keith Guzman [id number] 1961/07/13  01/05/2018 Pre-operative Diagnosis: Critical right lower extremity ischemia with great toe ulceration  Surgeon:  Apolinar Junes C. Randie Heinz, MD  Procedure Performed: 1.  Ultrasound-guided cannulation left common femoral artery 2.  Aortogram with right lower extremity runoff with CO2 and contrast 3.  Drug-coated balloon angioplasty right SFA with 5 x 250 mm in pact Admiral 4.  Percutaneous closure of left common femoral artery with minx device 5.  Moderate sedation with fentanyl and Versed for 57 minutes   F/u Dr. Arbie Cookey in 4 weeks with RLE duplex and ABI

## 2018-01-07 ENCOUNTER — Emergency Department (HOSPITAL_COMMUNITY)
Admission: EM | Admit: 2018-01-07 | Discharge: 2018-01-26 | Disposition: E | Payer: Managed Care, Other (non HMO) | Attending: Emergency Medicine | Admitting: Emergency Medicine

## 2018-01-07 ENCOUNTER — Emergency Department (HOSPITAL_COMMUNITY): Payer: Managed Care, Other (non HMO)

## 2018-01-07 DIAGNOSIS — R579 Shock, unspecified: Secondary | ICD-10-CM

## 2018-01-07 DIAGNOSIS — I129 Hypertensive chronic kidney disease with stage 1 through stage 4 chronic kidney disease, or unspecified chronic kidney disease: Secondary | ICD-10-CM | POA: Diagnosis not present

## 2018-01-07 DIAGNOSIS — G931 Anoxic brain damage, not elsewhere classified: Secondary | ICD-10-CM

## 2018-01-07 DIAGNOSIS — Z7902 Long term (current) use of antithrombotics/antiplatelets: Secondary | ICD-10-CM | POA: Diagnosis not present

## 2018-01-07 DIAGNOSIS — N183 Chronic kidney disease, stage 3 (moderate): Secondary | ICD-10-CM | POA: Diagnosis not present

## 2018-01-07 DIAGNOSIS — Z794 Long term (current) use of insulin: Secondary | ICD-10-CM | POA: Insufficient documentation

## 2018-01-07 DIAGNOSIS — I469 Cardiac arrest, cause unspecified: Secondary | ICD-10-CM | POA: Diagnosis not present

## 2018-01-07 DIAGNOSIS — E1122 Type 2 diabetes mellitus with diabetic chronic kidney disease: Secondary | ICD-10-CM | POA: Insufficient documentation

## 2018-01-07 DIAGNOSIS — Z7982 Long term (current) use of aspirin: Secondary | ICD-10-CM | POA: Diagnosis not present

## 2018-01-07 DIAGNOSIS — Z87891 Personal history of nicotine dependence: Secondary | ICD-10-CM | POA: Insufficient documentation

## 2018-01-07 DIAGNOSIS — J9601 Acute respiratory failure with hypoxia: Secondary | ICD-10-CM | POA: Diagnosis not present

## 2018-01-07 LAB — I-STAT TROPONIN, ED: Troponin i, poc: 0.02 ng/mL (ref 0.00–0.08)

## 2018-01-07 LAB — CBC WITH DIFFERENTIAL/PLATELET
BAND NEUTROPHILS: 5 %
BASOS PCT: 0 %
Basophils Absolute: 0 10*3/uL (ref 0.0–0.1)
Blasts: 0 %
EOS ABS: 0 10*3/uL (ref 0.0–0.7)
EOS PCT: 0 %
HCT: 21.8 % — ABNORMAL LOW (ref 39.0–52.0)
Hemoglobin: 6.7 g/dL — CL (ref 13.0–17.0)
LYMPHS PCT: 21 %
Lymphs Abs: 3.7 10*3/uL (ref 0.7–4.0)
MCH: 25.1 pg — AB (ref 26.0–34.0)
MCHC: 30.7 g/dL (ref 30.0–36.0)
MCV: 81.6 fL (ref 78.0–100.0)
MONO ABS: 0.5 10*3/uL (ref 0.1–1.0)
Metamyelocytes Relative: 0 %
Monocytes Relative: 3 %
Myelocytes: 0 %
NRBC: 0 /100{WBCs}
Neutro Abs: 13.3 10*3/uL — ABNORMAL HIGH (ref 1.7–7.7)
Neutrophils Relative %: 71 %
OTHER: 0 %
PLATELETS: 144 10*3/uL — AB (ref 150–400)
PROMYELOCYTES RELATIVE: 0 %
RBC: 2.67 MIL/uL — ABNORMAL LOW (ref 4.22–5.81)
RDW: 17.5 % — AB (ref 11.5–15.5)
WBC: 17.5 10*3/uL — ABNORMAL HIGH (ref 4.0–10.5)

## 2018-01-07 LAB — COMPREHENSIVE METABOLIC PANEL
ALBUMIN: 2.2 g/dL — AB (ref 3.5–5.0)
ALK PHOS: 64 U/L (ref 38–126)
ALT: 1717 U/L — AB (ref 17–63)
ANION GAP: 27 — AB (ref 5–15)
AST: 1243 U/L — ABNORMAL HIGH (ref 15–41)
BUN: 33 mg/dL — ABNORMAL HIGH (ref 6–20)
CALCIUM: 7.7 mg/dL — AB (ref 8.9–10.3)
CHLORIDE: 106 mmol/L (ref 101–111)
CO2: 16 mmol/L — AB (ref 22–32)
CREATININE: 4.04 mg/dL — AB (ref 0.61–1.24)
GFR calc Af Amer: 17 mL/min — ABNORMAL LOW (ref >60)
GFR calc non Af Amer: 15 mL/min — ABNORMAL LOW (ref >60)
GLUCOSE: 169 mg/dL — AB (ref 65–99)
Potassium: 3.4 mmol/L — ABNORMAL LOW (ref 3.5–5.1)
SODIUM: 149 mmol/L — AB (ref 135–145)
Total Bilirubin: 1.2 mg/dL (ref 0.3–1.2)
Total Protein: 4.5 g/dL — ABNORMAL LOW (ref 6.5–8.1)

## 2018-01-07 LAB — PROTIME-INR
INR: 1.73
PROTHROMBIN TIME: 20.1 s — AB (ref 11.4–15.2)

## 2018-01-07 LAB — I-STAT CG4 LACTIC ACID, ED: Lactic Acid, Venous: 15.29 mmol/L (ref 0.5–1.9)

## 2018-01-07 MED ORDER — EPINEPHRINE PF 1 MG/10ML IJ SOSY
PREFILLED_SYRINGE | INTRAMUSCULAR | Status: AC | PRN
  Administered 2018-01-07 (×3): 1 via INTRAVENOUS

## 2018-01-07 MED ORDER — SODIUM BICARBONATE 8.4 % IV SOLN
INTRAVENOUS | Status: AC | PRN
  Administered 2018-01-07: 100 meq via INTRAVENOUS

## 2018-01-07 MED ORDER — EPINEPHRINE PF 1 MG/ML IJ SOLN
0.5000 ug/min | INTRAMUSCULAR | Status: DC
  Filled 2018-01-07: qty 4

## 2018-01-10 ENCOUNTER — Other Ambulatory Visit: Payer: Self-pay

## 2018-01-11 ENCOUNTER — Telehealth: Payer: Self-pay

## 2018-01-11 NOTE — Telephone Encounter (Signed)
Wife called to confirm her husband death. Per 02-Feb-2023 phon emessage

## 2018-01-24 ENCOUNTER — Other Ambulatory Visit: Payer: Managed Care, Other (non HMO)

## 2018-01-24 ENCOUNTER — Ambulatory Visit: Payer: Managed Care, Other (non HMO) | Admitting: Hematology

## 2018-01-24 ENCOUNTER — Ambulatory Visit: Payer: Managed Care, Other (non HMO)

## 2018-01-26 NOTE — Code Documentation (Signed)
Patient time of death occurred at 45 by Dr. Freida Busman
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
## 2018-01-26 NOTE — ED Notes (Signed)
Pt extubated by respiratory therapist per verbal order from Dr. Freida Busman. RN and MD present as well. Pt remains in PEA

## 2018-01-26 NOTE — Consult Note (Signed)
Called to evaluate patient s/p cardiac arrest.  Chart reviewed at length.  Patient is a 57 year old male with a very extensive PMH who very recently had a femoral artery surgery on the left leg that had a previous healed amputation.  Patient was found by family unresponsive and subsequently was noted to be in PEA.  Patient was treated for that for 30-45 minutes piror to arrival.  Had a few episodes of VT.  Upon arrival to the ED, patient was completely unresponsive and patient was intubated in the ED from a king airway to a standard ETT.  PCCM was called to question need for hypothermia and to admit.  On exam, patient had a respiratory drive with a rate of one breath every 15 seconds.  No dolls eye, pupillary, corneal or gag reflex.  Pupils were fixed and dilated.  BS were bilateral with the vent.  Patient was in severe shock with SBP of 50 on an epi drip.  No evidence of wounds beyond surgical and previous scar and a left BKA amputation.  Patient was completely unresponsive even to deep stimuli.  While being evaluated the patient developed PEA again and CPR was started again.    At that point, resuscitative efforts were on going for approximately 90 minutes.  I had a conversation with Dr. Freida Busman, recommended that further aggressive interventions here are unlikely to result in any meaningful recovery even in the off chance that we establish ROSC.  Dr. Freida Busman was in agreement.  PCCM departed the room with the understanding that if ROSC is achieved that PCCM is called back.    Check on patient later on the day and evidently resuscitative efforts rightly were withheld as ROSC was no longer achievable in a reasonable period of time.  PCCM will sign off.  The patient is critically ill with multiple organ systems failure and requires high complexity decision making for assessment and support, frequent evaluation and titration of therapies, application of advanced monitoring technologies and extensive  interpretation of multiple databases.   Critical Care Time devoted to patient care services described in this note is  35  Minutes. This time reflects time of care of this signee Dr Koren Bound. This critical care time does not reflect procedure time, or teaching time or supervisory time of PA/NP/Med student/Med Resident etc but could involve care discussion time.  Alyson Reedy, M.D. [facility] Pulmonary/Critical Care Medicine. Pager: [phone]. After hours pager: [phone].

## 2018-01-26 NOTE — ED Triage Notes (Signed)
Pt in as CPR via [facility], found unrepsonsive in PEA. 60 min CPR PTA. LSN at 0300. Given 4 Epi's, defib x 1, lost pulses x 3 en route. CBG 180, given 700 ml's NS. King airway in place on arrival, CPR in progress

## 2018-01-26 NOTE — Code Documentation (Addendum)
Pt remains in PEA and on ventilator.

## 2018-01-26 NOTE — ED Notes (Signed)
Pt transported to [facility] 

## 2018-01-26 NOTE — Progress Notes (Signed)
   01/16/2018 1000  Clinical Encounter Type  Visited With Patient and family together  Visit Type ED  Responded to ER for CPR in progress for Patient. Patient family arrived and was taken to consult B. Chaplain went with Dr to inform family that patient has expired. Waited with family and provided spiritual support as they said good bye to their love one. Gather information from spouse for address and phone number and gave to nurse in charge. Gave family patient placement card to call about funeral home. Marilynn Latino

## 2018-01-26 NOTE — ED Notes (Signed)
Pt's family at the bedside. 

## 2018-01-26 DEATH — deceased

## 2018-02-08 ENCOUNTER — Encounter (HOSPITAL_COMMUNITY): Payer: Managed Care, Other (non HMO)

## 2018-02-08 ENCOUNTER — Encounter: Payer: Managed Care, Other (non HMO) | Admitting: Vascular Surgery

## 2018-03-03 ENCOUNTER — Ambulatory Visit: Payer: Managed Care, Other (non HMO) | Admitting: Physical Medicine & Rehabilitation

## 2018-05-23 IMAGING — CR DG TOE GREAT 2+V*R*
3 series · 3 of 3 positions shown · non-contrast
Comparison: Right foot series 09/12/2015

CLINICAL DATA: 56-year-old male with diabetic ulcer of the right
great toe.

EXAM:
RIGHT GREAT TOE

[t toes ap right]
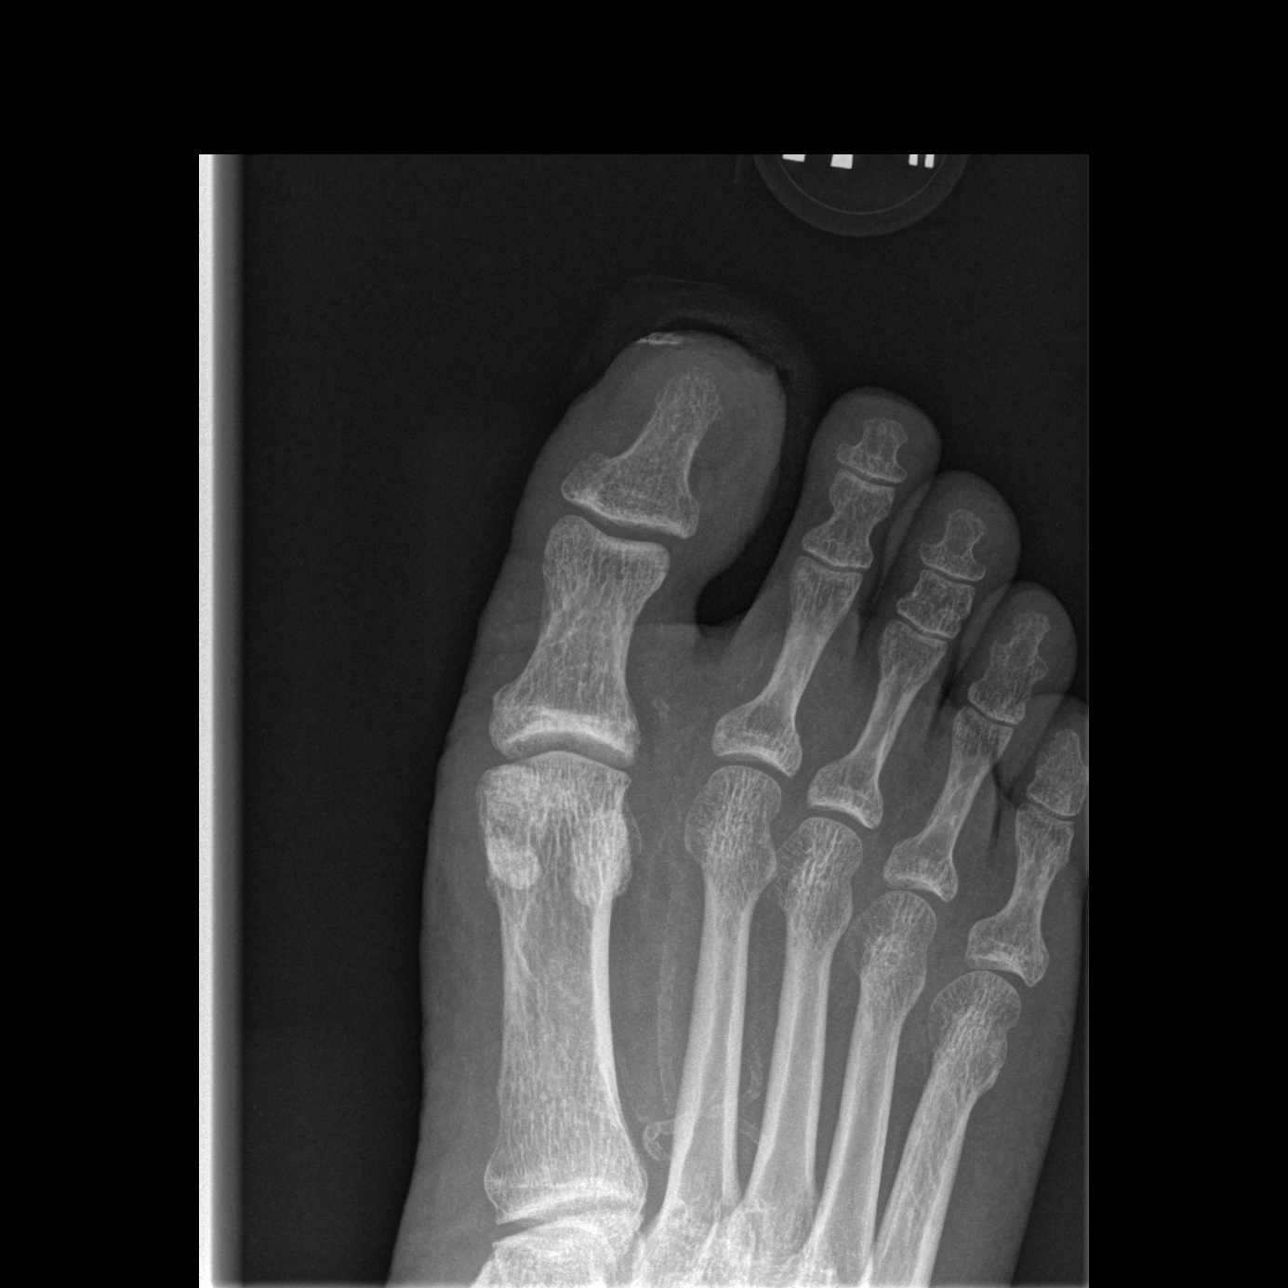

[t toes oblique right]
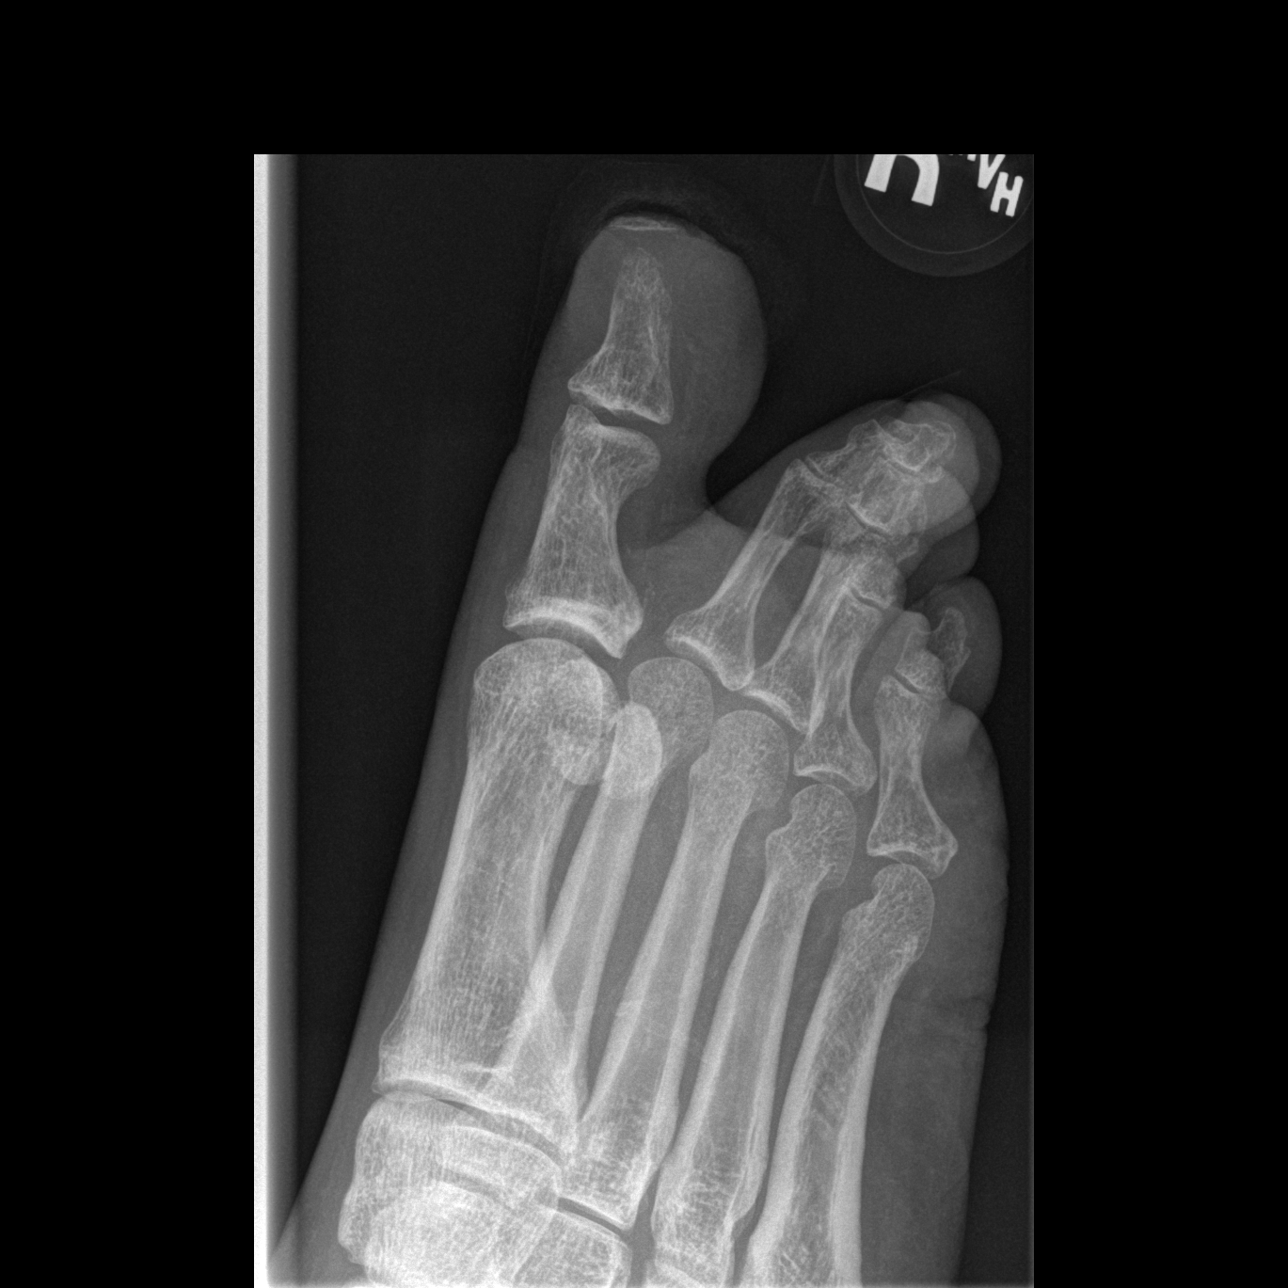

[t toes lateral right]
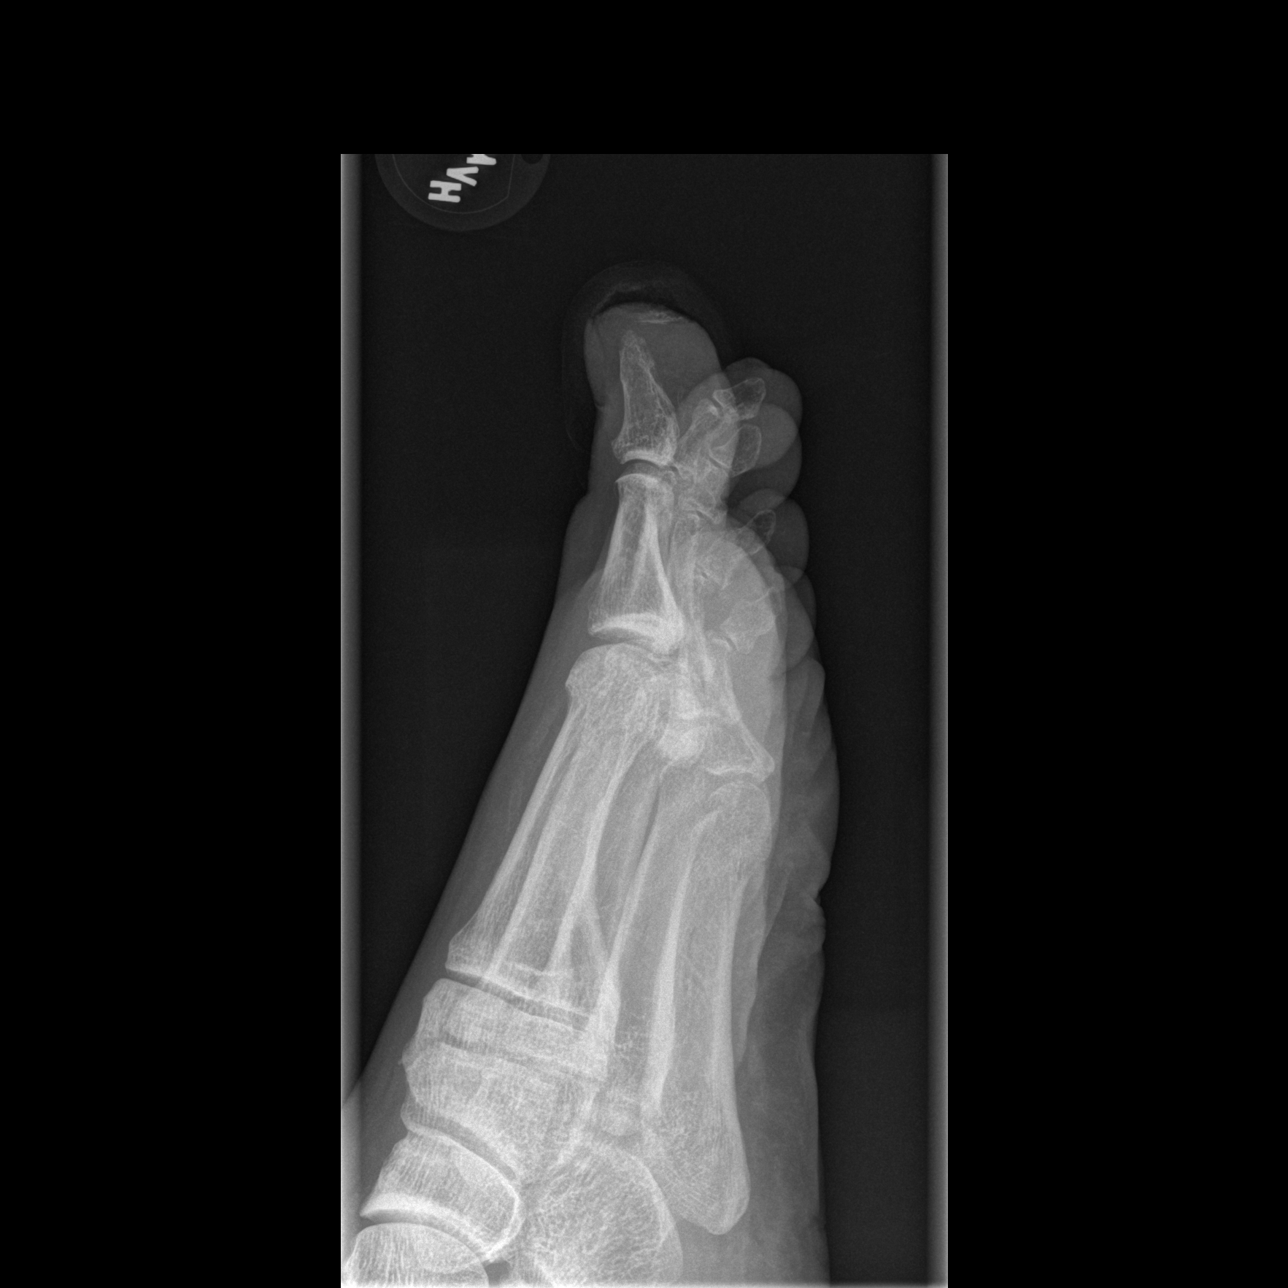

[3 of 3 positions shown; findings below may reference images not displayed]

FINDINGS: Soft tissue wound at the distal aspect of the right great toe is
noted. On comparison with the 1027 right foot series there is
cortical osteolysis about the tuft of the right 1st distal phalanx
in keeping with osteomyelitis. No soft tissue gas. The right 1st IP
joint remains intact. Other visible osseous structures appear
intact. Calcified peripheral vascular disease.
IMPRESSION: Positive for Osteomyelitis of the tuft of the right great toe distal
phalanx.

These results will be called to the ordering clinician or
representative by the Radiologist Assistant, and communication
documented in the PACS or zVision Dashboard.

## 2018-12-19 ENCOUNTER — Encounter: Payer: Self-pay | Admitting: Physical Therapy

## 2018-12-19 NOTE — Therapy (Signed)
[facility] [address] [place], [place], [address] Phone: [phone]   Fax:  [phone]  Patient Details  Name: Keith Guzman MRN: [id number] Date of Birth: 19-May-1961 Referring Provider:  Meridee Score, MD  Encounter Date: 12/19/2018  PHYSICAL THERAPY DISCHARGE SUMMARY  Visits from Start of Care: 15  Current functional level related to goals / functional outcomes: See last PT note on 12/31/2017   Remaining deficits: See last PT note on 12/31/2017   Education / Equipment: Prosthetic care Plan: Patient agrees to discharge.  Patient goals were not met. Patient is being discharged due to not returning since the last visit.  ?????         Brodin Gelpi PT, DPT 12/19/2018, 2:54 PM  [facility] [address] [place], [place], [address] Phone: [phone]   Fax:  [phone]
